# Patient Record
Sex: Male | Born: 1955 | Race: White | Hispanic: No | Smoking: Current every day smoker
Health system: Southern US, Community
[De-identification: ages and names within clinical notes are randomized; demographics above are authoritative.]

## PROBLEM LIST (undated history)

## (undated) DIAGNOSIS — K589 Irritable bowel syndrome without diarrhea: Secondary | ICD-10-CM

## (undated) DIAGNOSIS — F32A Depression, unspecified: Secondary | ICD-10-CM

## (undated) DIAGNOSIS — Z8719 Personal history of other diseases of the digestive system: Secondary | ICD-10-CM

## (undated) DIAGNOSIS — G4733 Obstructive sleep apnea (adult) (pediatric): Secondary | ICD-10-CM

## (undated) DIAGNOSIS — J45909 Unspecified asthma, uncomplicated: Secondary | ICD-10-CM

## (undated) DIAGNOSIS — M1990 Unspecified osteoarthritis, unspecified site: Secondary | ICD-10-CM

## (undated) DIAGNOSIS — F419 Anxiety disorder, unspecified: Secondary | ICD-10-CM

## (undated) DIAGNOSIS — E7800 Pure hypercholesterolemia, unspecified: Secondary | ICD-10-CM

## (undated) DIAGNOSIS — Z87442 Personal history of urinary calculi: Secondary | ICD-10-CM

## (undated) DIAGNOSIS — K219 Gastro-esophageal reflux disease without esophagitis: Secondary | ICD-10-CM

## (undated) HISTORY — DX: Anxiety disorder, unspecified: F41.9

## (undated) HISTORY — DX: Pure hypercholesterolemia, unspecified: E78.00

## (undated) HISTORY — DX: Gastro-esophageal reflux disease without esophagitis: K21.9

## (undated) HISTORY — PX: FEMUR FRACTURE SURGERY: SHX633

## (undated) HISTORY — DX: Obstructive sleep apnea (adult) (pediatric): G47.33

---

## 1994-05-19 HISTORY — PX: KIDNEY STONE SURGERY: SHX686

## 1996-05-19 HISTORY — PX: ANKLE SURGERY: SHX546

## 2012-05-21 HISTORY — PX: OTHER SURGICAL HISTORY: SHX169

## 2012-09-27 ENCOUNTER — Encounter: Payer: Self-pay | Admitting: Neurology

## 2012-09-27 ENCOUNTER — Ambulatory Visit: Payer: Self-pay | Admitting: Neurology

## 2018-03-09 ENCOUNTER — Other Ambulatory Visit: Payer: Self-pay

## 2018-03-09 DIAGNOSIS — R9431 Abnormal electrocardiogram [ECG] [EKG]: Secondary | ICD-10-CM

## 2018-06-23 DIAGNOSIS — J309 Allergic rhinitis, unspecified: Secondary | ICD-10-CM | POA: Diagnosis not present

## 2018-06-23 DIAGNOSIS — J0100 Acute maxillary sinusitis, unspecified: Secondary | ICD-10-CM | POA: Diagnosis not present

## 2018-06-26 DIAGNOSIS — R0789 Other chest pain: Secondary | ICD-10-CM | POA: Diagnosis not present

## 2018-06-26 DIAGNOSIS — I252 Old myocardial infarction: Secondary | ICD-10-CM | POA: Diagnosis not present

## 2018-06-26 DIAGNOSIS — E7800 Pure hypercholesterolemia, unspecified: Secondary | ICD-10-CM | POA: Diagnosis not present

## 2018-06-26 DIAGNOSIS — R079 Chest pain, unspecified: Secondary | ICD-10-CM | POA: Diagnosis not present

## 2018-06-26 DIAGNOSIS — K7689 Other specified diseases of liver: Secondary | ICD-10-CM | POA: Diagnosis not present

## 2018-06-26 DIAGNOSIS — F17210 Nicotine dependence, cigarettes, uncomplicated: Secondary | ICD-10-CM | POA: Diagnosis not present

## 2018-07-19 DIAGNOSIS — K140 Glossitis: Secondary | ICD-10-CM | POA: Diagnosis not present

## 2018-07-19 DIAGNOSIS — F17200 Nicotine dependence, unspecified, uncomplicated: Secondary | ICD-10-CM | POA: Diagnosis not present

## 2018-07-19 DIAGNOSIS — J342 Deviated nasal septum: Secondary | ICD-10-CM | POA: Diagnosis not present

## 2018-07-19 DIAGNOSIS — H6123 Impacted cerumen, bilateral: Secondary | ICD-10-CM | POA: Diagnosis not present

## 2018-12-02 DIAGNOSIS — Z125 Encounter for screening for malignant neoplasm of prostate: Secondary | ICD-10-CM | POA: Diagnosis not present

## 2018-12-02 DIAGNOSIS — Z9119 Patient's noncompliance with other medical treatment and regimen: Secondary | ICD-10-CM | POA: Diagnosis not present

## 2018-12-02 DIAGNOSIS — Z6841 Body Mass Index (BMI) 40.0 and over, adult: Secondary | ICD-10-CM | POA: Diagnosis not present

## 2018-12-02 DIAGNOSIS — I10 Essential (primary) hypertension: Secondary | ICD-10-CM | POA: Diagnosis not present

## 2018-12-02 DIAGNOSIS — Z79899 Other long term (current) drug therapy: Secondary | ICD-10-CM | POA: Diagnosis not present

## 2018-12-02 DIAGNOSIS — E785 Hyperlipidemia, unspecified: Secondary | ICD-10-CM | POA: Diagnosis not present

## 2018-12-02 DIAGNOSIS — R5383 Other fatigue: Secondary | ICD-10-CM | POA: Diagnosis not present

## 2019-02-22 DIAGNOSIS — L089 Local infection of the skin and subcutaneous tissue, unspecified: Secondary | ICD-10-CM | POA: Diagnosis not present

## 2019-02-22 DIAGNOSIS — L723 Sebaceous cyst: Secondary | ICD-10-CM | POA: Diagnosis not present

## 2019-02-22 DIAGNOSIS — K589 Irritable bowel syndrome without diarrhea: Secondary | ICD-10-CM | POA: Diagnosis not present

## 2019-02-22 DIAGNOSIS — Z6841 Body Mass Index (BMI) 40.0 and over, adult: Secondary | ICD-10-CM | POA: Diagnosis not present

## 2019-07-06 DIAGNOSIS — N3001 Acute cystitis with hematuria: Secondary | ICD-10-CM | POA: Diagnosis not present

## 2019-07-06 DIAGNOSIS — N3090 Cystitis, unspecified without hematuria: Secondary | ICD-10-CM | POA: Diagnosis not present

## 2019-08-22 DIAGNOSIS — Z6841 Body Mass Index (BMI) 40.0 and over, adult: Secondary | ICD-10-CM | POA: Diagnosis not present

## 2019-08-22 DIAGNOSIS — Z0000 Encounter for general adult medical examination without abnormal findings: Secondary | ICD-10-CM | POA: Diagnosis not present

## 2019-08-22 DIAGNOSIS — Z125 Encounter for screening for malignant neoplasm of prostate: Secondary | ICD-10-CM | POA: Diagnosis not present

## 2019-08-22 DIAGNOSIS — Z79899 Other long term (current) drug therapy: Secondary | ICD-10-CM | POA: Diagnosis not present

## 2019-08-25 DIAGNOSIS — E049 Nontoxic goiter, unspecified: Secondary | ICD-10-CM | POA: Diagnosis not present

## 2019-08-25 DIAGNOSIS — E041 Nontoxic single thyroid nodule: Secondary | ICD-10-CM | POA: Diagnosis not present

## 2019-08-25 DIAGNOSIS — Z1329 Encounter for screening for other suspected endocrine disorder: Secondary | ICD-10-CM | POA: Diagnosis not present

## 2019-09-20 DIAGNOSIS — E041 Nontoxic single thyroid nodule: Secondary | ICD-10-CM | POA: Diagnosis not present

## 2019-09-20 DIAGNOSIS — R739 Hyperglycemia, unspecified: Secondary | ICD-10-CM | POA: Diagnosis not present

## 2019-09-20 DIAGNOSIS — F419 Anxiety disorder, unspecified: Secondary | ICD-10-CM | POA: Diagnosis not present

## 2019-09-20 DIAGNOSIS — Z6841 Body Mass Index (BMI) 40.0 and over, adult: Secondary | ICD-10-CM | POA: Diagnosis not present

## 2019-10-25 DIAGNOSIS — F17210 Nicotine dependence, cigarettes, uncomplicated: Secondary | ICD-10-CM | POA: Diagnosis not present

## 2019-10-25 DIAGNOSIS — G4733 Obstructive sleep apnea (adult) (pediatric): Secondary | ICD-10-CM | POA: Diagnosis not present

## 2019-10-25 DIAGNOSIS — E662 Morbid (severe) obesity with alveolar hypoventilation: Secondary | ICD-10-CM | POA: Diagnosis not present

## 2019-10-25 DIAGNOSIS — R5383 Other fatigue: Secondary | ICD-10-CM | POA: Diagnosis not present

## 2019-11-06 DIAGNOSIS — G4733 Obstructive sleep apnea (adult) (pediatric): Secondary | ICD-10-CM | POA: Diagnosis not present

## 2019-11-09 DIAGNOSIS — F17210 Nicotine dependence, cigarettes, uncomplicated: Secondary | ICD-10-CM | POA: Diagnosis not present

## 2019-11-09 DIAGNOSIS — R5383 Other fatigue: Secondary | ICD-10-CM | POA: Diagnosis not present

## 2019-11-09 DIAGNOSIS — E662 Morbid (severe) obesity with alveolar hypoventilation: Secondary | ICD-10-CM | POA: Diagnosis not present

## 2019-11-09 DIAGNOSIS — G4733 Obstructive sleep apnea (adult) (pediatric): Secondary | ICD-10-CM | POA: Diagnosis not present

## 2019-12-05 DIAGNOSIS — G4733 Obstructive sleep apnea (adult) (pediatric): Secondary | ICD-10-CM | POA: Diagnosis not present

## 2019-12-22 DIAGNOSIS — R233 Spontaneous ecchymoses: Secondary | ICD-10-CM | POA: Diagnosis not present

## 2019-12-22 DIAGNOSIS — L821 Other seborrheic keratosis: Secondary | ICD-10-CM | POA: Diagnosis not present

## 2019-12-26 DIAGNOSIS — Z6841 Body Mass Index (BMI) 40.0 and over, adult: Secondary | ICD-10-CM | POA: Diagnosis not present

## 2019-12-26 DIAGNOSIS — K580 Irritable bowel syndrome with diarrhea: Secondary | ICD-10-CM | POA: Diagnosis not present

## 2019-12-26 DIAGNOSIS — R739 Hyperglycemia, unspecified: Secondary | ICD-10-CM | POA: Diagnosis not present

## 2019-12-30 DIAGNOSIS — G4733 Obstructive sleep apnea (adult) (pediatric): Secondary | ICD-10-CM | POA: Diagnosis not present

## 2020-01-05 DIAGNOSIS — G4733 Obstructive sleep apnea (adult) (pediatric): Secondary | ICD-10-CM | POA: Diagnosis not present

## 2020-01-16 DIAGNOSIS — R5383 Other fatigue: Secondary | ICD-10-CM | POA: Diagnosis not present

## 2020-01-16 DIAGNOSIS — E662 Morbid (severe) obesity with alveolar hypoventilation: Secondary | ICD-10-CM | POA: Diagnosis not present

## 2020-01-16 DIAGNOSIS — G4733 Obstructive sleep apnea (adult) (pediatric): Secondary | ICD-10-CM | POA: Diagnosis not present

## 2020-01-16 DIAGNOSIS — F17210 Nicotine dependence, cigarettes, uncomplicated: Secondary | ICD-10-CM | POA: Diagnosis not present

## 2020-01-16 DIAGNOSIS — J4520 Mild intermittent asthma, uncomplicated: Secondary | ICD-10-CM | POA: Diagnosis not present

## 2020-02-02 DIAGNOSIS — K5730 Diverticulosis of large intestine without perforation or abscess without bleeding: Secondary | ICD-10-CM | POA: Diagnosis not present

## 2020-02-05 DIAGNOSIS — G4733 Obstructive sleep apnea (adult) (pediatric): Secondary | ICD-10-CM | POA: Diagnosis not present

## 2020-02-24 DIAGNOSIS — R5383 Other fatigue: Secondary | ICD-10-CM | POA: Diagnosis not present

## 2020-02-24 DIAGNOSIS — E662 Morbid (severe) obesity with alveolar hypoventilation: Secondary | ICD-10-CM | POA: Diagnosis not present

## 2020-02-24 DIAGNOSIS — F17210 Nicotine dependence, cigarettes, uncomplicated: Secondary | ICD-10-CM | POA: Diagnosis not present

## 2020-02-24 DIAGNOSIS — R059 Cough, unspecified: Secondary | ICD-10-CM | POA: Diagnosis not present

## 2020-02-24 DIAGNOSIS — G4733 Obstructive sleep apnea (adult) (pediatric): Secondary | ICD-10-CM | POA: Diagnosis not present

## 2020-02-24 DIAGNOSIS — J4520 Mild intermittent asthma, uncomplicated: Secondary | ICD-10-CM | POA: Diagnosis not present

## 2020-03-06 DIAGNOSIS — G4733 Obstructive sleep apnea (adult) (pediatric): Secondary | ICD-10-CM | POA: Diagnosis not present

## 2020-03-15 DIAGNOSIS — K5730 Diverticulosis of large intestine without perforation or abscess without bleeding: Secondary | ICD-10-CM | POA: Diagnosis not present

## 2020-03-28 DIAGNOSIS — R059 Cough, unspecified: Secondary | ICD-10-CM | POA: Diagnosis not present

## 2020-04-06 DIAGNOSIS — G4733 Obstructive sleep apnea (adult) (pediatric): Secondary | ICD-10-CM | POA: Diagnosis not present

## 2020-04-09 DIAGNOSIS — Z7189 Other specified counseling: Secondary | ICD-10-CM | POA: Diagnosis not present

## 2020-04-09 DIAGNOSIS — F17210 Nicotine dependence, cigarettes, uncomplicated: Secondary | ICD-10-CM | POA: Diagnosis not present

## 2020-04-09 DIAGNOSIS — E662 Morbid (severe) obesity with alveolar hypoventilation: Secondary | ICD-10-CM | POA: Diagnosis not present

## 2020-04-09 DIAGNOSIS — G4733 Obstructive sleep apnea (adult) (pediatric): Secondary | ICD-10-CM | POA: Diagnosis not present

## 2020-04-09 DIAGNOSIS — J4520 Mild intermittent asthma, uncomplicated: Secondary | ICD-10-CM | POA: Diagnosis not present

## 2020-04-09 DIAGNOSIS — R5383 Other fatigue: Secondary | ICD-10-CM | POA: Diagnosis not present

## 2020-05-06 DIAGNOSIS — G4733 Obstructive sleep apnea (adult) (pediatric): Secondary | ICD-10-CM | POA: Diagnosis not present

## 2020-05-22 DIAGNOSIS — G4733 Obstructive sleep apnea (adult) (pediatric): Secondary | ICD-10-CM | POA: Diagnosis not present

## 2020-05-22 DIAGNOSIS — F17210 Nicotine dependence, cigarettes, uncomplicated: Secondary | ICD-10-CM | POA: Diagnosis not present

## 2020-05-22 DIAGNOSIS — J4540 Moderate persistent asthma, uncomplicated: Secondary | ICD-10-CM | POA: Diagnosis not present

## 2020-05-22 DIAGNOSIS — E662 Morbid (severe) obesity with alveolar hypoventilation: Secondary | ICD-10-CM | POA: Diagnosis not present

## 2020-05-22 DIAGNOSIS — R5383 Other fatigue: Secondary | ICD-10-CM | POA: Diagnosis not present

## 2020-05-22 DIAGNOSIS — Z7189 Other specified counseling: Secondary | ICD-10-CM | POA: Diagnosis not present

## 2020-06-06 DIAGNOSIS — G4733 Obstructive sleep apnea (adult) (pediatric): Secondary | ICD-10-CM | POA: Diagnosis not present

## 2020-07-07 DIAGNOSIS — G4733 Obstructive sleep apnea (adult) (pediatric): Secondary | ICD-10-CM | POA: Diagnosis not present

## 2020-07-17 DIAGNOSIS — C9030 Solitary plasmacytoma not having achieved remission: Secondary | ICD-10-CM

## 2020-07-17 HISTORY — DX: Solitary plasmacytoma not having achieved remission: C90.30

## 2020-08-04 DIAGNOSIS — G4733 Obstructive sleep apnea (adult) (pediatric): Secondary | ICD-10-CM | POA: Diagnosis not present

## 2020-08-17 DIAGNOSIS — S39012A Strain of muscle, fascia and tendon of lower back, initial encounter: Secondary | ICD-10-CM | POA: Diagnosis not present

## 2020-08-17 DIAGNOSIS — Z6841 Body Mass Index (BMI) 40.0 and over, adult: Secondary | ICD-10-CM | POA: Diagnosis not present

## 2020-08-21 DIAGNOSIS — F17210 Nicotine dependence, cigarettes, uncomplicated: Secondary | ICD-10-CM | POA: Diagnosis not present

## 2020-08-21 DIAGNOSIS — M545 Low back pain, unspecified: Secondary | ICD-10-CM | POA: Diagnosis not present

## 2020-08-21 DIAGNOSIS — Z7952 Long term (current) use of systemic steroids: Secondary | ICD-10-CM | POA: Diagnosis not present

## 2020-08-21 DIAGNOSIS — E876 Hypokalemia: Secondary | ICD-10-CM | POA: Diagnosis not present

## 2020-08-21 DIAGNOSIS — Z79891 Long term (current) use of opiate analgesic: Secondary | ICD-10-CM | POA: Diagnosis not present

## 2020-08-21 DIAGNOSIS — N390 Urinary tract infection, site not specified: Secondary | ICD-10-CM | POA: Diagnosis not present

## 2020-08-21 DIAGNOSIS — X58XXXA Exposure to other specified factors, initial encounter: Secondary | ICD-10-CM | POA: Diagnosis not present

## 2020-08-21 DIAGNOSIS — E785 Hyperlipidemia, unspecified: Secondary | ICD-10-CM | POA: Diagnosis not present

## 2020-08-21 DIAGNOSIS — S3992XA Unspecified injury of lower back, initial encounter: Secondary | ICD-10-CM | POA: Diagnosis not present

## 2020-08-21 DIAGNOSIS — Z79899 Other long term (current) drug therapy: Secondary | ICD-10-CM | POA: Diagnosis not present

## 2020-08-21 DIAGNOSIS — S39012A Strain of muscle, fascia and tendon of lower back, initial encounter: Secondary | ICD-10-CM | POA: Diagnosis not present

## 2020-08-21 DIAGNOSIS — J45909 Unspecified asthma, uncomplicated: Secondary | ICD-10-CM | POA: Diagnosis not present

## 2020-08-23 DIAGNOSIS — M47896 Other spondylosis, lumbar region: Secondary | ICD-10-CM | POA: Diagnosis not present

## 2020-08-23 DIAGNOSIS — M545 Low back pain, unspecified: Secondary | ICD-10-CM | POA: Diagnosis not present

## 2020-08-23 DIAGNOSIS — M7918 Myalgia, other site: Secondary | ICD-10-CM | POA: Diagnosis not present

## 2020-08-29 DIAGNOSIS — R351 Nocturia: Secondary | ICD-10-CM | POA: Diagnosis not present

## 2020-08-29 DIAGNOSIS — R3121 Asymptomatic microscopic hematuria: Secondary | ICD-10-CM | POA: Diagnosis not present

## 2020-08-29 DIAGNOSIS — R350 Frequency of micturition: Secondary | ICD-10-CM | POA: Diagnosis not present

## 2020-09-04 DIAGNOSIS — G4733 Obstructive sleep apnea (adult) (pediatric): Secondary | ICD-10-CM | POA: Diagnosis not present

## 2020-09-07 DIAGNOSIS — N281 Cyst of kidney, acquired: Secondary | ICD-10-CM | POA: Diagnosis not present

## 2020-09-07 DIAGNOSIS — R3121 Asymptomatic microscopic hematuria: Secondary | ICD-10-CM | POA: Diagnosis not present

## 2020-09-07 DIAGNOSIS — N200 Calculus of kidney: Secondary | ICD-10-CM | POA: Diagnosis not present

## 2020-09-07 DIAGNOSIS — K5730 Diverticulosis of large intestine without perforation or abscess without bleeding: Secondary | ICD-10-CM | POA: Diagnosis not present

## 2020-09-07 DIAGNOSIS — K760 Fatty (change of) liver, not elsewhere classified: Secondary | ICD-10-CM | POA: Diagnosis not present

## 2020-09-10 ENCOUNTER — Other Ambulatory Visit: Payer: Self-pay | Admitting: Urology

## 2020-09-10 ENCOUNTER — Other Ambulatory Visit (HOSPITAL_COMMUNITY): Payer: Self-pay | Admitting: Urology

## 2020-09-10 ENCOUNTER — Ambulatory Visit (HOSPITAL_COMMUNITY)
Admission: RE | Admit: 2020-09-10 | Discharge: 2020-09-10 | Disposition: A | Discharge status: Home / Self Care | Payer: BC Managed Care – PPO | Source: Ambulatory Visit | Attending: Urology | Admitting: Urology

## 2020-09-10 ENCOUNTER — Other Ambulatory Visit: Payer: Self-pay

## 2020-09-10 DIAGNOSIS — M4804 Spinal stenosis, thoracic region: Secondary | ICD-10-CM | POA: Diagnosis not present

## 2020-09-10 DIAGNOSIS — G9520 Unspecified cord compression: Secondary | ICD-10-CM | POA: Diagnosis not present

## 2020-09-10 DIAGNOSIS — R9389 Abnormal findings on diagnostic imaging of other specified body structures: Secondary | ICD-10-CM

## 2020-09-10 DIAGNOSIS — M8448XA Pathological fracture, other site, initial encounter for fracture: Secondary | ICD-10-CM | POA: Diagnosis not present

## 2020-09-10 DIAGNOSIS — M5124 Other intervertebral disc displacement, thoracic region: Secondary | ICD-10-CM | POA: Diagnosis not present

## 2020-09-10 IMAGING — MR MR THORACIC SPINE WO/W CM
8 of 9 series · 31 of 48 positions shown · IV contrast (gadavist)
Comparison: CT abdomen and pelvis [DATE]

CLINICAL DATA: Abnormal CT demonstrating a destructive T11 lesion.

EXAM:
MRI THORACIC WITHOUT AND WITH CONTRAST
TECHNIQUE: Multiplanar and multiecho pulse sequences of the thoracic spine were
obtained without and with intravenous contrast.
CONTRAST:  10mL GADAVIST GADOBUTROL 1 MMOL/ML IV SOLN

[Series 16: T1 · sagittal · 4.0mm · 1.72mm/px · 1 of 5 slices shown (1 of 3)]
[im 1/5]
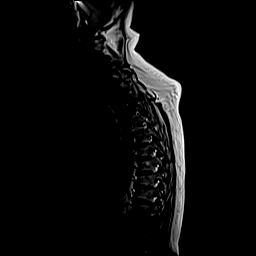

[Series 17: STIR · sagittal · 3.0mm · 1.00mm/px · 2 of 15 slices shown]
[im 1/15]
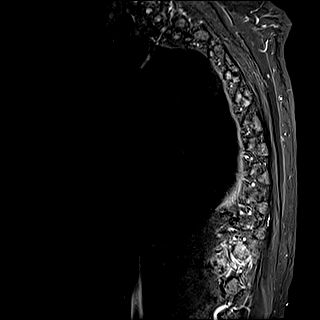
[im 15/15]
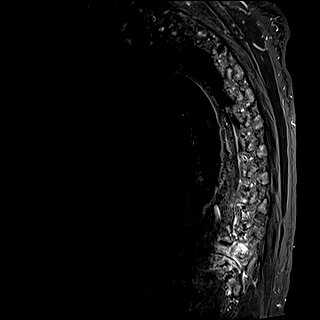

[Series 18: T1 · sagittal · 3.0mm · 1.00mm/px · 3 of 15 slices shown (2 of 3)]
[im 1/15]
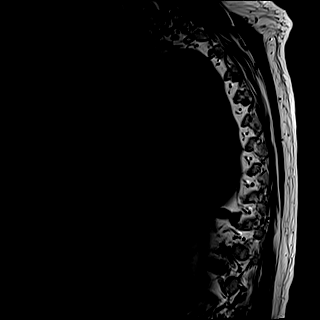
[im 8/15]
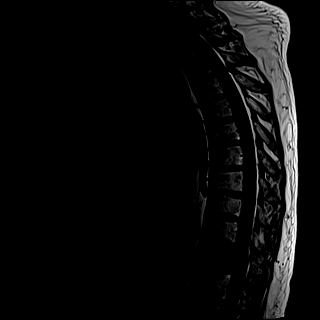
[im 15/15]
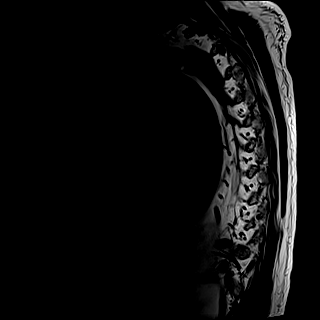

[Series 19: T2 · axial · 4.0mm · 0.78mm/px · z∈[-271,-89]mm · 9 of 40 slices shown]
[im 1/40]
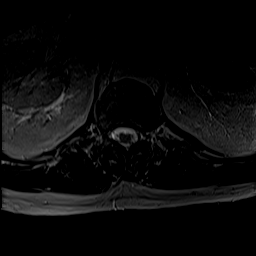
[im 5/40]
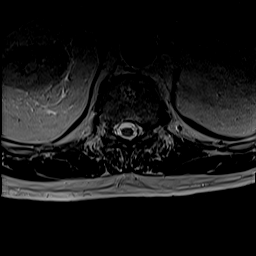
[im 10/40]
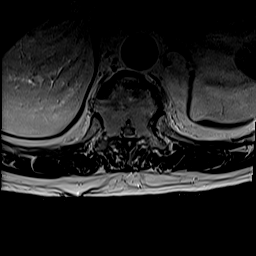
[im 15/40]
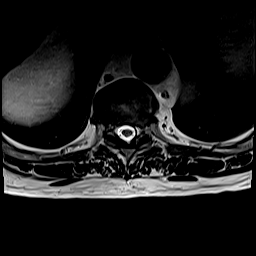
[im 20/40]
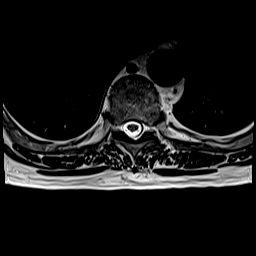
[im 25/40]
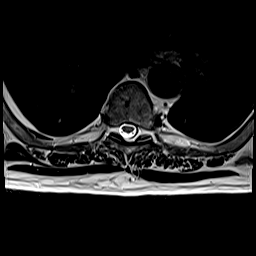
[im 30/40]
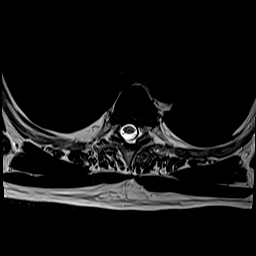
[im 35/40]
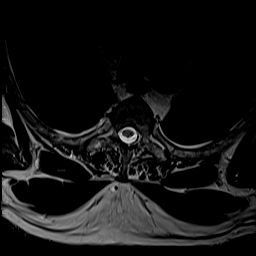
[im 40/40]
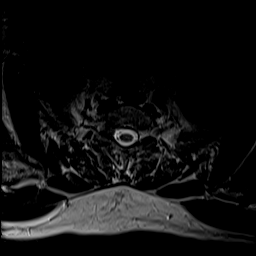

[Series 21: T1 · axial · 4.0mm · 0.39mm/px · z∈[-271,-89]mm · 8 of 40 slices shown (3 of 3)]
[im 1/40]
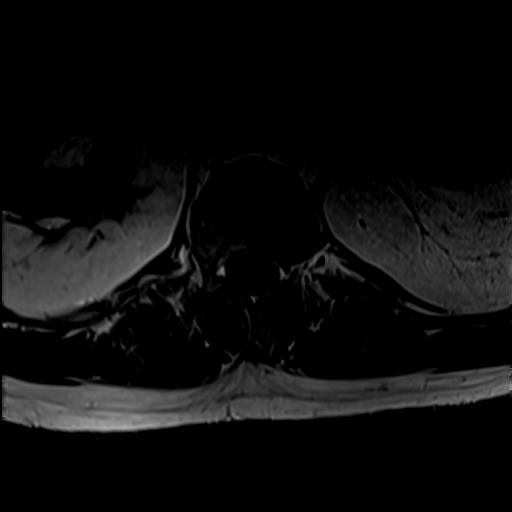
[im 5/40]
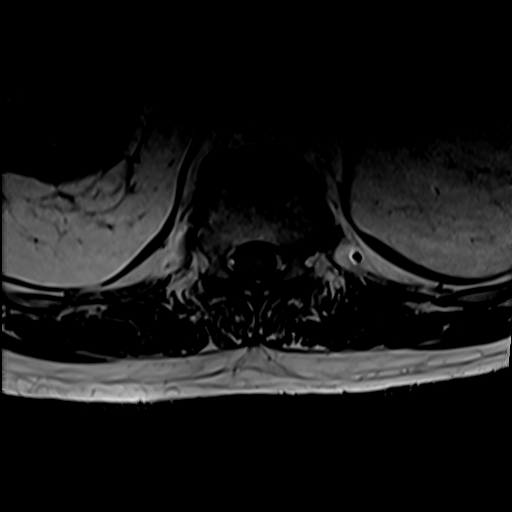
[im 10/40]
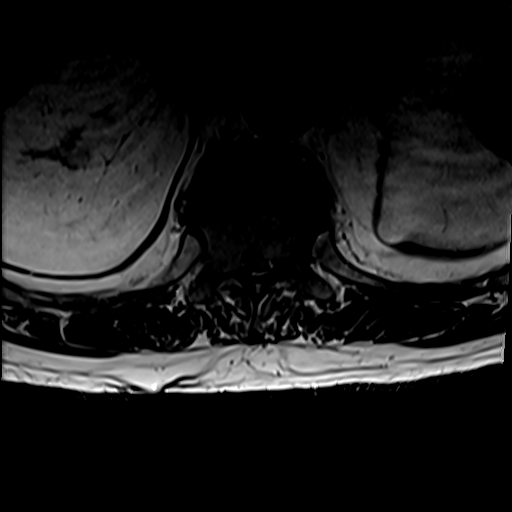
[im 15/40]
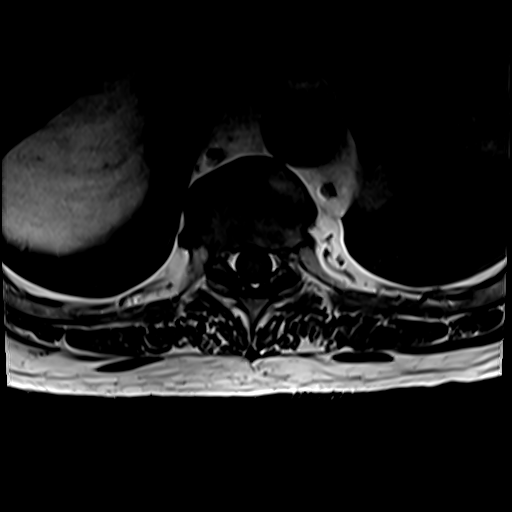
[im 25/40]
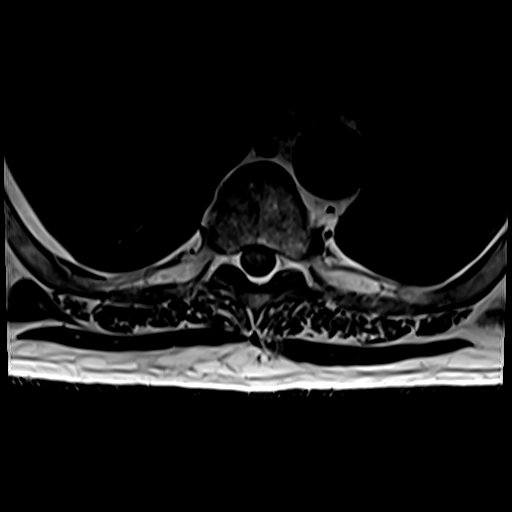
[im 30/40]
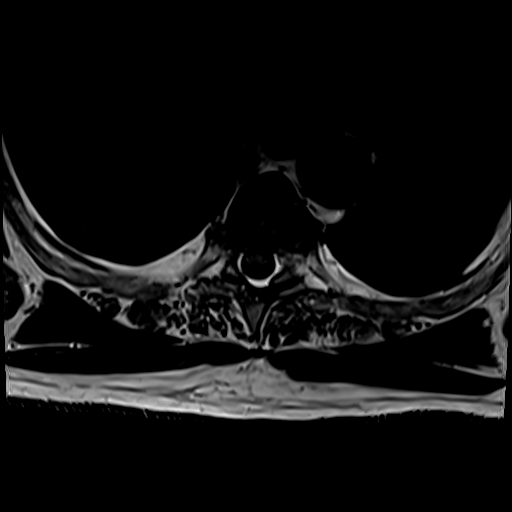
[im 35/40]
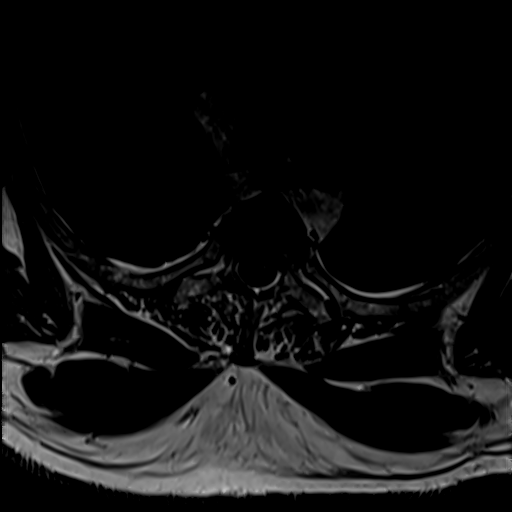
[im 40/40]
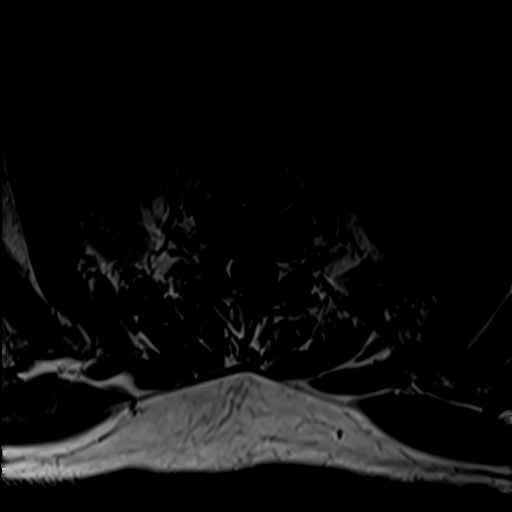

[Series 22: T2 post-contrast · sagittal · 3.0mm · 0.83mm/px · 3 of 15 slices shown]
[im 1/15]
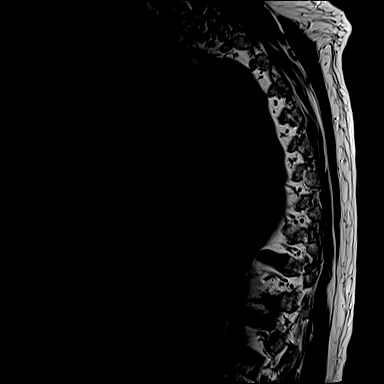
[im 8/15]
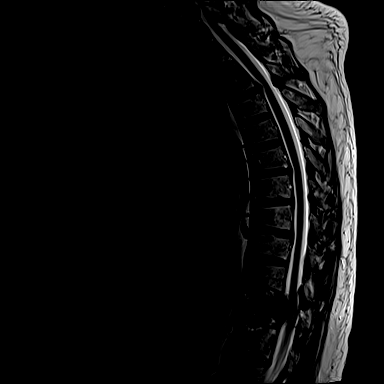
[im 15/15]
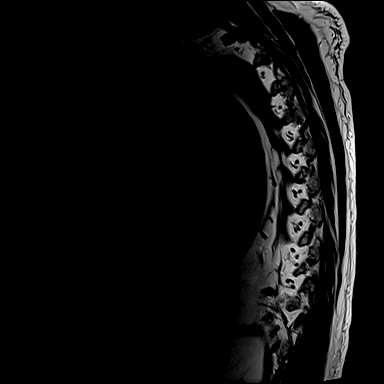

[Series 23: T1 fat-sat post-contrast · sagittal · 3.0mm · 1.00mm/px · 3 of 15 slices shown]
[im 1/15]
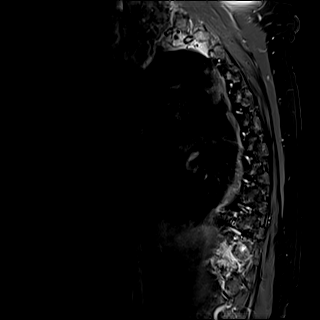
[im 8/15]
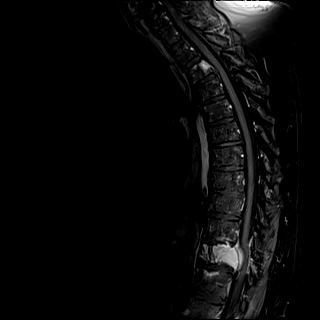
[im 15/15]
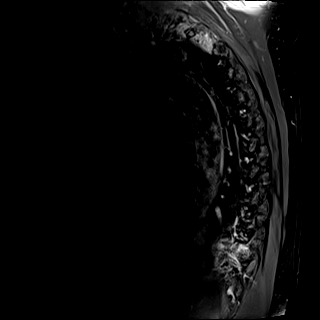

[Series 24: T1 post-contrast · axial · 4.0mm · 0.39mm/px · z∈[-271,-250]mm · 2 of 40 slices shown]
[im 1/40]
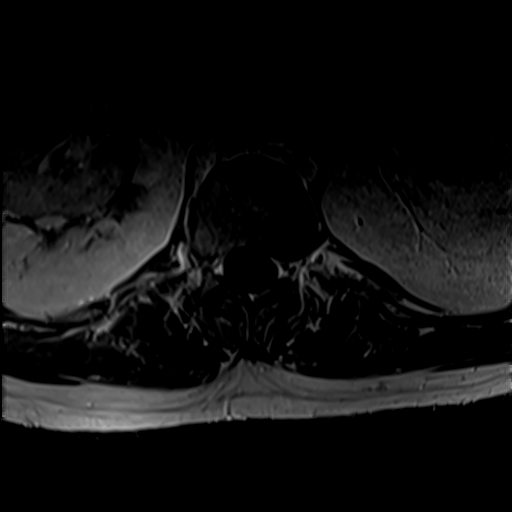
[im 5/40]
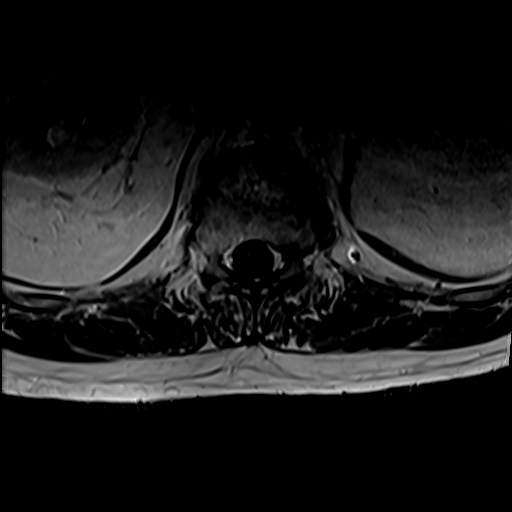

[31 of 48 positions shown; findings below may reference images not displayed]

FINDINGS: Alignment:  Normal.

Vertebrae: As seen on the recent abdominal CT, there is a large
destructive lesion involving the T11 vertebral body and posterior
elements bilaterally with pathologic vertebral body fracture
demonstrating mild height loss. The lesion demonstrates largely
uniform T2 hyperintensity and avid enhancement although there are
some vertical low signal intensity striations anteriorly. There is
prominent epidural tumor ventrally and laterally in the spinal canal
resulting in severe spinal stenosis and mild spinal cord
compression. Smaller enhancing lesions are present in the T3, T10,
T12, and L1 vertebral bodies and in the right-sided T8 posterior
elements.

Cord: Questionable faint T2 hyperintensity in the spinal cord at
T11. Normal cord signal elsewhere.

Paraspinal and other soft tissues: Unremarkable.

Disc levels:

Limited visualization of the cervical spine on sagittal imaging
demonstrates disc bulging at C6-7 resulting in mild spinal stenosis
and suspected moderate bilateral neural foraminal stenosis. A small
central disc protrusion at T3-4 indents the ventral spinal cord
without significant spinal stenosis. Shallow central disc
protrusions at T6-7, T7-8, and T8-9 do not result in significant
spinal cord mass effect or stenosis. T11 tumor encroaches upon the
T11-12 greater than T10-11 neural foramina bilaterally.
IMPRESSION: 1. Large destructive lesion involving the T11 vertebral body and
posterior elements with epidural tumor resulting in severe spinal
stenosis and mild cord compression with possible faint cord edema.
2. Additional smaller enhancing lesions in the thoracic and upper
lumbar spine.
3. Given the appearance of the dominant T11 mass, multiple myeloma
is favored over metastatic disease.

## 2020-09-10 MED ORDER — GADOBUTROL 1 MMOL/ML IV SOLN
10.0000 mL | Freq: Once | INTRAVENOUS | Status: AC | PRN
  Administered 2020-09-10: 10 mL via INTRAVENOUS

## 2020-09-11 DIAGNOSIS — I10 Essential (primary) hypertension: Secondary | ICD-10-CM | POA: Insufficient documentation

## 2020-09-11 DIAGNOSIS — C9030 Solitary plasmacytoma not having achieved remission: Secondary | ICD-10-CM | POA: Diagnosis not present

## 2020-09-13 ENCOUNTER — Other Ambulatory Visit (HOSPITAL_COMMUNITY): Payer: Self-pay | Admitting: Neurosurgery

## 2020-09-13 DIAGNOSIS — C9030 Solitary plasmacytoma not having achieved remission: Secondary | ICD-10-CM

## 2020-09-19 ENCOUNTER — Ambulatory Visit (HOSPITAL_COMMUNITY)
Admission: RE | Admit: 2020-09-19 | Discharge: 2020-09-19 | Disposition: A | Discharge status: Home / Self Care | Payer: BC Managed Care – PPO | Source: Ambulatory Visit | Attending: Neurosurgery | Admitting: Neurosurgery

## 2020-09-19 ENCOUNTER — Other Ambulatory Visit: Payer: Self-pay

## 2020-09-19 DIAGNOSIS — M4802 Spinal stenosis, cervical region: Secondary | ICD-10-CM | POA: Diagnosis not present

## 2020-09-19 DIAGNOSIS — M47812 Spondylosis without myelopathy or radiculopathy, cervical region: Secondary | ICD-10-CM | POA: Diagnosis not present

## 2020-09-19 DIAGNOSIS — C9030 Solitary plasmacytoma not having achieved remission: Secondary | ICD-10-CM

## 2020-09-19 DIAGNOSIS — M5021 Other cervical disc displacement,  high cervical region: Secondary | ICD-10-CM | POA: Diagnosis not present

## 2020-09-19 DIAGNOSIS — M50223 Other cervical disc displacement at C6-C7 level: Secondary | ICD-10-CM | POA: Diagnosis not present

## 2020-09-19 IMAGING — MR MR CERVICAL SPINE WO/W CM
6 of 8 series · 32 of 48 positions shown · IV contrast (gadavist)
Comparison: MRI of the thoracic spine [DATE]

CLINICAL DATA: Solitary plasmacytoma not having achieved remission.

EXAM:
MRI CERVICAL AND LUMBAR SPINE WITHOUT AND WITH CONTRAST
TECHNIQUE: Multiplanar and multiecho pulse sequences of the cervical spine, to
include the craniocervical junction and cervicothoracic junction,
and lumbar spine, were obtained without and with intravenous
contrast.
CONTRAST:  10mL GADAVIST GADOBUTROL 1 MMOL/ML IV SOLN

[Series 5: T1 · sagittal · 3.0mm · 0.69mm/px · 4 of 15 slices shown (1 of 2)]
[im 1/15]
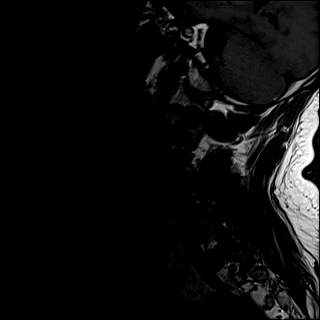
[im 5/15]
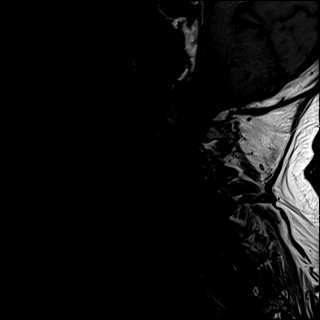
[im 10/15]
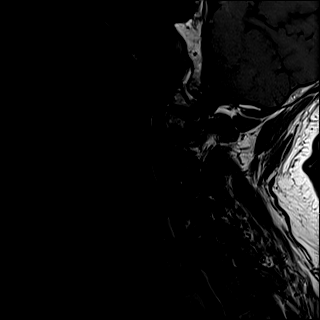
[im 15/15]
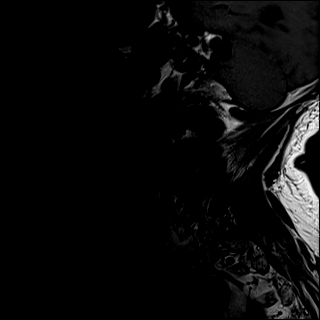

[Series 6: STIR · sagittal · 3.0mm · 0.86mm/px · 4 of 15 slices shown]
[im 1/15]
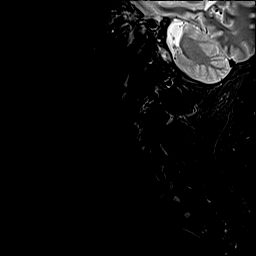
[im 5/15]
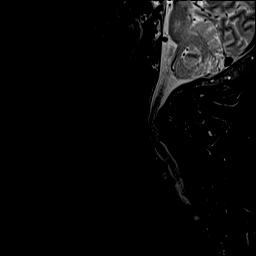
[im 10/15]
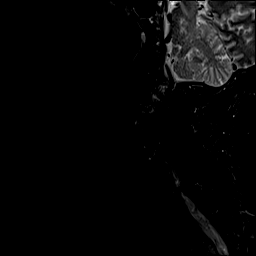
[im 15/15]
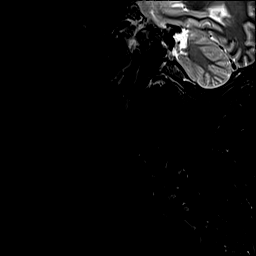

[Series 7: T2 · sagittal · 3.0mm · 0.69mm/px · 4 of 15 slices shown (1 of 2)]
[im 1/15]
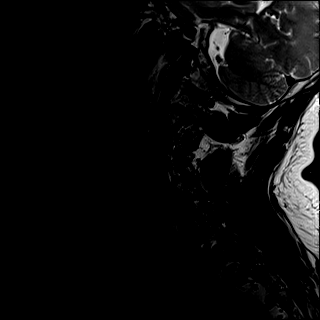
[im 5/15]
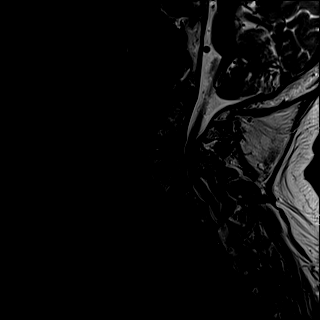
[im 10/15]
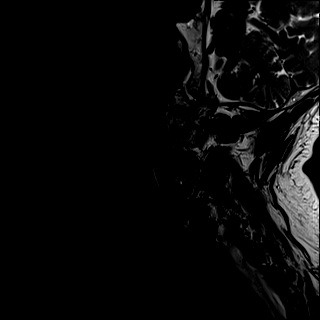
[im 15/15]
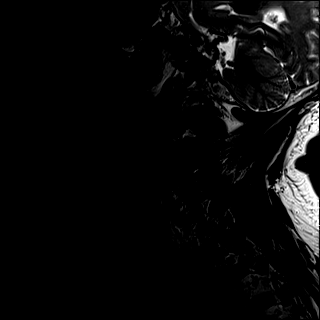

[Series 8: T2 · axial · 3.0mm · 0.70mm/px · z∈[-61,+35]mm · 8 of 33 slices shown (2 of 2)]
[im 1/33]
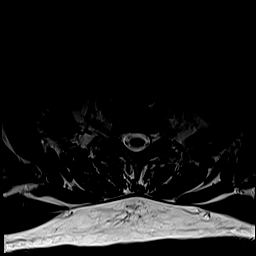
[im 5/33]
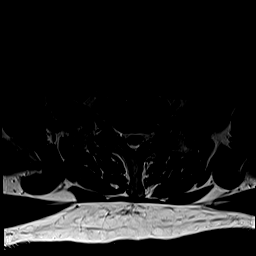
[im 10/33]
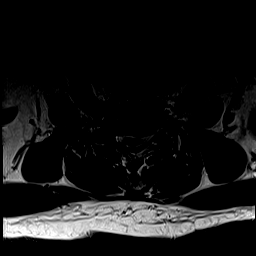
[im 14/33]
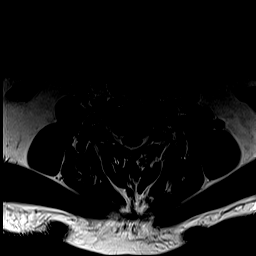
[im 19/33]
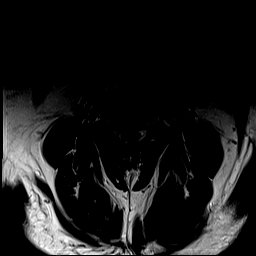
[im 23/33]
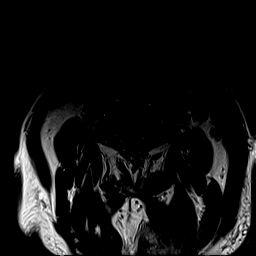
[im 28/33]
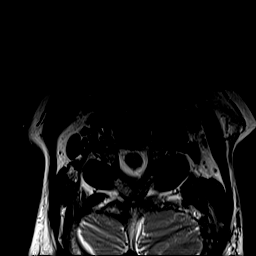
[im 33/33]
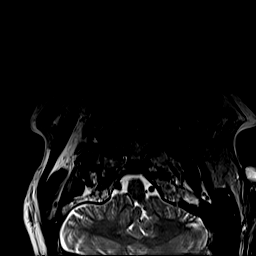

[Series 10: T1 · axial · 3.0mm · 0.35mm/px · z∈[-61,+35]mm · 8 of 33 slices shown (2 of 2)]
[im 1/33]
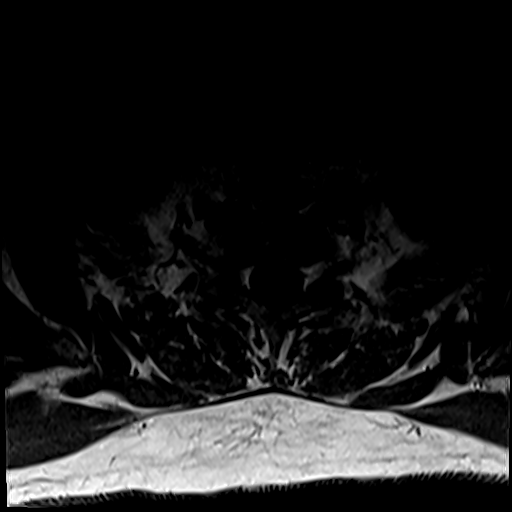
[im 5/33]
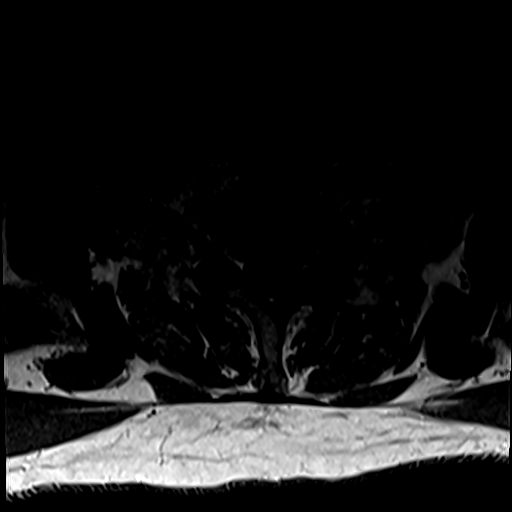
[im 10/33]
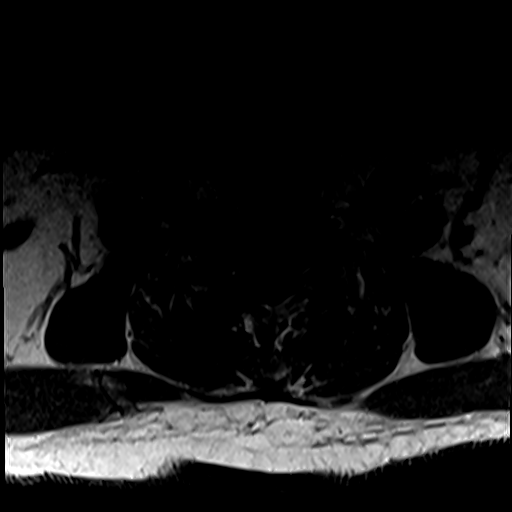
[im 14/33]
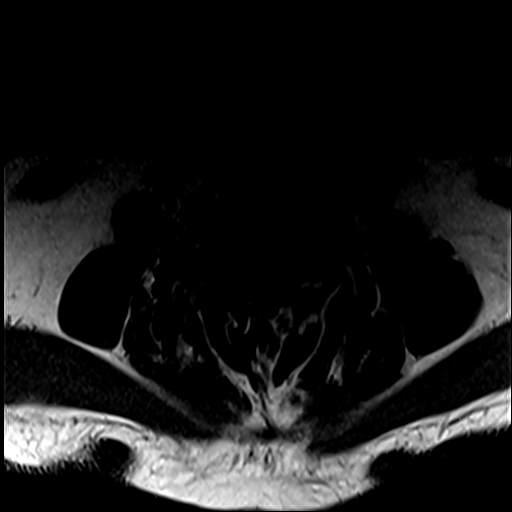
[im 19/33]
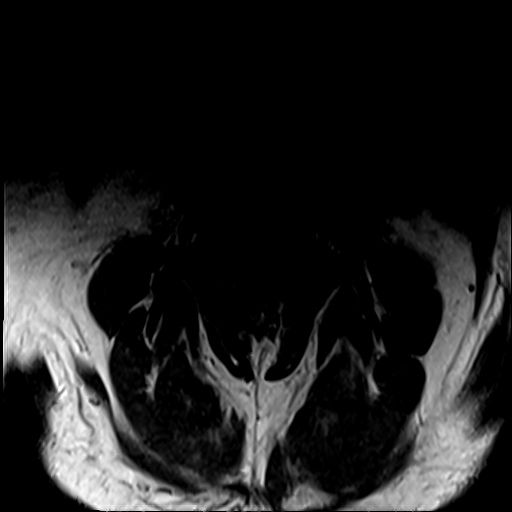
[im 23/33]
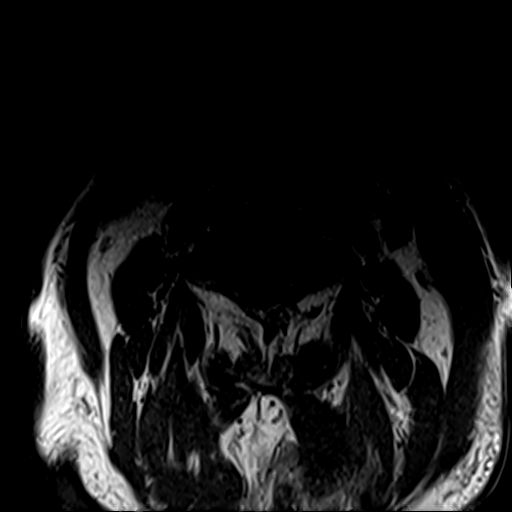
[im 28/33]
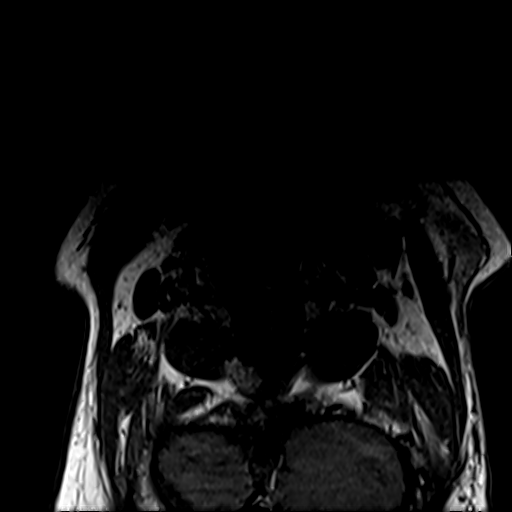
[im 33/33]
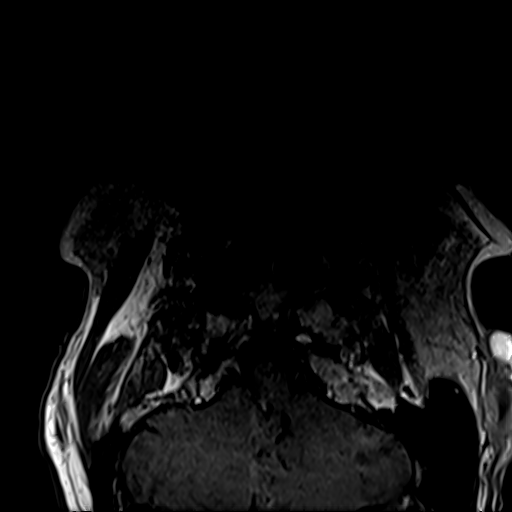

[Series 11: T1 fat-sat post-contrast · sagittal · 3.0mm · 0.69mm/px · 4 of 15 slices shown]
[im 1/15]
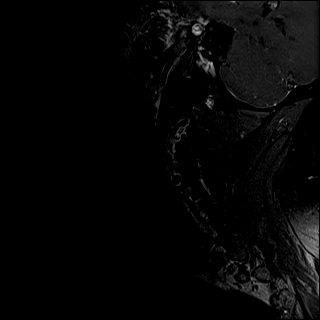
[im 5/15]
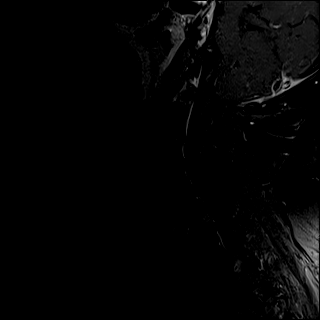
[im 10/15]
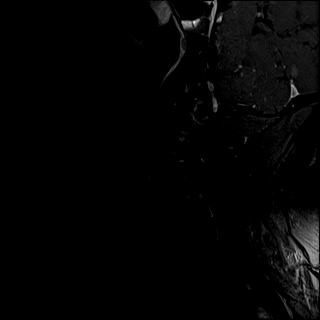
[im 15/15]
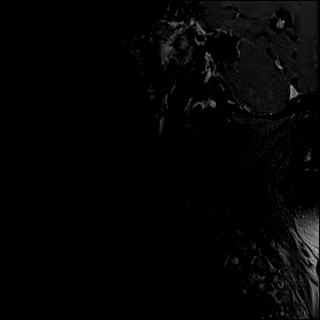

[32 of 48 positions shown; findings below may reference images not displayed]

FINDINGS: MRI CERVICAL SPINE FINDINGS

Alignment: Straightening of the cervical curvature with mild
reversal at the C5-6 level. Small anterolisthesis of C7 over T1.

Vertebrae: No fracture, evidence of discitis, or bone lesion. No
focus of abnormal contrast enhancement.

Cord: Flattening of the anterior cord contour from C3-4 through
C6-7.

Posterior Fossa, vertebral arteries, paraspinal tissues: T2
hyperintensity within the pons, most likely related to chronic
microangiopathy.

Disc levels:

C2-3: Posterior disc protrusion resulting in mild to moderate spinal
canal stenosis. No significant neural foraminal stenosis.

C3-4: Posterior disc protrusion resulting in moderate spinal canal
stenosis. Uncovertebral and facet degenerative changes resulting
severe bilateral neural foraminal narrowing.

C4-5: Posterior disc protrusion resulting in mild to moderate spinal
canal stenosis. Uncovertebral and facet degenerative changes
resulting in mild-to-moderate bilateral neural foraminal narrowing.

C5-6: Posterior disc protrusion resulting in mild spinal canal
stenosis. Uncovertebral and facet degenerative changes resulting in
moderate right and severe left neural foraminal narrowing.

C6-7: Posterior disc protrusion resulting in mild spinal canal
stenosis. Uncovertebral and facet degenerative changes resulting in
moderate bilateral neural foraminal.

C7-T1: Small posterior disc protrusion without significant spinal
canal stenosis. Facet degenerative changes resulting in mild
bilateral neural foraminal narrowing.

MRI LUMBAR SPINE FINDINGS

Segmentation:  Standard.

Alignment:  Physiologic.

Vertebrae: Again seen is large destructive lesion of the T11
vertebral body and extending to the posterior elements with
retropulsion resulting in spinal canal stenosis and compression of
the cord at this level. This is only evaluated on sagittal images
and appear stable when compared to recent dedicated MRI of the
thoracic spine. Small enhancing lesions are seen at T12 and L1, also
stable compared to prior MRI. No other suspicious enhancing lesion
identified.

Conus medullaris and cauda equina: Conus extends to the L2 level.
Conus and cauda equina appear normal. Lower thoracic cord
compression, as described above.

Paraspinal and other soft tissues: Left renal cyst.

Disc levels:

T12-L1: No spinal canal or neural foraminal stenosis.

L1-2: Mild facet degenerative changes. No spinal canal or neural
foraminal stenosis.

L2-3: Mild facet degenerative changes. No spinal canal or neural
foraminal stenosis.

L3-4: Shallow disc bulge and moderate facet degenerative changes. No
spinal canal or neural foraminal stenosis.

L4-5: Shallow disc bulge and advanced hypertrophic facet
degenerative changes, right greater than left without significant
spinal canal or neural foraminal stenosis.

L5-S1: Shallow disc bulge and moderate facet degenerative changes
without significant spinal canal or neural foraminal stenosis.
IMPRESSION: 1. No significant interval change of the destructive lesion
involving the T11 vertebra which is only partially assessed in the
current study. Small enhancing lesions at T12 and L1 also appear
unchanged. No additional enhancing lesion identified in the cervical
or lumbar spine.
2. Degenerative changes of the cervical spine with moderate spinal
canal stenosis at C3-4 with mild mass effect on the cord from C2-3
through C5-6 without cord signal abnormality.
3. Multilevel high-grade neural foraminal narrowing the cervical
spine, severe bilaterally at C3-4 and on the left at C5-6.
4. Degenerative changes of the lumbar spine, mostly at the facet
joints, more pronounced at L4-5. No significant spinal canal or
neural foraminal stenosis of the lumbar spine.

## 2020-09-19 IMAGING — MR MR LUMBAR SPINE WO/W CM
5 of 7 series · 29 of 48 positions shown · IV contrast (10 GADAVIST)
Comparison: MRI of the thoracic spine [DATE]

CLINICAL DATA: Solitary plasmacytoma not having achieved remission.

EXAM:
MRI CERVICAL AND LUMBAR SPINE WITHOUT AND WITH CONTRAST
TECHNIQUE: Multiplanar and multiecho pulse sequences of the cervical spine, to
include the craniocervical junction and cervicothoracic junction,
and lumbar spine, were obtained without and with intravenous
contrast.
CONTRAST:  10mL GADAVIST GADOBUTROL 1 MMOL/ML IV SOLN

[Series 5: T1 · sagittal · 4.0mm · 0.84mm/px · 5 of 17 slices shown (1 of 2)]
[im 1/17]
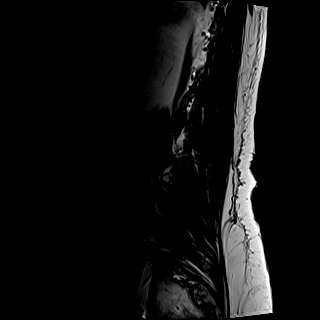
[im 5/17]
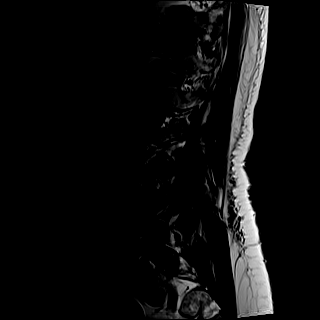
[im 9/17]
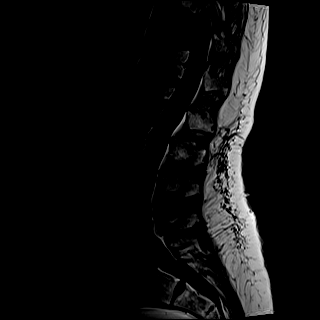
[im 13/17]
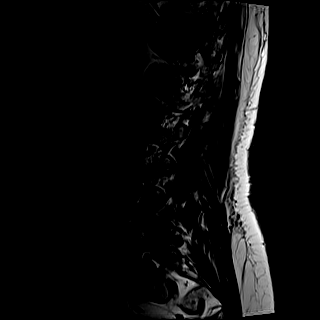
[im 17/17]
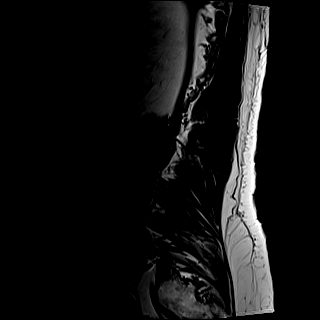

[Series 7: T2 · axial · 4.0mm · 0.62mm/px · z∈[-484,-253]mm · 8 of 42 slices shown]
[im 1/42]
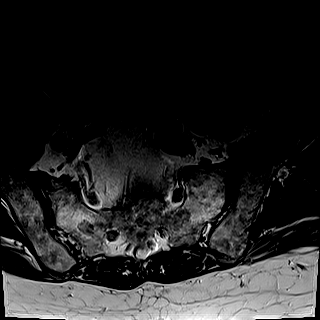
[im 5/42]
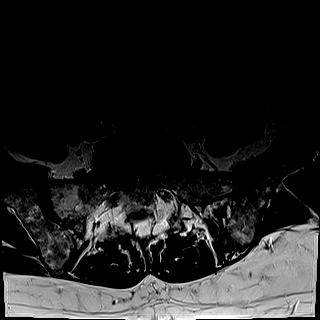
[im 14/42]
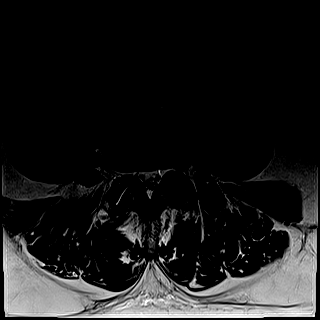
[im 19/42]
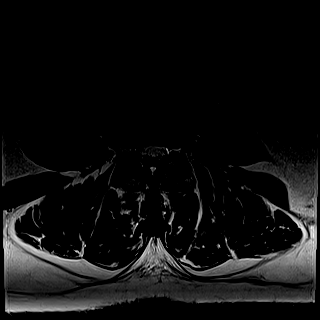
[im 23/42]
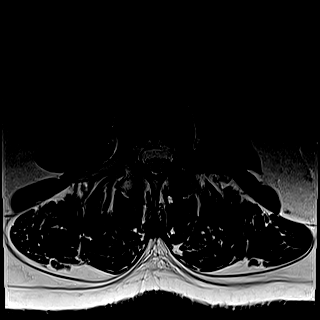
[im 28/42]
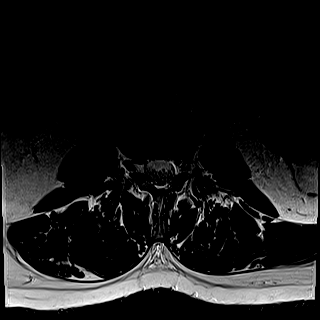
[im 37/42]
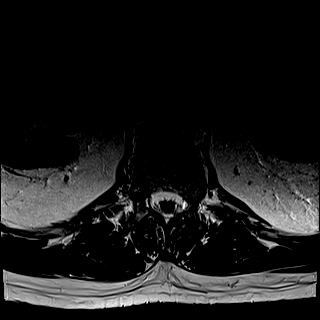
[im 42/42]
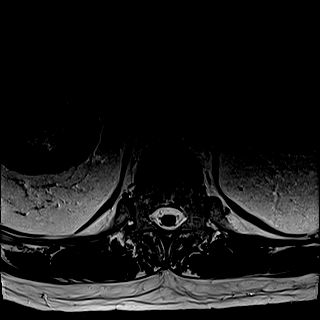

[Series 8: T1 · axial · 4.0mm · 0.39mm/px · z∈[-484,-253]mm · 8 of 42 slices shown (2 of 2)]
[im 1/42]
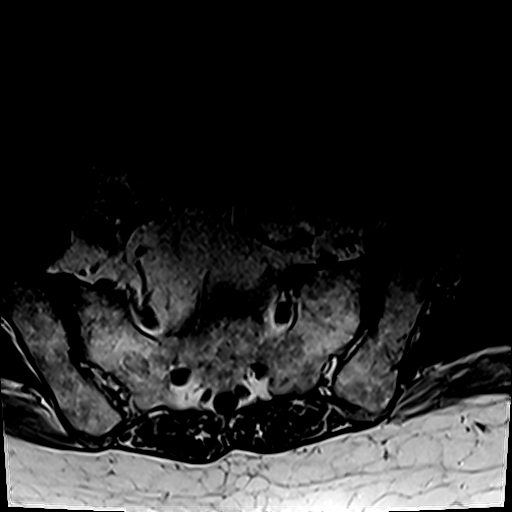
[im 5/42]
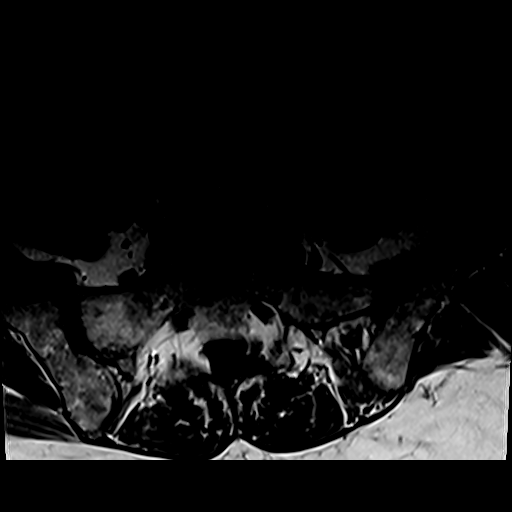
[im 14/42]
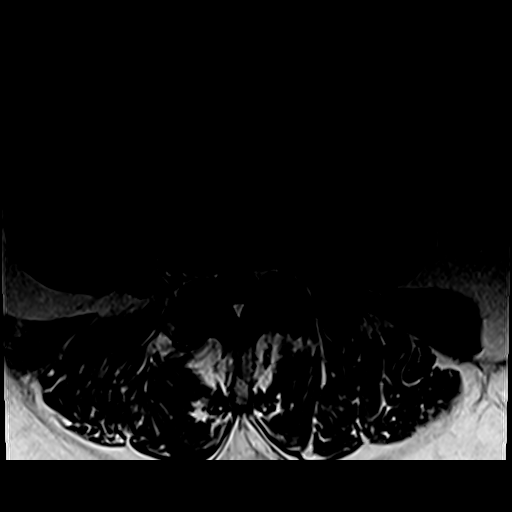
[im 19/42]
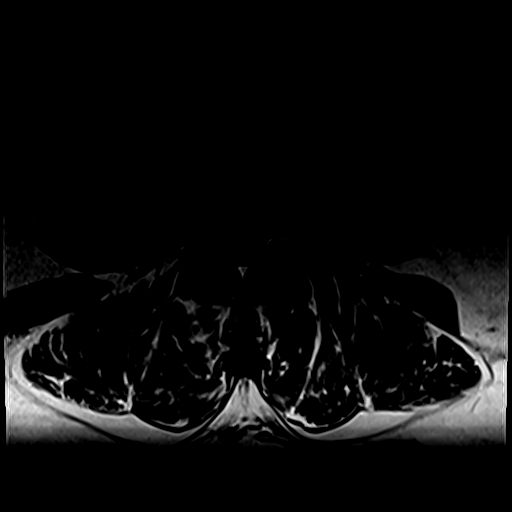
[im 23/42]
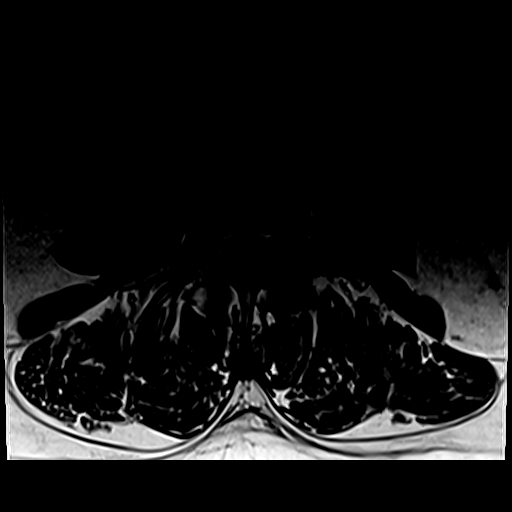
[im 28/42]
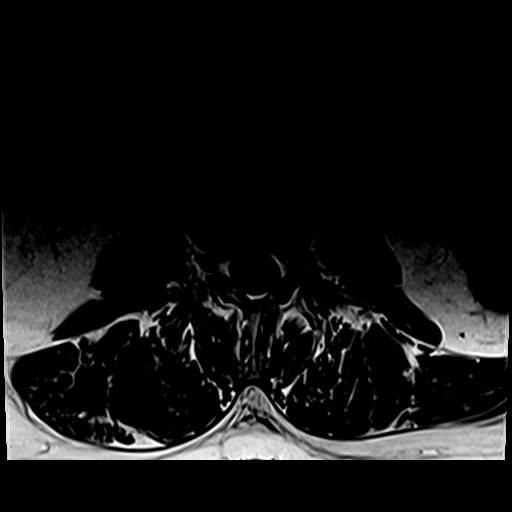
[im 37/42]
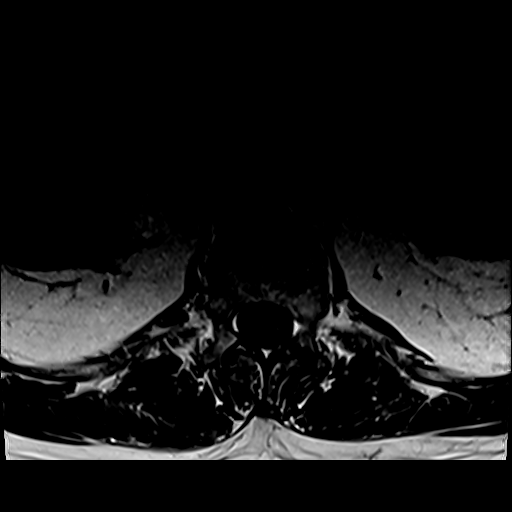
[im 42/42]
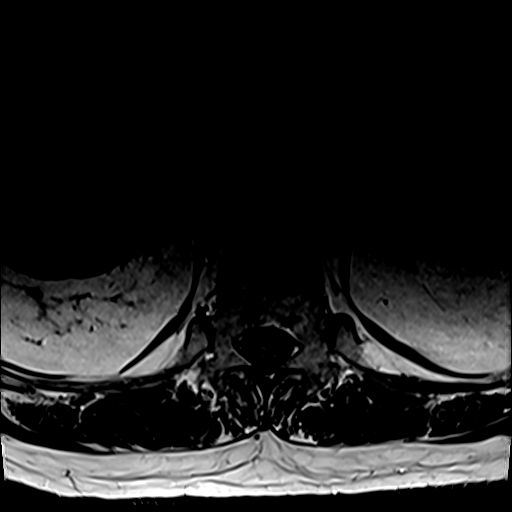

[Series 9: T2 post-contrast · sagittal · 4.0mm · 0.84mm/px · 4 of 17 slices shown]
[im 1/17]
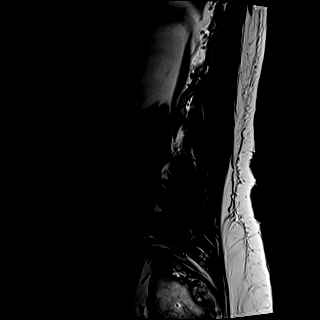
[im 6/17]
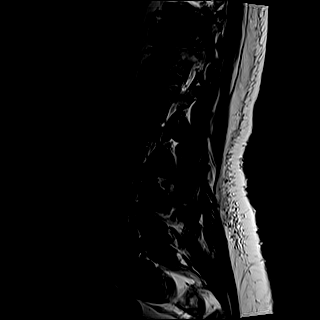
[im 11/17]
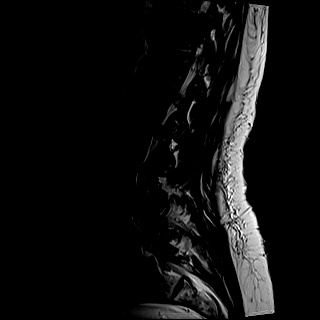
[im 17/17]
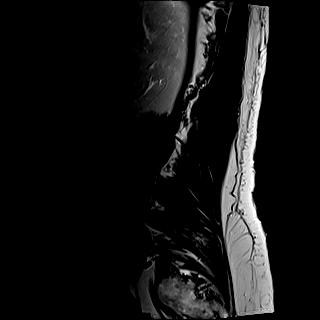

[Series 10: T1 fat-sat post-contrast · sagittal · 4.0mm · 0.84mm/px · 4 of 17 slices shown]
[im 1/17]
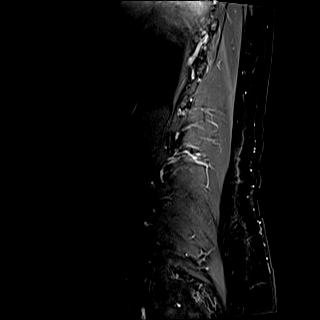
[im 6/17]
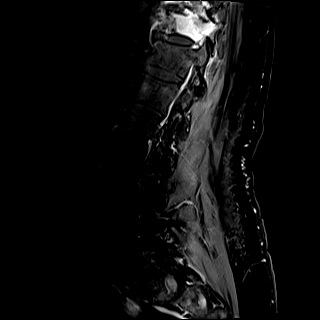
[im 11/17]
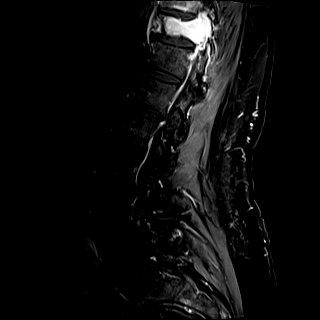
[im 17/17]
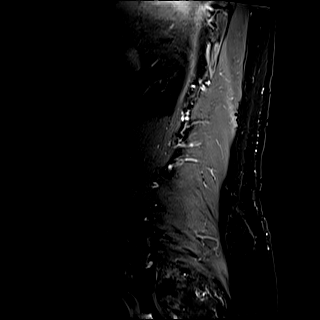

[29 of 48 positions shown; findings below may reference images not displayed]

FINDINGS: MRI CERVICAL SPINE FINDINGS

Alignment: Straightening of the cervical curvature with mild
reversal at the C5-6 level. Small anterolisthesis of C7 over T1.

Vertebrae: No fracture, evidence of discitis, or bone lesion. No
focus of abnormal contrast enhancement.

Cord: Flattening of the anterior cord contour from C3-4 through
C6-7.

Posterior Fossa, vertebral arteries, paraspinal tissues: T2
hyperintensity within the pons, most likely related to chronic
microangiopathy.

Disc levels:

C2-3: Posterior disc protrusion resulting in mild to moderate spinal
canal stenosis. No significant neural foraminal stenosis.

C3-4: Posterior disc protrusion resulting in moderate spinal canal
stenosis. Uncovertebral and facet degenerative changes resulting
severe bilateral neural foraminal narrowing.

C4-5: Posterior disc protrusion resulting in mild to moderate spinal
canal stenosis. Uncovertebral and facet degenerative changes
resulting in mild-to-moderate bilateral neural foraminal narrowing.

C5-6: Posterior disc protrusion resulting in mild spinal canal
stenosis. Uncovertebral and facet degenerative changes resulting in
moderate right and severe left neural foraminal narrowing.

C6-7: Posterior disc protrusion resulting in mild spinal canal
stenosis. Uncovertebral and facet degenerative changes resulting in
moderate bilateral neural foraminal.

C7-T1: Small posterior disc protrusion without significant spinal
canal stenosis. Facet degenerative changes resulting in mild
bilateral neural foraminal narrowing.

MRI LUMBAR SPINE FINDINGS

Segmentation:  Standard.

Alignment:  Physiologic.

Vertebrae: Again seen is large destructive lesion of the T11
vertebral body and extending to the posterior elements with
retropulsion resulting in spinal canal stenosis and compression of
the cord at this level. This is only evaluated on sagittal images
and appear stable when compared to recent dedicated MRI of the
thoracic spine. Small enhancing lesions are seen at T12 and L1, also
stable compared to prior MRI. No other suspicious enhancing lesion
identified.

Conus medullaris and cauda equina: Conus extends to the L2 level.
Conus and cauda equina appear normal. Lower thoracic cord
compression, as described above.

Paraspinal and other soft tissues: Left renal cyst.

Disc levels:

T12-L1: No spinal canal or neural foraminal stenosis.

L1-2: Mild facet degenerative changes. No spinal canal or neural
foraminal stenosis.

L2-3: Mild facet degenerative changes. No spinal canal or neural
foraminal stenosis.

L3-4: Shallow disc bulge and moderate facet degenerative changes. No
spinal canal or neural foraminal stenosis.

L4-5: Shallow disc bulge and advanced hypertrophic facet
degenerative changes, right greater than left without significant
spinal canal or neural foraminal stenosis.

L5-S1: Shallow disc bulge and moderate facet degenerative changes
without significant spinal canal or neural foraminal stenosis.
IMPRESSION: 1. No significant interval change of the destructive lesion
involving the T11 vertebra which is only partially assessed in the
current study. Small enhancing lesions at T12 and L1 also appear
unchanged. No additional enhancing lesion identified in the cervical
or lumbar spine.
2. Degenerative changes of the cervical spine with moderate spinal
canal stenosis at C3-4 with mild mass effect on the cord from C2-3
through C5-6 without cord signal abnormality.
3. Multilevel high-grade neural foraminal narrowing the cervical
spine, severe bilaterally at C3-4 and on the left at C5-6.
4. Degenerative changes of the lumbar spine, mostly at the facet
joints, more pronounced at L4-5. No significant spinal canal or
neural foraminal stenosis of the lumbar spine.

## 2020-09-19 MED ORDER — GADOBUTROL 1 MMOL/ML IV SOLN
10.0000 mL | Freq: Once | INTRAVENOUS | Status: AC | PRN
  Administered 2020-09-19: 10 mL via INTRAVENOUS

## 2020-09-23 NOTE — Pre-Procedure Instructions (Signed)
Andrew Espinoza  09/23/2020      [facility] [address], [place] [address] Phone: [phone] Fax: [place], [place] [address] [place] [address] [place] [address] Phone: [phone] Fax: [phone]    Your procedure is scheduled on May 11  Report to [facility] Entrance A at 5:30A.M.  Call this number if you have problems the morning of surgery:  [phone]   Remember:  Do not eat or drink after midnight.      Take these medicines the morning of surgery with A SIP OF WATER :              Albuterol if needed             breo - bring to hospital             Bupropion (wellbutrin xl)             Dicyclomine (bentyl) if needed             Hydrocodone if needed             Omeprazole (prilosec)              Pravastatin (pravachol)              tamsulosin (flomax)              Venlafaxine (effexor)              7 days prior to surgery STOP taking any Aspirin (unless otherwise instructed by your surgeon), Aleve, Naproxen, Ibuprofen, Motrin, Advil, Goody's, BC's, all herbal medications, fish oil, and all vitamins.                   Do not wear jewelry.  Do not wear lotions, powders, or perfumes, or deodorant.  Do not shave 48 hours prior to surgery.  Men may shave face and neck.  Do not bring valuables to the hospital.  [facility] is not responsible for any belongings or valuables.  Contacts, dentures or bridgework may not be worn into surgery.  Leave your suitcase in the car.  After surgery it may be brought to your room.  For patients admitted to the hospital, discharge time will be determined by your treatment team.  Patients discharged the day of surgery will not be allowed to drive home.    Special instructions:   [facility]- Preparing For Surgery  Before surgery, you can play an important role. Because skin is not sterile, your skin needs to  be as free of germs as possible. You can reduce the number of germs on your skin by washing with CHG (chlorahexidine gluconate) Soap before surgery.  CHG is an antiseptic cleaner which kills germs and bonds with the skin to continue killing germs even after washing.    Oral Hygiene is also important to reduce your risk of infection.  Remember - BRUSH YOUR TEETH THE MORNING OF SURGERY WITH YOUR REGULAR TOOTHPASTE  Please do not use if you have an allergy to CHG or antibacterial soaps. If your skin becomes reddened/irritated stop using the CHG.  Do not shave (including legs and underarms) for at least 48 hours prior to first CHG shower. It is OK to shave your face.  Please follow these instructions carefully.   1. Shower the NIGHT BEFORE SURGERY and the MORNING OF SURGERY  with CHG.   2. If you chose to wash your hair, wash your hair first as usual with your normal shampoo.  3. After you shampoo, rinse your hair and body thoroughly to remove the shampoo.  4. Use CHG as you would any other liquid soap. You can apply CHG directly to the skin and wash gently with a scrungie or a clean washcloth.   5. Apply the CHG Soap to your body ONLY FROM THE NECK DOWN.  Do not use on open wounds or open sores. Avoid contact with your eyes, ears, mouth and genitals (private parts). Wash Face and genitals (private parts)  with your normal soap.  6. Wash thoroughly, paying special attention to the area where your surgery will be performed.  7. Thoroughly rinse your body with warm water from the neck down.  8. DO NOT shower/wash with your normal soap after using and rinsing off the CHG Soap.  9. Pat yourself dry with a CLEAN TOWEL.  10. Wear CLEAN PAJAMAS to bed the night before surgery, wear comfortable clothes the morning of surgery  11. Place CLEAN SHEETS on your bed the night of your first shower and DO NOT SLEEP WITH PETS.    Day of Surgery:  Do not apply any deodorants/lotions.  Please wear clean  clothes to the hospital/surgery center.   Remember to brush your teeth WITH YOUR REGULAR TOOTHPASTE.    Please read over the following fact sheets that you were given.

## 2020-09-24 ENCOUNTER — Encounter (HOSPITAL_COMMUNITY): Payer: Self-pay

## 2020-09-24 ENCOUNTER — Other Ambulatory Visit: Payer: Self-pay | Admitting: Neurosurgery

## 2020-09-24 ENCOUNTER — Other Ambulatory Visit: Payer: Self-pay

## 2020-09-24 ENCOUNTER — Encounter (HOSPITAL_COMMUNITY)
Admission: RE | Admit: 2020-09-24 | Discharge: 2020-09-24 | Disposition: A | Discharge status: Home / Self Care | Payer: BC Managed Care – PPO | Source: Ambulatory Visit | Attending: Neurosurgery | Admitting: Neurosurgery

## 2020-09-24 DIAGNOSIS — Z20822 Contact with and (suspected) exposure to covid-19: Secondary | ICD-10-CM | POA: Diagnosis not present

## 2020-09-24 DIAGNOSIS — Z79899 Other long term (current) drug therapy: Secondary | ICD-10-CM | POA: Diagnosis not present

## 2020-09-24 DIAGNOSIS — Z01812 Encounter for preprocedural laboratory examination: Secondary | ICD-10-CM | POA: Insufficient documentation

## 2020-09-24 DIAGNOSIS — Z882 Allergy status to sulfonamides status: Secondary | ICD-10-CM | POA: Diagnosis not present

## 2020-09-24 DIAGNOSIS — F419 Anxiety disorder, unspecified: Secondary | ICD-10-CM | POA: Diagnosis not present

## 2020-09-24 DIAGNOSIS — J45909 Unspecified asthma, uncomplicated: Secondary | ICD-10-CM | POA: Diagnosis not present

## 2020-09-24 DIAGNOSIS — F32A Depression, unspecified: Secondary | ICD-10-CM | POA: Diagnosis not present

## 2020-09-24 DIAGNOSIS — Z981 Arthrodesis status: Secondary | ICD-10-CM | POA: Diagnosis not present

## 2020-09-24 DIAGNOSIS — E7800 Pure hypercholesterolemia, unspecified: Secondary | ICD-10-CM | POA: Diagnosis not present

## 2020-09-24 DIAGNOSIS — K219 Gastro-esophageal reflux disease without esophagitis: Secondary | ICD-10-CM | POA: Diagnosis not present

## 2020-09-24 DIAGNOSIS — F17210 Nicotine dependence, cigarettes, uncomplicated: Secondary | ICD-10-CM | POA: Diagnosis not present

## 2020-09-24 DIAGNOSIS — G4730 Sleep apnea, unspecified: Secondary | ICD-10-CM | POA: Diagnosis not present

## 2020-09-24 DIAGNOSIS — C9030 Solitary plasmacytoma not having achieved remission: Secondary | ICD-10-CM | POA: Diagnosis not present

## 2020-09-24 DIAGNOSIS — F418 Other specified anxiety disorders: Secondary | ICD-10-CM | POA: Diagnosis not present

## 2020-09-24 DIAGNOSIS — Z7982 Long term (current) use of aspirin: Secondary | ICD-10-CM | POA: Diagnosis not present

## 2020-09-24 DIAGNOSIS — D492 Neoplasm of unspecified behavior of bone, soft tissue, and skin: Secondary | ICD-10-CM | POA: Diagnosis not present

## 2020-09-24 DIAGNOSIS — M4854XA Collapsed vertebra, not elsewhere classified, thoracic region, initial encounter for fracture: Secondary | ICD-10-CM | POA: Diagnosis not present

## 2020-09-24 DIAGNOSIS — Z880 Allergy status to penicillin: Secondary | ICD-10-CM | POA: Diagnosis not present

## 2020-09-24 DIAGNOSIS — Z87442 Personal history of urinary calculi: Secondary | ICD-10-CM | POA: Diagnosis not present

## 2020-09-24 HISTORY — DX: Personal history of other diseases of the digestive system: Z87.19

## 2020-09-24 HISTORY — DX: Unspecified asthma, uncomplicated: J45.909

## 2020-09-24 HISTORY — DX: Personal history of urinary calculi: Z87.442

## 2020-09-24 HISTORY — DX: Unspecified osteoarthritis, unspecified site: M19.90

## 2020-09-24 HISTORY — DX: Depression, unspecified: F32.A

## 2020-09-24 LAB — BASIC METABOLIC PANEL
Anion gap: 10 (ref 5–15)
BUN: 7 mg/dL — ABNORMAL LOW (ref 8–23)
CO2: 22 mmol/L (ref 22–32)
Calcium: 9.5 mg/dL (ref 8.9–10.3)
Chloride: 104 mmol/L (ref 98–111)
Creatinine, Ser: 1.17 mg/dL (ref 0.61–1.24)
GFR, Estimated: >60 mL/min (ref >60)
Glucose, Bld: 139 mg/dL — ABNORMAL HIGH (ref 70–99)
Potassium: 3.1 mmol/L — ABNORMAL LOW (ref 3.5–5.1)
Sodium: 136 mmol/L (ref 135–145)

## 2020-09-24 LAB — SARS CORONAVIRUS 2 (TAT 6-24 HRS): SARS Coronavirus 2: NEGATIVE

## 2020-09-24 LAB — CBC
HCT: 49.6 % (ref 39.0–52.0)
Hemoglobin: 16.9 g/dL (ref 13.0–17.0)
MCH: 30.2 pg (ref 26.0–34.0)
MCHC: 34.1 g/dL (ref 30.0–36.0)
MCV: 88.6 fL (ref 80.0–100.0)
Platelets: 223 10*3/uL (ref 150–400)
RBC: 5.6 MIL/uL (ref 4.22–5.81)
RDW: 14.6 % (ref 11.5–15.5)
WBC: 12.1 10*3/uL — ABNORMAL HIGH (ref 4.0–10.5)
nRBC: 0 % (ref 0.0–0.2)

## 2020-09-24 LAB — SURGICAL PCR SCREEN
MRSA, PCR: NEGATIVE
Staphylococcus aureus: NEGATIVE

## 2020-09-24 NOTE — Progress Notes (Signed)
Pt arrived to his PAT today, no surgical orders or consent in chart.  Dr Christella Noa inboxed and left a message with Bonnita Nasuti at Dr Lacy Duverney office.

## 2020-09-24 NOTE — Progress Notes (Signed)
PCP - Lovette Cliche II Me Cardiologist - Denies Pulmonologist: Unknown.  Pt stated sees pulmonologist for asthma and OSA.  Chest x-ray - N/A EKG - Denies Stress Test - Denies ECHO - Denies Cardiac Cath - Denies  Sleep Study - Yes CPAP - Yes, pt does not know setting.  No hx of Diabetes  Blood Thinner Instructions:N/A Aspirin Instructions:N/A ERAS: Yes, No drink ordered/given   COVID TEST- 09/24/2020   Anesthesia review: No  Patient denies shortness of breath, fever, cough and chest pain at PAT appointment   All instructions explained to the patient, with a verbal understanding of the material. Patient agrees to go over the instructions while at home for a better understanding. Patient also instructed to self quarantine after being tested for COVID-19. The opportunity to ask questions was provided.

## 2020-09-26 ENCOUNTER — Other Ambulatory Visit: Payer: Self-pay

## 2020-09-26 ENCOUNTER — Encounter (HOSPITAL_COMMUNITY)
Admission: RE | Disposition: A | Discharge status: Home / Self Care | Payer: Self-pay | Source: Home / Self Care | Attending: Neurosurgery

## 2020-09-26 ENCOUNTER — Inpatient Hospital Stay (HOSPITAL_COMMUNITY): Payer: BC Managed Care – PPO | Admitting: Certified Registered Nurse Anesthetist

## 2020-09-26 ENCOUNTER — Encounter (HOSPITAL_COMMUNITY): Payer: Self-pay | Admitting: Neurosurgery

## 2020-09-26 ENCOUNTER — Inpatient Hospital Stay (HOSPITAL_COMMUNITY)
Admission: RE | Admit: 2020-09-26 | Discharge: 2020-09-28 | DRG: 824 | Disposition: A | Discharge status: Home Health | Payer: BC Managed Care – PPO | Attending: Neurosurgery | Admitting: Neurosurgery

## 2020-09-26 ENCOUNTER — Inpatient Hospital Stay (HOSPITAL_COMMUNITY): Payer: BC Managed Care – PPO

## 2020-09-26 DIAGNOSIS — C9030 Solitary plasmacytoma not having achieved remission: Secondary | ICD-10-CM | POA: Diagnosis present

## 2020-09-26 DIAGNOSIS — F419 Anxiety disorder, unspecified: Secondary | ICD-10-CM | POA: Diagnosis present

## 2020-09-26 DIAGNOSIS — F17210 Nicotine dependence, cigarettes, uncomplicated: Secondary | ICD-10-CM | POA: Diagnosis present

## 2020-09-26 DIAGNOSIS — J45909 Unspecified asthma, uncomplicated: Secondary | ICD-10-CM | POA: Diagnosis present

## 2020-09-26 DIAGNOSIS — F32A Depression, unspecified: Secondary | ICD-10-CM | POA: Diagnosis present

## 2020-09-26 DIAGNOSIS — K219 Gastro-esophageal reflux disease without esophagitis: Secondary | ICD-10-CM | POA: Diagnosis present

## 2020-09-26 DIAGNOSIS — Z882 Allergy status to sulfonamides status: Secondary | ICD-10-CM

## 2020-09-26 DIAGNOSIS — Z880 Allergy status to penicillin: Secondary | ICD-10-CM

## 2020-09-26 DIAGNOSIS — Z7982 Long term (current) use of aspirin: Secondary | ICD-10-CM | POA: Diagnosis not present

## 2020-09-26 DIAGNOSIS — Z79899 Other long term (current) drug therapy: Secondary | ICD-10-CM

## 2020-09-26 DIAGNOSIS — G4730 Sleep apnea, unspecified: Secondary | ICD-10-CM | POA: Diagnosis present

## 2020-09-26 DIAGNOSIS — Z20822 Contact with and (suspected) exposure to covid-19: Secondary | ICD-10-CM | POA: Diagnosis present

## 2020-09-26 DIAGNOSIS — Z981 Arthrodesis status: Secondary | ICD-10-CM | POA: Diagnosis not present

## 2020-09-26 DIAGNOSIS — Z87442 Personal history of urinary calculi: Secondary | ICD-10-CM

## 2020-09-26 DIAGNOSIS — M4854XA Collapsed vertebra, not elsewhere classified, thoracic region, initial encounter for fracture: Secondary | ICD-10-CM | POA: Diagnosis present

## 2020-09-26 DIAGNOSIS — Z419 Encounter for procedure for purposes other than remedying health state, unspecified: Secondary | ICD-10-CM

## 2020-09-26 HISTORY — PX: POSTERIOR LUMBAR FUSION 4 LEVEL: SHX6037

## 2020-09-26 LAB — CBC
HCT: 33.6 % — ABNORMAL LOW (ref 39.0–52.0)
Hemoglobin: 10.8 g/dL — ABNORMAL LOW (ref 13.0–17.0)
MCH: 29.7 pg (ref 26.0–34.0)
MCHC: 32.1 g/dL (ref 30.0–36.0)
MCV: 92.3 fL (ref 80.0–100.0)
Platelets: 186 10*3/uL (ref 150–400)
RBC: 3.64 MIL/uL — ABNORMAL LOW (ref 4.22–5.81)
RDW: 14.8 % (ref 11.5–15.5)
WBC: 15.9 10*3/uL — ABNORMAL HIGH (ref 4.0–10.5)
nRBC: 0 % (ref 0.0–0.2)

## 2020-09-26 LAB — POCT I-STAT 7, (LYTES, BLD GAS, ICA,H+H)
Acid-Base Excess: 3 mmol/L — ABNORMAL HIGH (ref 0.0–2.0)
Acid-Base Excess: 3 mmol/L — ABNORMAL HIGH (ref 0.0–2.0)
Acid-Base Excess: 1 mmol/L (ref 0.0–2.0)
Acid-Base Excess: 2 mmol/L (ref 0.0–2.0)
Bicarbonate: 28.5 mmol/L — ABNORMAL HIGH (ref 20.0–28.0)
Bicarbonate: 28.7 mmol/L — ABNORMAL HIGH (ref 20.0–28.0)
Bicarbonate: 26.7 mmol/L (ref 20.0–28.0)
Bicarbonate: 27.1 mmol/L (ref 20.0–28.0)
Calcium, Ion: 1.15 mmol/L (ref 1.15–1.40)
Calcium, Ion: 1.15 mmol/L (ref 1.15–1.40)
Calcium, Ion: 1.16 mmol/L (ref 1.15–1.40)
Calcium, Ion: 1.24 mmol/L (ref 1.15–1.40)
HCT: 37 % — ABNORMAL LOW (ref 39.0–52.0)
HCT: 38 % — ABNORMAL LOW (ref 39.0–52.0)
HCT: 36 % — ABNORMAL LOW (ref 39.0–52.0)
HCT: 31 % — ABNORMAL LOW (ref 39.0–52.0)
Hemoglobin: 12.6 g/dL — ABNORMAL LOW (ref 13.0–17.0)
Hemoglobin: 12.9 g/dL — ABNORMAL LOW (ref 13.0–17.0)
Hemoglobin: 12.2 g/dL — ABNORMAL LOW (ref 13.0–17.0)
Hemoglobin: 10.5 g/dL — ABNORMAL LOW (ref 13.0–17.0)
O2 Saturation: 100 %
O2 Saturation: 100 %
O2 Saturation: 100 %
O2 Saturation: 100 %
Patient temperature: 35.9
Patient temperature: 36.3
Patient temperature: 36.7
Potassium: 3.1 mmol/L — ABNORMAL LOW (ref 3.5–5.1)
Potassium: 3.3 mmol/L — ABNORMAL LOW (ref 3.5–5.1)
Potassium: 3.4 mmol/L — ABNORMAL LOW (ref 3.5–5.1)
Potassium: 3.5 mmol/L (ref 3.5–5.1)
Sodium: 138 mmol/L (ref 135–145)
Sodium: 138 mmol/L (ref 135–145)
Sodium: 138 mmol/L (ref 135–145)
Sodium: 137 mmol/L (ref 135–145)
TCO2: 30 mmol/L (ref 22–32)
TCO2: 30 mmol/L (ref 22–32)
TCO2: 28 mmol/L (ref 22–32)
TCO2: 28 mmol/L (ref 22–32)
pCO2 arterial: 45 mmHg (ref 32.0–48.0)
pCO2 arterial: 45.3 mmHg (ref 32.0–48.0)
pCO2 arterial: 43.6 mmHg (ref 32.0–48.0)
pCO2 arterial: 45.1 mmHg (ref 32.0–48.0)
pH, Arterial: 7.406 (ref 7.350–7.450)
pH, Arterial: 7.409 (ref 7.350–7.450)
pH, Arterial: 7.393 (ref 7.350–7.450)
pH, Arterial: 7.385 (ref 7.350–7.450)
pO2, Arterial: 218 mmHg — ABNORMAL HIGH (ref 83.0–108.0)
pO2, Arterial: 228 mmHg — ABNORMAL HIGH (ref 83.0–108.0)
pO2, Arterial: 221 mmHg — ABNORMAL HIGH (ref 83.0–108.0)
pO2, Arterial: 252 mmHg — ABNORMAL HIGH (ref 83.0–108.0)

## 2020-09-26 LAB — ABO/RH: ABO/RH(D): O POS

## 2020-09-26 LAB — PREPARE RBC (CROSSMATCH)

## 2020-09-26 LAB — CREATININE, SERUM
Creatinine, Ser: 1.38 mg/dL — ABNORMAL HIGH (ref 0.61–1.24)
GFR, Estimated: 57 mL/min — ABNORMAL LOW (ref >60)

## 2020-09-26 IMAGING — RF Imaging study
1 series · 8 of 8 positions shown · IV contrast (omnipaque)
Comparison: PET-CT, [DATE]

CLINICAL DATA: Fall, blood thinners, history of follicular lymphoma

EXAM:
CT CHEST, ABDOMEN, AND PELVIS WITH CONTRAST
TECHNIQUE: Multidetector CT imaging of the chest, abdomen and pelvis was
performed following the standard protocol during bolus
administration of intravenous contrast.
CONTRAST:  50mL OMNIPAQUE IOHEXOL 300 MG/ML  SOLN

[Series 1: run · 8 of 8 slices shown]
[im 1/8]
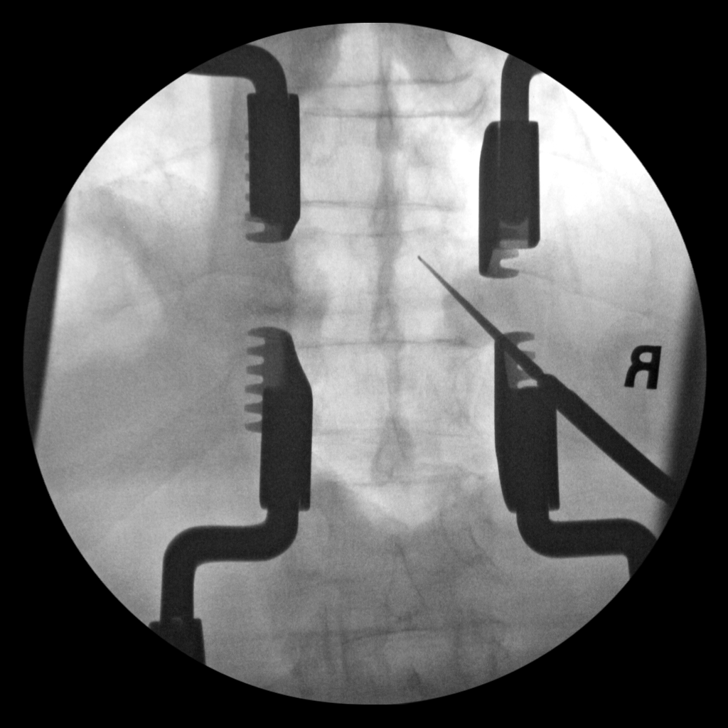
[im 2/8]
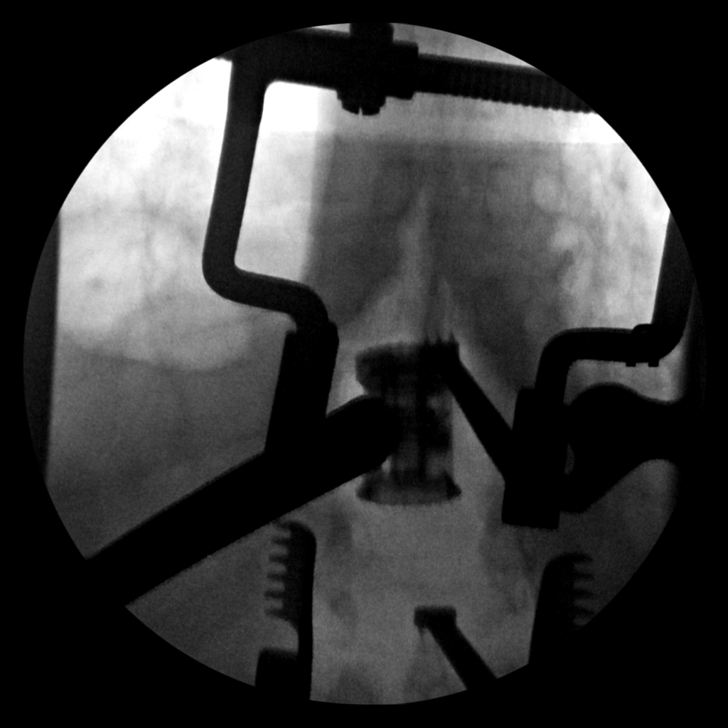
[im 3/8]
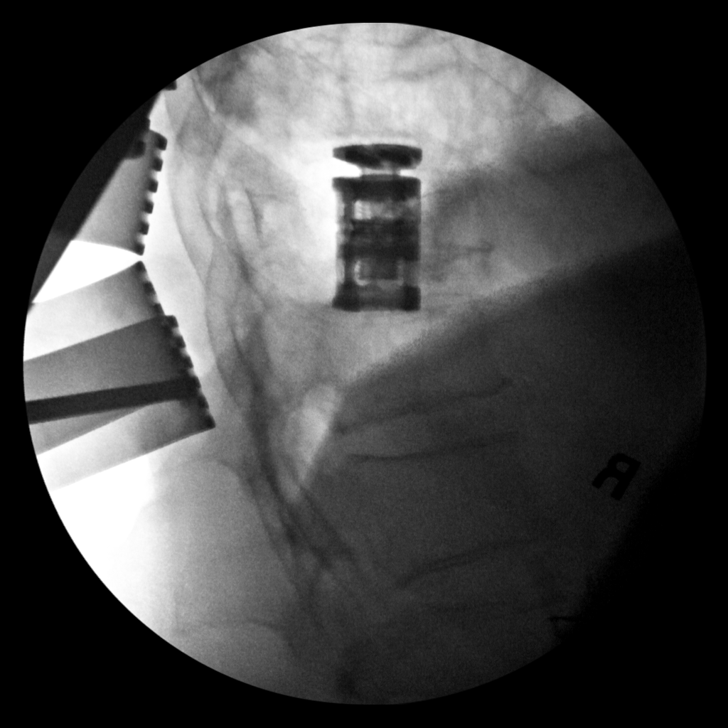
[im 4/8]
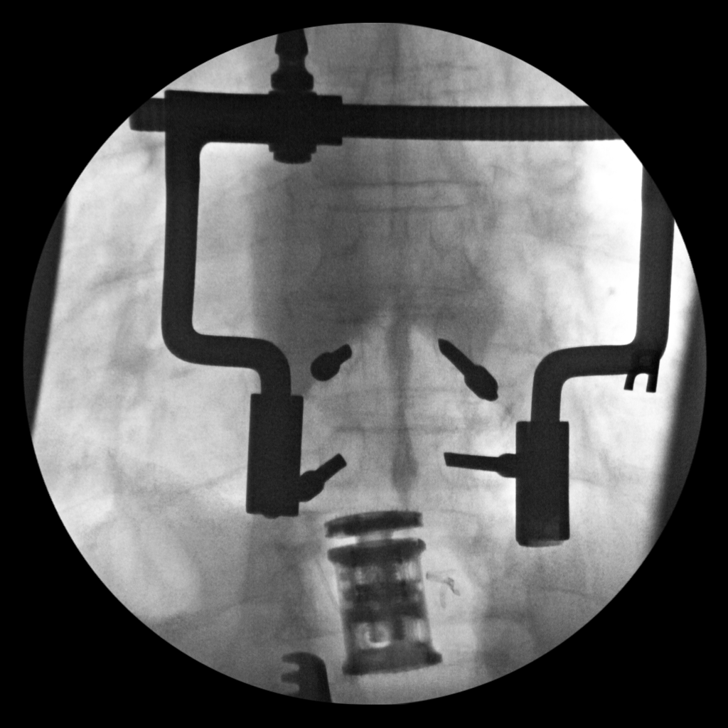
[im 5/8]
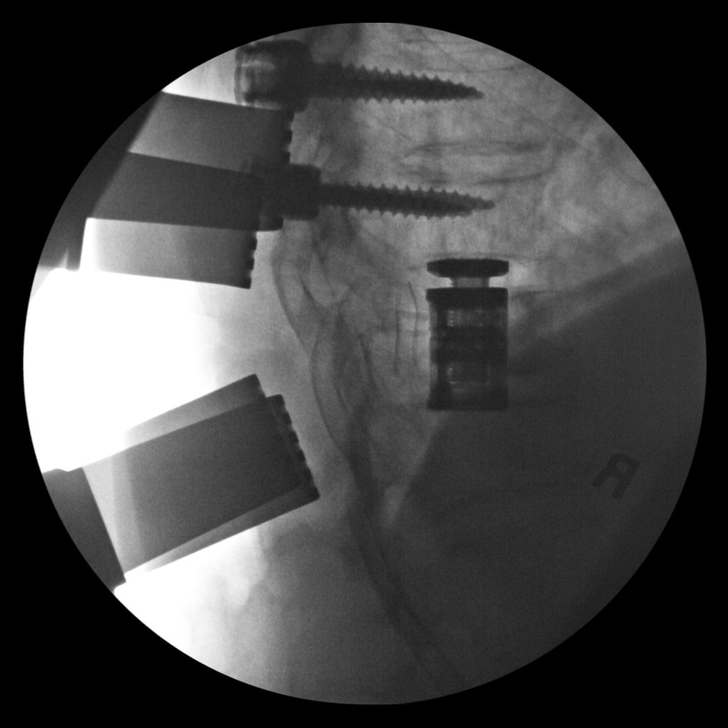
[im 6/8]
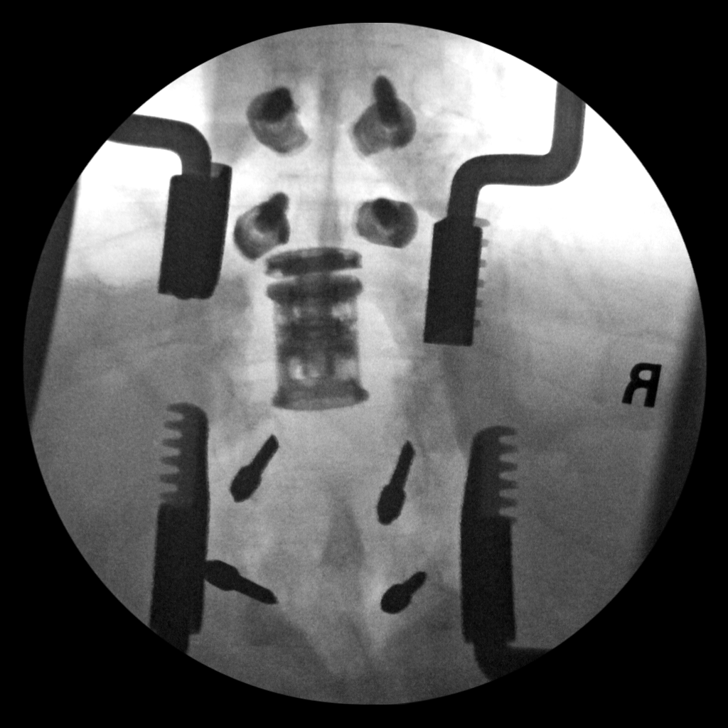
[im 7/8]
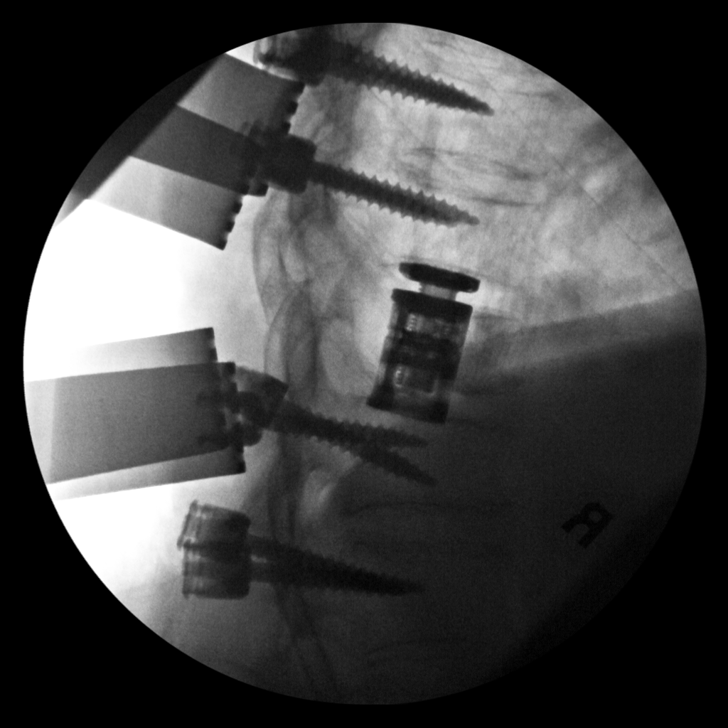
[im 8/8]
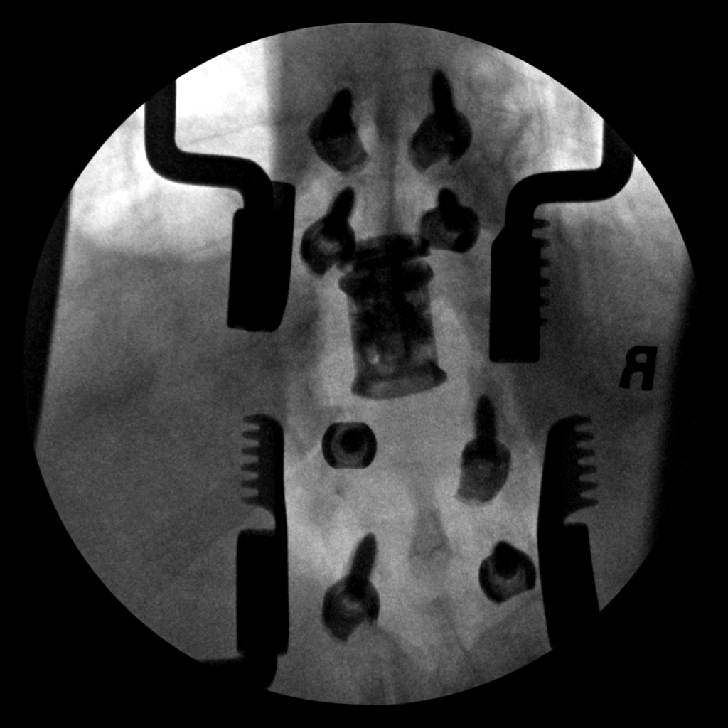

[8 of 8 positions shown; findings below may reference images not displayed]

FINDINGS: CT CHEST FINDINGS

Cardiovascular: Aortic atherosclerosis. Cardiomegaly. No pericardial
effusion.

Mediastinum/Nodes: Enlarged mediastinal lymph nodes, largest
pretracheal nodes measuring up to 2.5 x 2.3 cm, significantly
increased in size compared to most recent imaging of the chest dated
[DATE] (series 3, image 24). Thyroid gland, trachea, and
esophagus demonstrate no significant findings.

Lungs/Pleura: Small bilateral pleural effusions. Heterogeneous and
ground-glass airspace opacity of the right lower lobe (series 5,
image 78). Bandlike scarring and or atelectasis of the lung bases.

Musculoskeletal: No chest wall mass or suspicious bone lesions
identified. High-grade wedge deformities of the T8 and T9 vertebral
bodies, new compared to remote prior imaging dated [DATE] and
chronic appearing, although due age indeterminate.

CT ABDOMEN PELVIS FINDINGS

Hepatobiliary: No solid liver abnormality is seen. No gallstones,
gallbladder wall thickening, or biliary dilatation.

Pancreas: Unremarkable. No pancreatic ductal dilatation or
surrounding inflammatory changes.

Spleen: Normal in size without significant abnormality.

Adrenals/Urinary Tract: Adrenal glands are unremarkable. Kidneys are
normal, without renal calculi, solid lesion, or hydronephrosis.
Bladder is unremarkable.

Stomach/Bowel: Stomach is within normal limits. Appendix is not
clearly visualized. No evidence of bowel wall thickening,
distention, or inflammatory changes.

Vascular/Lymphatic: Aortic atherosclerosis. No enlarged abdominal or
pelvic lymph nodes.

Reproductive: No mass or other abnormality.

Other: No abdominal wall hernia or abnormality. No abdominopelvic
ascites.

Musculoskeletal: No acute or significant osseous findings.
IMPRESSION: 1. No CT evidence of acute traumatic injury to the organs of the
chest, abdomen, or pelvis.
2. High-grade wedge deformities of the T8 and T9 vertebral bodies,
new compared to remote prior imaging dated [DATE] and chronic
appearing, although strictly age indeterminate. Correlate for acute
point tenderness.
3. Small bilateral pleural effusions.
4. Heterogeneous and ground-glass airspace opacity of the right
lower lobe , nonspecific and infectious or inflammatory, favor
aspiration given distribution.
5. Enlarged mediastinal lymph nodes, largest pretracheal nodes
measuring up to 2.5 x 2.3 cm, significantly increased in size
compared to most recent prior imaging of the chest dated [DATE].
These are concerning for recurrence of lymphoma given patient
history and could be characterized metabolic activity on a
nonemergent basis by PET-CT clinically appropriate.
6. Cardiomegaly.

Aortic Atherosclerosis ([Z5]-[Z5]).

## 2020-09-26 IMAGING — RF DG THORACOLUMBAR SPINE 2V
1 series · 8 of 8 positions shown · non-contrast
Comparison: Prior MRIs.

CLINICAL DATA: Intraop. T11 corpectomy with T9-L1 arthrodesis and
pedicle screws.

EXAM:
THORACOLUMBAR SPINE 1V

[Series 1: run · 8 of 8 slices shown]
[im 1/8]
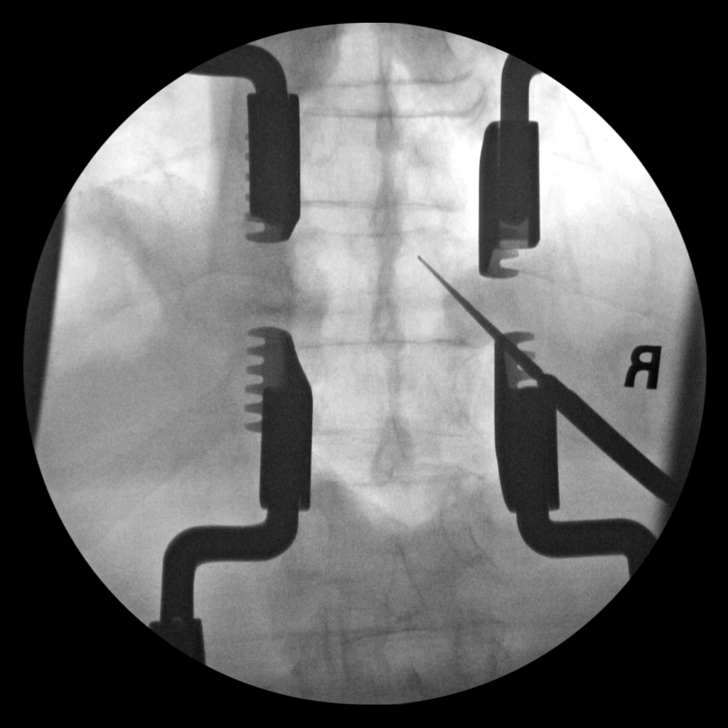
[im 2/8]
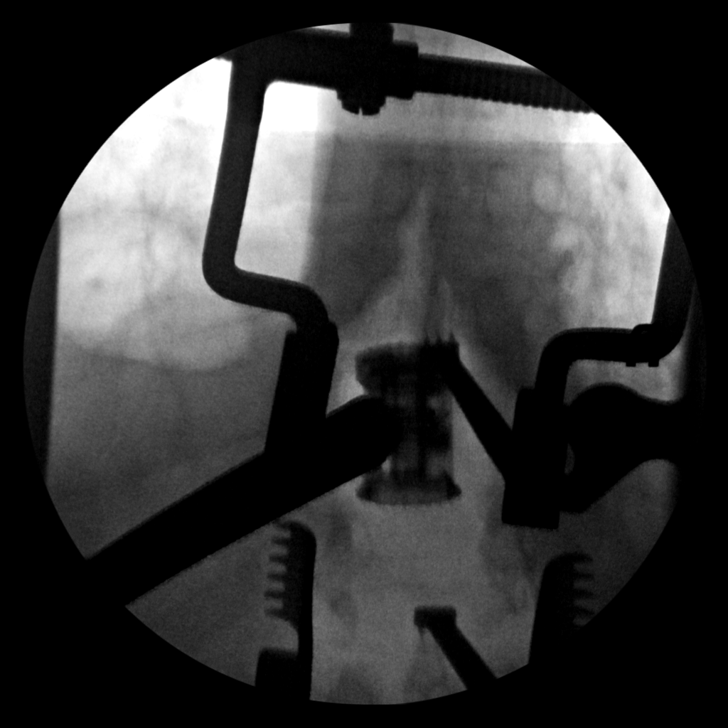
[im 3/8]
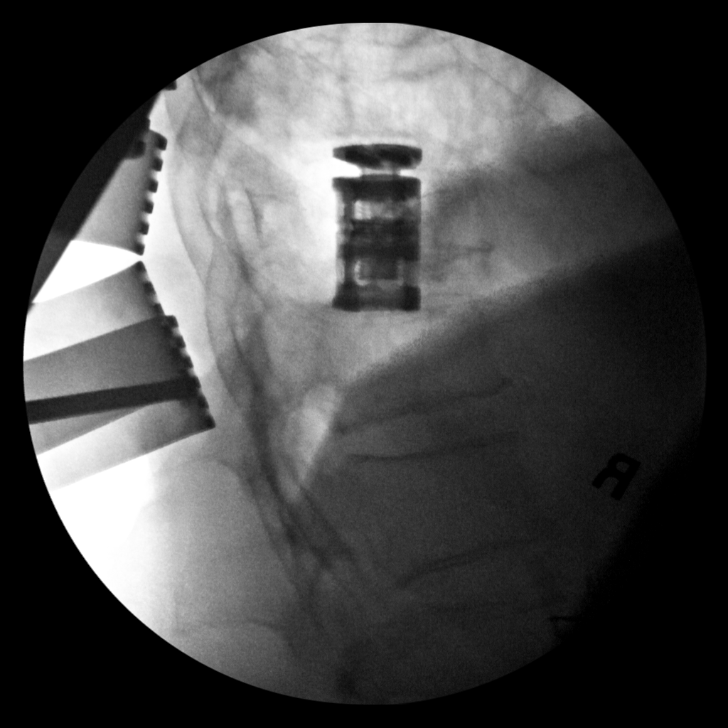
[im 4/8]
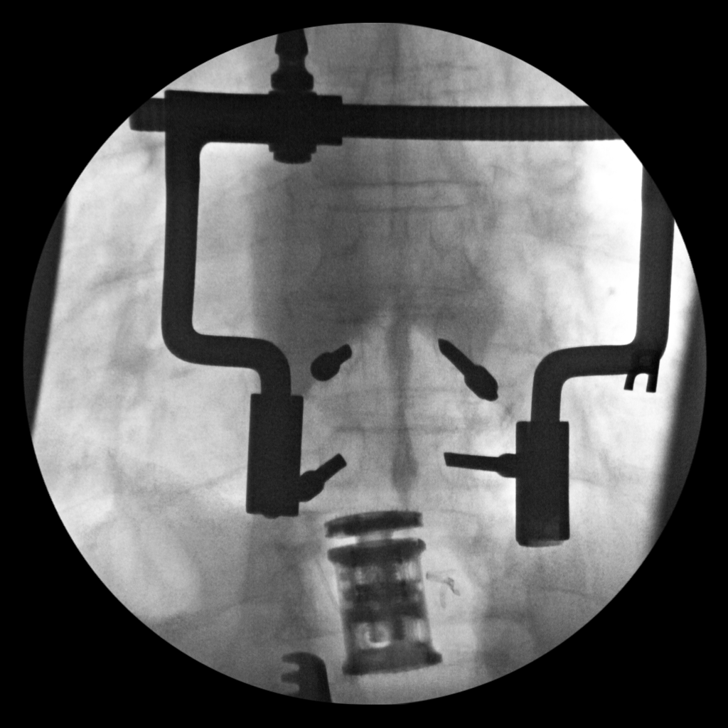
[im 5/8]
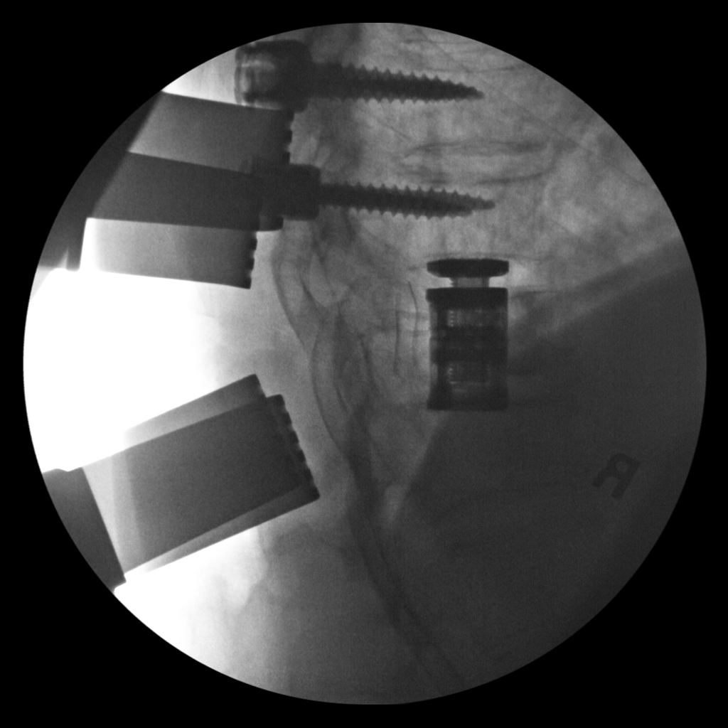
[im 6/8]
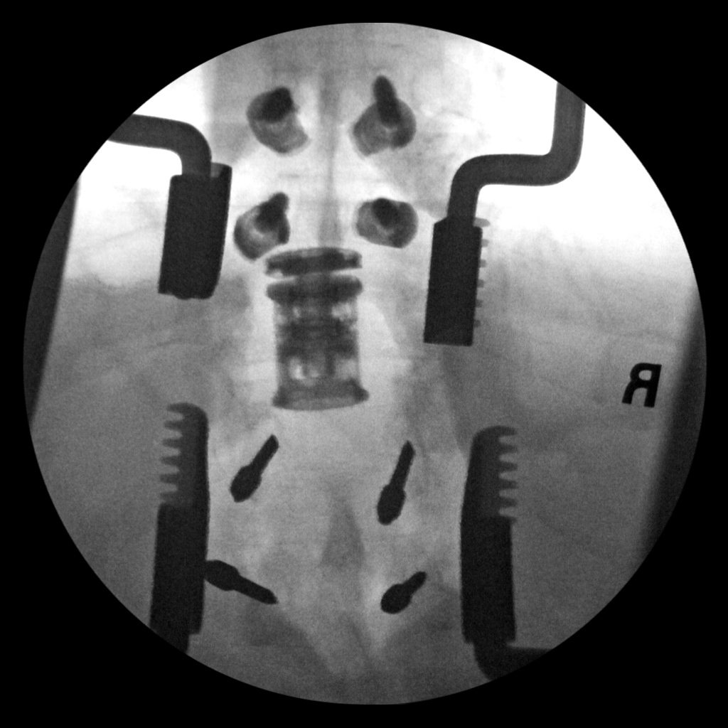
[im 7/8]
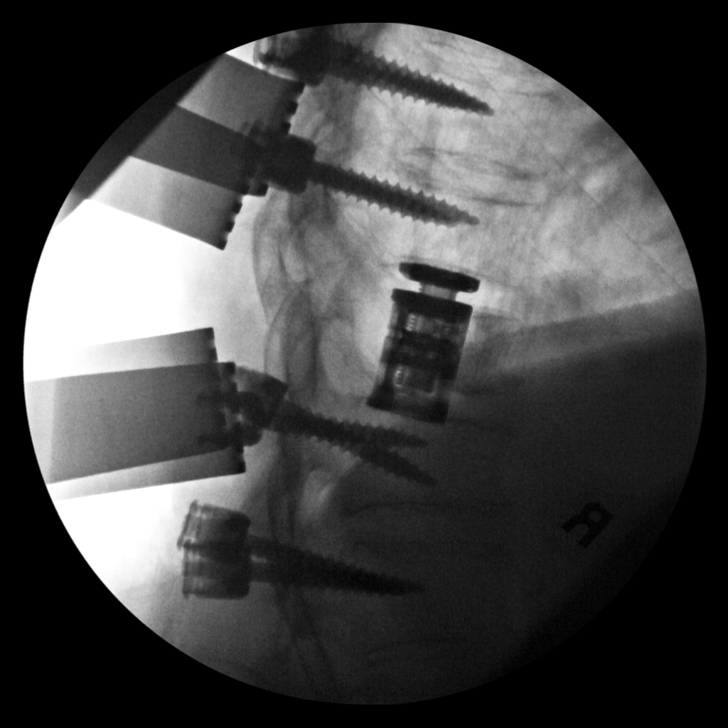
[im 8/8]
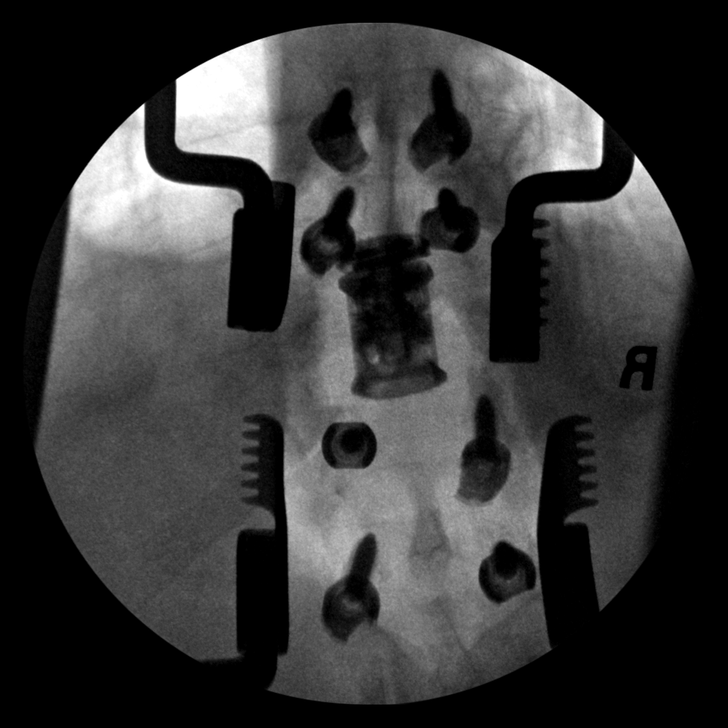

[8 of 8 positions shown; findings below may reference images not displayed]

FINDINGS: Eight fluoroscopic spot views of the thoracolumbar spine obtained in
the operating room. Corpectomy at T11 with pedicle screws T9 through
L1. Total fluoroscopy time 3 minutes. Total dose 111.99 mGy.
IMPRESSION: Intraoperative fluoroscopy during T11 corpectomy and T9 through L1
fusion.

## 2020-09-26 IMAGING — RF DG C-ARM 1-60 MIN
1 series · 8 of 8 positions shown · non-contrast
Comparison: Prior MRIs.

CLINICAL DATA: Intraop. T11 corpectomy with T9-L1 arthrodesis and
pedicle screws.

EXAM:
THORACOLUMBAR SPINE 1V

[Series 1: run · 8 of 8 slices shown]
[im 1/8]
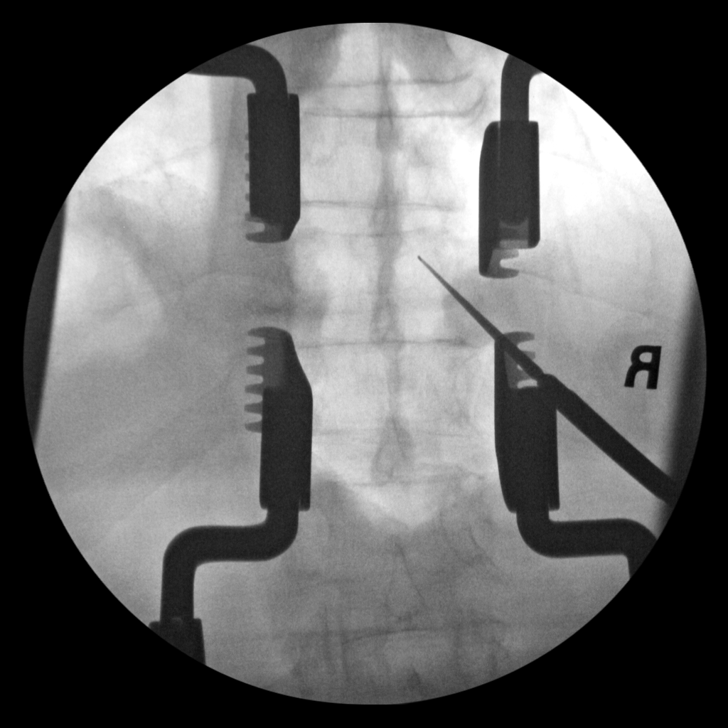
[im 2/8]
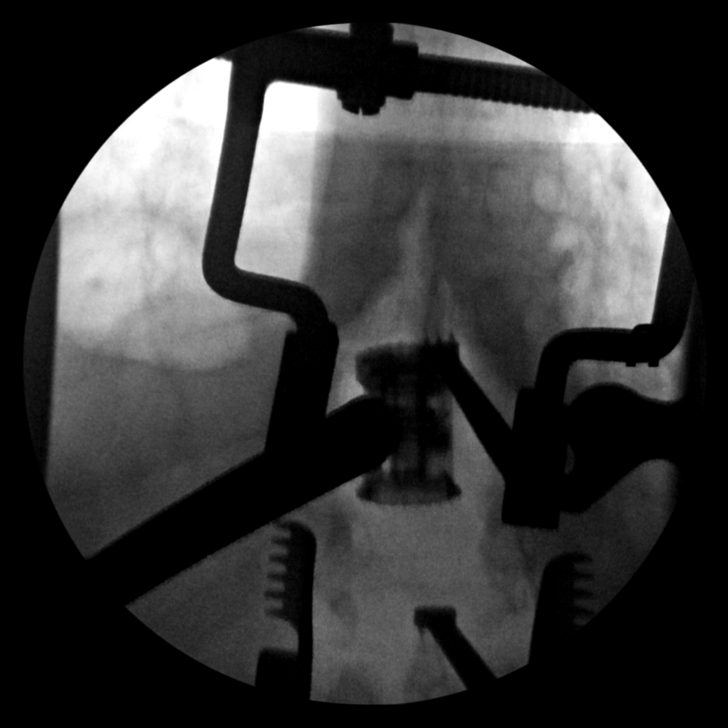
[im 3/8]
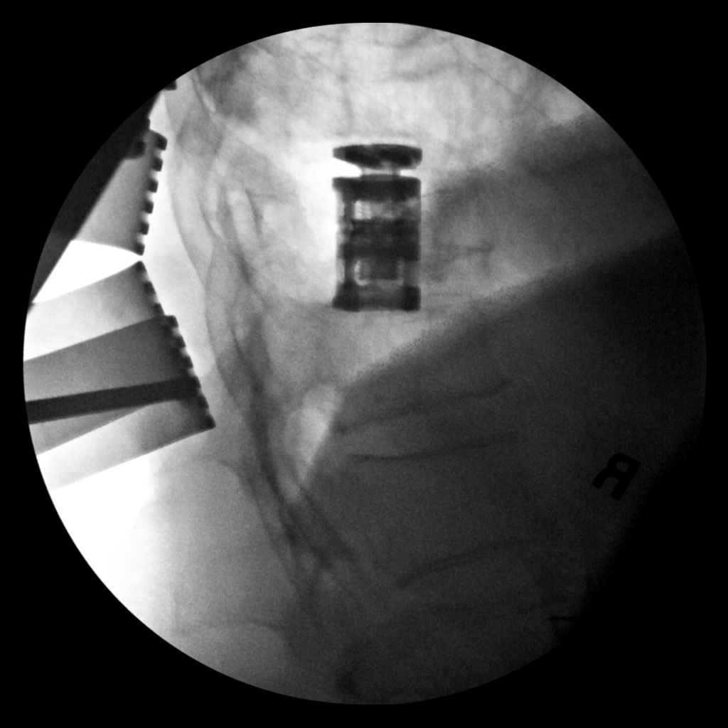
[im 4/8]
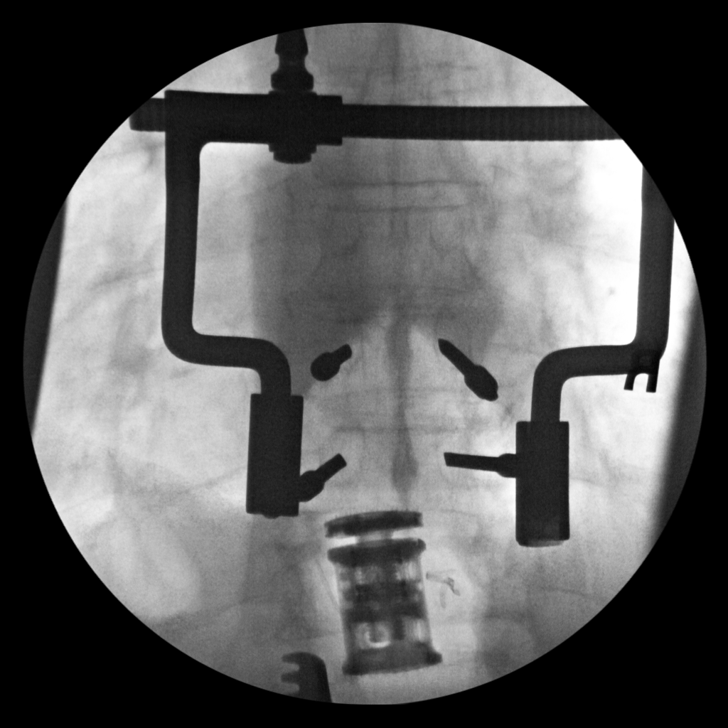
[im 5/8]
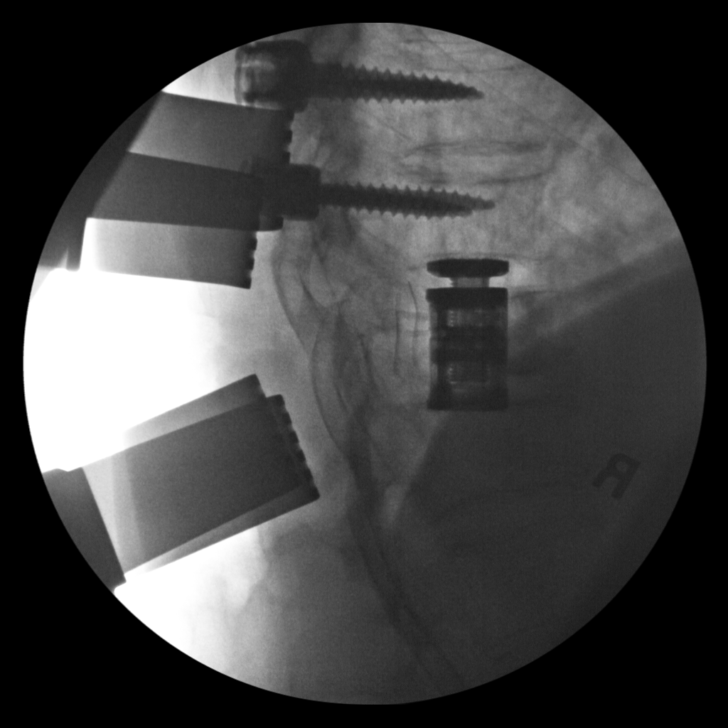
[im 6/8]
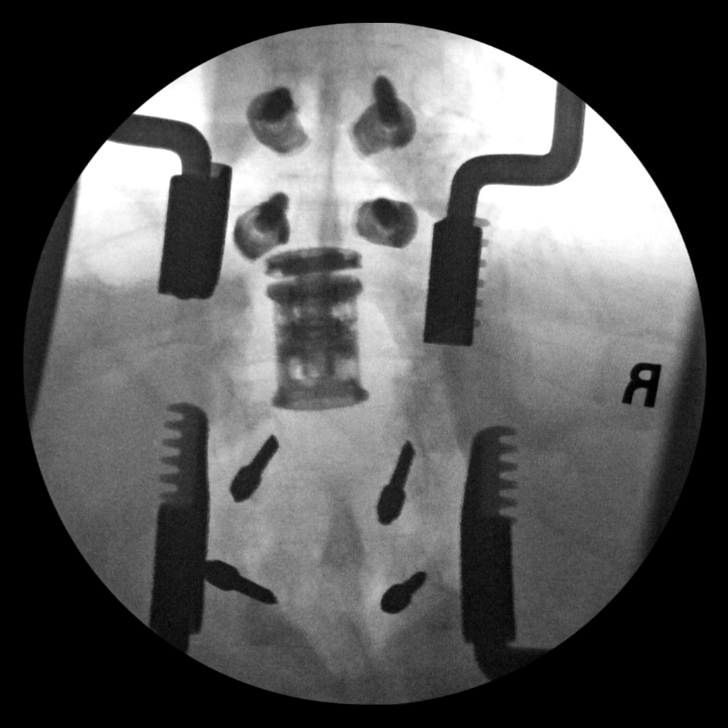
[im 7/8]
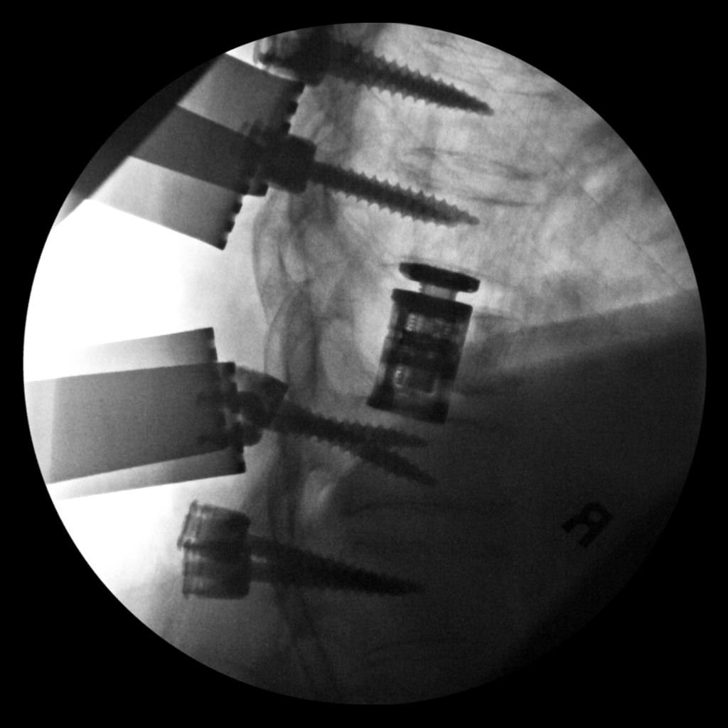
[im 8/8]
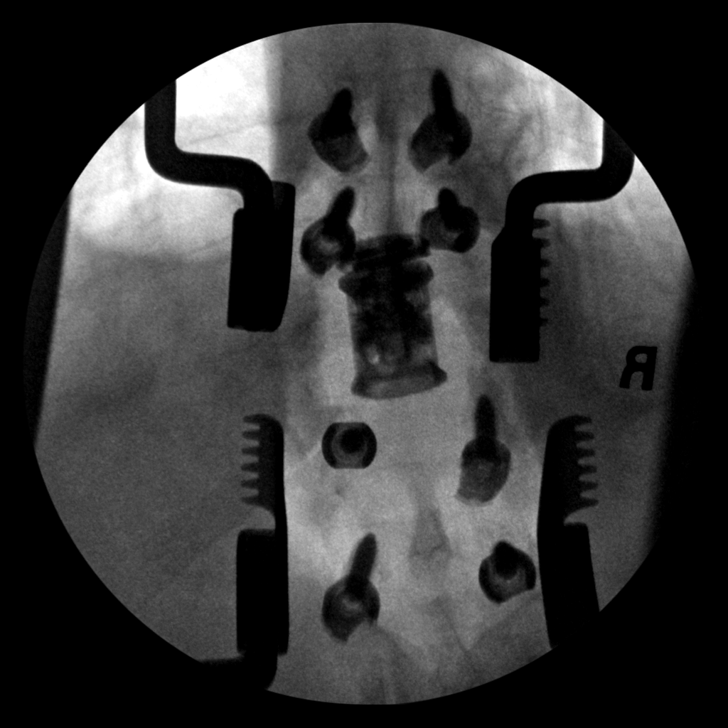

[8 of 8 positions shown; findings below may reference images not displayed]

FINDINGS: Eight fluoroscopic spot views of the thoracolumbar spine obtained in
the operating room. Corpectomy at T11 with pedicle screws T9 through
L1. Total fluoroscopy time 3 minutes. Total dose 111.99 mGy.
IMPRESSION: Intraoperative fluoroscopy during T11 corpectomy and T9 through L1
fusion.

## 2020-09-26 SURGERY — POSTERIOR LUMBAR FUSION 4 LEVEL
Anesthesia: General | Site: Spine Thoracic

## 2020-09-26 MED ORDER — OXYCODONE HCL ER 10 MG PO T12A
10.0000 mg | EXTENDED_RELEASE_TABLET | Freq: Two times a day (BID) | ORAL | Status: DC
  Administered 2020-09-26 – 2020-09-27 (×2): 10 mg via ORAL
  Filled 2020-09-26 (×2): qty 1

## 2020-09-26 MED ORDER — ROCURONIUM BROMIDE 10 MG/ML (PF) SYRINGE
PREFILLED_SYRINGE | INTRAVENOUS | Status: AC
  Filled 2020-09-26: qty 10

## 2020-09-26 MED ORDER — PHENYLEPHRINE 40 MCG/ML (10ML) SYRINGE FOR IV PUSH (FOR BLOOD PRESSURE SUPPORT)
PREFILLED_SYRINGE | INTRAVENOUS | Status: DC | PRN
  Administered 2020-09-26 (×4): 40 ug via INTRAVENOUS
  Administered 2020-09-26: 80 ug via INTRAVENOUS
  Administered 2020-09-26 (×4): 40 ug via INTRAVENOUS
  Administered 2020-09-26: 80 ug via INTRAVENOUS

## 2020-09-26 MED ORDER — OXYCODONE HCL 5 MG PO TABS: 5.0000 mg | ORAL_TABLET | ORAL | Status: DC | PRN

## 2020-09-26 MED ORDER — THROMBIN 5000 UNITS EX SOLR
CUTANEOUS | Status: AC
  Filled 2020-09-26: qty 10000

## 2020-09-26 MED ORDER — OXYCODONE HCL 5 MG/5ML PO SOLN
5.0000 mg | Freq: Once | ORAL | Status: DC | PRN
Start: 2020-09-26 — End: 2020-09-26

## 2020-09-26 MED ORDER — ONDANSETRON HCL 4 MG/2ML IJ SOLN
INTRAMUSCULAR | Status: DC | PRN
  Administered 2020-09-26: 4 mg via INTRAVENOUS

## 2020-09-26 MED ORDER — VANCOMYCIN HCL IN DEXTROSE 1-5 GM/200ML-% IV SOLN
1000.0000 mg | INTRAVENOUS | Status: AC
  Administered 2020-09-26: 1000 mg via INTRAVENOUS
  Filled 2020-09-26: qty 200

## 2020-09-26 MED ORDER — PANTOPRAZOLE SODIUM 40 MG PO TBEC
40.0000 mg | DELAYED_RELEASE_TABLET | Freq: Every day | ORAL | Status: DC
  Administered 2020-09-27 – 2020-09-28 (×2): 40 mg via ORAL
  Filled 2020-09-26 (×2): qty 1

## 2020-09-26 MED ORDER — SUGAMMADEX SODIUM 200 MG/2ML IV SOLN
INTRAVENOUS | Status: DC | PRN
  Administered 2020-09-26: 100 mg via INTRAVENOUS
  Administered 2020-09-26: 200 mg via INTRAVENOUS

## 2020-09-26 MED ORDER — ALBUTEROL SULFATE HFA 108 (90 BASE) MCG/ACT IN AERS
1.0000 | INHALATION_SPRAY | Freq: Four times a day (QID) | RESPIRATORY_TRACT | Status: DC | PRN
Start: 2020-09-26 — End: 2020-09-26

## 2020-09-26 MED ORDER — DIAZEPAM 5 MG PO TABS
5.0000 mg | ORAL_TABLET | Freq: Four times a day (QID) | ORAL | Status: DC | PRN
  Administered 2020-09-26 – 2020-09-28 (×3): 5 mg via ORAL
  Filled 2020-09-26 (×3): qty 1

## 2020-09-26 MED ORDER — MAGNESIUM CITRATE PO SOLN: 1.0000 | Freq: Once | ORAL | Status: DC | PRN

## 2020-09-26 MED ORDER — LIDOCAINE 2% (20 MG/ML) 5 ML SYRINGE
INTRAMUSCULAR | Status: AC
  Filled 2020-09-26: qty 5

## 2020-09-26 MED ORDER — BUPIVACAINE HCL (PF) 0.5 % IJ SOLN
INTRAMUSCULAR | Status: DC | PRN
  Administered 2020-09-26: 30 mL

## 2020-09-26 MED ORDER — BUPROPION HCL ER (XL) 150 MG PO TB24
150.0000 mg | ORAL_TABLET | Freq: Every day | ORAL | Status: DC
  Administered 2020-09-27 – 2020-09-28 (×2): 150 mg via ORAL
  Filled 2020-09-26 (×2): qty 1

## 2020-09-26 MED ORDER — TAMSULOSIN HCL 0.4 MG PO CAPS
0.8000 mg | ORAL_CAPSULE | Freq: Every day | ORAL | Status: DC
  Administered 2020-09-26 – 2020-09-27 (×2): 0.8 mg via ORAL
  Filled 2020-09-26 (×2): qty 2

## 2020-09-26 MED ORDER — ACETAMINOPHEN 10 MG/ML IV SOLN
INTRAVENOUS | Status: DC | PRN
  Administered 2020-09-26: 1000 mg via INTRAVENOUS

## 2020-09-26 MED ORDER — PHENYLEPHRINE 40 MCG/ML (10ML) SYRINGE FOR IV PUSH (FOR BLOOD PRESSURE SUPPORT)
PREFILLED_SYRINGE | INTRAVENOUS | Status: AC
  Filled 2020-09-26: qty 10

## 2020-09-26 MED ORDER — FLUTICASONE FUROATE-VILANTEROL 100-25 MCG/INH IN AEPB
1.0000 | INHALATION_SPRAY | Freq: Every day | RESPIRATORY_TRACT | Status: DC
  Filled 2020-09-26: qty 28

## 2020-09-26 MED ORDER — LIDOCAINE-EPINEPHRINE 0.5 %-1:200000 IJ SOLN
INTRAMUSCULAR | Status: AC
  Filled 2020-09-26: qty 1

## 2020-09-26 MED ORDER — MIDAZOLAM HCL 2 MG/2ML IJ SOLN
INTRAMUSCULAR | Status: AC
  Filled 2020-09-26: qty 2

## 2020-09-26 MED ORDER — MORPHINE SULFATE (PF) 2 MG/ML IV SOLN
2.0000 mg | INTRAVENOUS | Status: DC | PRN
  Administered 2020-09-26: 2 mg via INTRAVENOUS
  Filled 2020-09-26: qty 1

## 2020-09-26 MED ORDER — LACTATED RINGERS IV SOLN: INTRAVENOUS | Status: DC | PRN

## 2020-09-26 MED ORDER — ESMOLOL HCL 100 MG/10ML IV SOLN
INTRAVENOUS | Status: DC | PRN
  Administered 2020-09-26: 20 mg via INTRAVENOUS
  Administered 2020-09-26: 30 mg via INTRAVENOUS

## 2020-09-26 MED ORDER — MIDAZOLAM HCL 5 MG/5ML IJ SOLN
INTRAMUSCULAR | Status: DC | PRN
  Administered 2020-09-26 (×2): 1 mg via INTRAVENOUS

## 2020-09-26 MED ORDER — ONDANSETRON HCL 4 MG/2ML IJ SOLN: 4.0000 mg | Freq: Four times a day (QID) | INTRAMUSCULAR | Status: DC | PRN

## 2020-09-26 MED ORDER — SODIUM CHLORIDE 0.9% FLUSH: 3.0000 mL | INTRAVENOUS | Status: DC | PRN

## 2020-09-26 MED ORDER — MAGNESIUM 100 MG PO TABS: 100.0000 mg | ORAL_TABLET | Freq: Two times a day (BID) | ORAL | Status: DC

## 2020-09-26 MED ORDER — SODIUM CHLORIDE 0.9% IV SOLUTION: Freq: Once | INTRAVENOUS | Status: DC

## 2020-09-26 MED ORDER — LACTATED RINGERS IV SOLN: INTRAVENOUS | Status: DC

## 2020-09-26 MED ORDER — SODIUM CHLORIDE 0.9 % IV SOLN
250.0000 mL | INTRAVENOUS | Status: DC
  Administered 2020-09-26: 250 mL via INTRAVENOUS

## 2020-09-26 MED ORDER — ALBUTEROL SULFATE (2.5 MG/3ML) 0.083% IN NEBU: 2.5000 mg | INHALATION_SOLUTION | Freq: Four times a day (QID) | RESPIRATORY_TRACT | Status: DC | PRN

## 2020-09-26 MED ORDER — CALCIUM CHLORIDE 10 % IV SOLN
INTRAVENOUS | Status: DC | PRN
  Administered 2020-09-26 (×5): 100 mg via INTRAVENOUS

## 2020-09-26 MED ORDER — DICYCLOMINE HCL 10 MG PO CAPS: 10.0000 mg | ORAL_CAPSULE | Freq: Three times a day (TID) | ORAL | Status: DC | PRN

## 2020-09-26 MED ORDER — ALBUMIN HUMAN 5 % IV SOLN: INTRAVENOUS | Status: DC | PRN

## 2020-09-26 MED ORDER — LIDOCAINE 2% (20 MG/ML) 5 ML SYRINGE
INTRAMUSCULAR | Status: DC | PRN
  Administered 2020-09-26: 60 mg via INTRAVENOUS

## 2020-09-26 MED ORDER — ACETAMINOPHEN 650 MG RE SUPP: 650.0000 mg | RECTAL | Status: DC | PRN

## 2020-09-26 MED ORDER — SENNOSIDES-DOCUSATE SODIUM 8.6-50 MG PO TABS: 1.0000 | ORAL_TABLET | Freq: Every evening | ORAL | Status: DC | PRN

## 2020-09-26 MED ORDER — OMEPRAZOLE MAGNESIUM 20 MG PO TBEC: 20.0000 mg | DELAYED_RELEASE_TABLET | Freq: Every day | ORAL | Status: DC

## 2020-09-26 MED ORDER — CALCIUM CHLORIDE 10 % IV SOLN
INTRAVENOUS | Status: AC
  Filled 2020-09-26: qty 10

## 2020-09-26 MED ORDER — OXYCODONE HCL 5 MG PO TABS: 5.0000 mg | ORAL_TABLET | Freq: Once | ORAL | Status: DC | PRN

## 2020-09-26 MED ORDER — POTASSIUM CHLORIDE IN NACL 20-0.9 MEQ/L-% IV SOLN: INTRAVENOUS | Status: DC

## 2020-09-26 MED ORDER — THROMBIN 5000 UNITS EX SOLR: OROMUCOSAL | Status: DC | PRN

## 2020-09-26 MED ORDER — 0.9 % SODIUM CHLORIDE (POUR BTL) OPTIME
TOPICAL | Status: DC | PRN
  Administered 2020-09-26: 1000 mL

## 2020-09-26 MED ORDER — DEXAMETHASONE SODIUM PHOSPHATE 10 MG/ML IJ SOLN
INTRAMUSCULAR | Status: DC | PRN
  Administered 2020-09-26: 10 mg via INTRAVENOUS

## 2020-09-26 MED ORDER — BISACODYL 5 MG PO TBEC: 5.0000 mg | DELAYED_RELEASE_TABLET | Freq: Every day | ORAL | Status: DC | PRN

## 2020-09-26 MED ORDER — ASPIRIN EC 81 MG PO TBEC
81.0000 mg | DELAYED_RELEASE_TABLET | Freq: Every day | ORAL | Status: DC
  Administered 2020-09-26 – 2020-09-28 (×3): 81 mg via ORAL
  Filled 2020-09-26 (×3): qty 1

## 2020-09-26 MED ORDER — DEXAMETHASONE SODIUM PHOSPHATE 10 MG/ML IJ SOLN
INTRAMUSCULAR | Status: AC
  Filled 2020-09-26: qty 1

## 2020-09-26 MED ORDER — FENTANYL CITRATE (PF) 250 MCG/5ML IJ SOLN
INTRAMUSCULAR | Status: AC
  Filled 2020-09-26: qty 5

## 2020-09-26 MED ORDER — FENTANYL CITRATE (PF) 100 MCG/2ML IJ SOLN
25.0000 ug | INTRAMUSCULAR | Status: DC | PRN
  Administered 2020-09-26 (×2): 50 ug via INTRAVENOUS

## 2020-09-26 MED ORDER — ESMOLOL HCL 100 MG/10ML IV SOLN
INTRAVENOUS | Status: AC
  Filled 2020-09-26: qty 10

## 2020-09-26 MED ORDER — PHENYLEPHRINE HCL-NACL 10-0.9 MG/250ML-% IV SOLN
INTRAVENOUS | Status: DC | PRN
  Administered 2020-09-26: 20 ug/min via INTRAVENOUS

## 2020-09-26 MED ORDER — MAGNESIUM CHLORIDE 64 MG PO TBEC
1.0000 | DELAYED_RELEASE_TABLET | Freq: Two times a day (BID) | ORAL | Status: DC
  Administered 2020-09-27 – 2020-09-28 (×2): 64 mg via ORAL
  Filled 2020-09-26 (×5): qty 1

## 2020-09-26 MED ORDER — FENTANYL CITRATE (PF) 100 MCG/2ML IJ SOLN
INTRAMUSCULAR | Status: AC
  Filled 2020-09-26: qty 2

## 2020-09-26 MED ORDER — ACETAMINOPHEN 325 MG PO TABS: 650.0000 mg | ORAL_TABLET | ORAL | Status: DC | PRN

## 2020-09-26 MED ORDER — DOCUSATE SODIUM 100 MG PO CAPS
100.0000 mg | ORAL_CAPSULE | Freq: Two times a day (BID) | ORAL | Status: DC
  Administered 2020-09-26 – 2020-09-27 (×2): 100 mg via ORAL
  Filled 2020-09-26 (×2): qty 1

## 2020-09-26 MED ORDER — CHLORHEXIDINE GLUCONATE CLOTH 2 % EX PADS: 6.0000 | MEDICATED_PAD | Freq: Once | CUTANEOUS | Status: DC

## 2020-09-26 MED ORDER — ZOLPIDEM TARTRATE 5 MG PO TABS: 5.0000 mg | ORAL_TABLET | Freq: Every evening | ORAL | Status: DC | PRN

## 2020-09-26 MED ORDER — VENLAFAXINE HCL ER 75 MG PO CP24
150.0000 mg | ORAL_CAPSULE | Freq: Every day | ORAL | Status: DC
  Administered 2020-09-27 – 2020-09-28 (×2): 150 mg via ORAL
  Filled 2020-09-26 (×2): qty 2

## 2020-09-26 MED ORDER — ONDANSETRON HCL 4 MG/2ML IJ SOLN
INTRAMUSCULAR | Status: AC
  Filled 2020-09-26: qty 2

## 2020-09-26 MED ORDER — FENTANYL CITRATE (PF) 250 MCG/5ML IJ SOLN
INTRAMUSCULAR | Status: DC | PRN
  Administered 2020-09-26: 25 ug via INTRAVENOUS
  Administered 2020-09-26 (×3): 50 ug via INTRAVENOUS
  Administered 2020-09-26: 25 ug via INTRAVENOUS
  Administered 2020-09-26 (×6): 50 ug via INTRAVENOUS

## 2020-09-26 MED ORDER — THROMBIN 20000 UNITS EX SOLR
CUTANEOUS | Status: AC
  Filled 2020-09-26: qty 20000

## 2020-09-26 MED ORDER — ORAL CARE MOUTH RINSE: 15.0000 mL | Freq: Once | OROMUCOSAL | Status: AC

## 2020-09-26 MED ORDER — LOPERAMIDE HCL 2 MG/15ML PO SOLN
2.0000 mg | Freq: Every day | ORAL | Status: DC | PRN
  Filled 2020-09-26 (×2): qty 15

## 2020-09-26 MED ORDER — HYDROMORPHONE HCL 1 MG/ML IJ SOLN
INTRAMUSCULAR | Status: AC
  Filled 2020-09-26: qty 0.5

## 2020-09-26 MED ORDER — CHLORHEXIDINE GLUCONATE 0.12 % MT SOLN
15.0000 mL | Freq: Once | OROMUCOSAL | Status: AC
  Administered 2020-09-26: 15 mL via OROMUCOSAL
  Filled 2020-09-26: qty 15

## 2020-09-26 MED ORDER — THROMBIN 20000 UNITS EX SOLR: CUTANEOUS | Status: DC | PRN

## 2020-09-26 MED ORDER — BUPIVACAINE HCL (PF) 0.5 % IJ SOLN
INTRAMUSCULAR | Status: AC
  Filled 2020-09-26: qty 30

## 2020-09-26 MED ORDER — ACETAMINOPHEN 10 MG/ML IV SOLN
INTRAVENOUS | Status: AC
  Filled 2020-09-26: qty 100

## 2020-09-26 MED ORDER — PROPOFOL 10 MG/ML IV BOLUS
INTRAVENOUS | Status: DC | PRN
  Administered 2020-09-26: 150 mg via INTRAVENOUS

## 2020-09-26 MED ORDER — SODIUM CHLORIDE 0.9% FLUSH
3.0000 mL | Freq: Two times a day (BID) | INTRAVENOUS | Status: DC
  Administered 2020-09-26 – 2020-09-27 (×3): 3 mL via INTRAVENOUS

## 2020-09-26 MED ORDER — LIDOCAINE-EPINEPHRINE 0.5 %-1:200000 IJ SOLN
INTRAMUSCULAR | Status: DC | PRN
  Administered 2020-09-26: 10 mL

## 2020-09-26 MED ORDER — PROPOFOL 10 MG/ML IV BOLUS
INTRAVENOUS | Status: AC
  Filled 2020-09-26: qty 20

## 2020-09-26 MED ORDER — PRAVASTATIN SODIUM 10 MG PO TABS
20.0000 mg | ORAL_TABLET | Freq: Every day | ORAL | Status: DC
  Administered 2020-09-26 – 2020-09-27 (×2): 20 mg via ORAL
  Filled 2020-09-26 (×2): qty 2

## 2020-09-26 MED ORDER — OXYCODONE HCL 5 MG PO TABS
10.0000 mg | ORAL_TABLET | ORAL | Status: DC | PRN
  Administered 2020-09-26 – 2020-09-27 (×4): 10 mg via ORAL
  Filled 2020-09-26 (×4): qty 2

## 2020-09-26 MED ORDER — ROCURONIUM BROMIDE 10 MG/ML (PF) SYRINGE
PREFILLED_SYRINGE | INTRAVENOUS | Status: DC | PRN
  Administered 2020-09-26: 40 mg via INTRAVENOUS
  Administered 2020-09-26: 20 mg via INTRAVENOUS
  Administered 2020-09-26: 60 mg via INTRAVENOUS
  Administered 2020-09-26: 40 mg via INTRAVENOUS
  Administered 2020-09-26: 30 mg via INTRAVENOUS

## 2020-09-26 MED ORDER — PHENOL 1.4 % MT LIQD: 1.0000 | OROMUCOSAL | Status: DC | PRN

## 2020-09-26 MED ORDER — ACETAMINOPHEN 500 MG PO TABS
1000.0000 mg | ORAL_TABLET | Freq: Four times a day (QID) | ORAL | Status: AC
  Administered 2020-09-26 – 2020-09-27 (×3): 1000 mg via ORAL
  Filled 2020-09-26 (×3): qty 2

## 2020-09-26 MED ORDER — ONDANSETRON HCL 4 MG PO TABS: 4.0000 mg | ORAL_TABLET | Freq: Four times a day (QID) | ORAL | Status: DC | PRN

## 2020-09-26 MED ORDER — HEPARIN SODIUM (PORCINE) 5000 UNIT/ML IJ SOLN
5000.0000 [IU] | Freq: Three times a day (TID) | INTRAMUSCULAR | Status: DC
  Administered 2020-09-27 – 2020-09-28 (×5): 5000 [IU] via SUBCUTANEOUS
  Filled 2020-09-26 (×5): qty 1

## 2020-09-26 MED ORDER — MENTHOL 3 MG MT LOZG: 1.0000 | LOZENGE | OROMUCOSAL | Status: DC | PRN

## 2020-09-26 SURGICAL SUPPLY — 66 items
BENZOIN TINCTURE PRP APPL 2/3 (GAUZE/BANDAGES/DRESSINGS) IMPLANT
BLADE CLIPPER SURG (BLADE) IMPLANT
BUR MATCHSTICK NEURO 3.0 LAGG (BURR) ×2 IMPLANT
BUR PRECISION FLUTE 5.0 (BURR) ×2 IMPLANT
CAGE CORP CAPRI 17X22 34-42 0D (Cage) ×2 IMPLANT
CAGE CORP CAPRI TOWER 17X22X8 (Cage) ×2 IMPLANT
CANISTER SUCT 3000ML PPV (MISCELLANEOUS) ×2 IMPLANT
CARTRIDGE OIL MAESTRO DRILL (MISCELLANEOUS) ×1 IMPLANT
CNTNR URN SCR LID CUP LEK RST (MISCELLANEOUS) ×3 IMPLANT
CONT SPEC 4OZ STRL OR WHT (MISCELLANEOUS) ×3
COVER BACK TABLE 60X90IN (DRAPES) ×2 IMPLANT
COVER WAND RF STERILE (DRAPES) ×2 IMPLANT
DECANTER SPIKE VIAL GLASS SM (MISCELLANEOUS) ×2 IMPLANT
DERMABOND ADVANCED (GAUZE/BANDAGES/DRESSINGS) ×2
DERMABOND ADVANCED .7 DNX12 (GAUZE/BANDAGES/DRESSINGS) ×2 IMPLANT
DIFFUSER DRILL AIR PNEUMATIC (MISCELLANEOUS) ×2 IMPLANT
DRAPE C-ARM 42X72 X-RAY (DRAPES) ×4 IMPLANT
DRAPE C-ARMOR (DRAPES) ×2 IMPLANT
DRAPE LAPAROTOMY 100X72X124 (DRAPES) ×2 IMPLANT
DRAPE SURG 17X23 STRL (DRAPES) ×2 IMPLANT
DRSG OPSITE POSTOP 4X10 (GAUZE/BANDAGES/DRESSINGS) ×2 IMPLANT
DURAPREP 26ML APPLICATOR (WOUND CARE) ×2 IMPLANT
ELECT REM PT RETURN 9FT ADLT (ELECTROSURGICAL) ×2
ELECTRODE REM PT RTRN 9FT ADLT (ELECTROSURGICAL) ×1 IMPLANT
GAUZE 4X4 16PLY RFD (DISPOSABLE) ×2 IMPLANT
GAUZE SPONGE 4X4 12PLY STRL (GAUZE/BANDAGES/DRESSINGS) IMPLANT
GLOVE ECLIPSE 6.5 STRL STRAW (GLOVE) ×4 IMPLANT
GLOVE EXAM NITRILE XL STR (GLOVE) IMPLANT
GLOVE SURG LTX SZ7.5 (GLOVE) ×2 IMPLANT
GLOVE SURG POLYISO LF SZ7.5 (GLOVE) ×2 IMPLANT
GLOVE SURG UNDER POLY LF SZ7.5 (GLOVE) ×12 IMPLANT
GOWN STRL REUS W/ TWL LRG LVL3 (GOWN DISPOSABLE) ×3 IMPLANT
GOWN STRL REUS W/ TWL XL LVL3 (GOWN DISPOSABLE) IMPLANT
GOWN STRL REUS W/TWL 2XL LVL3 (GOWN DISPOSABLE) IMPLANT
GOWN STRL REUS W/TWL LRG LVL3 (GOWN DISPOSABLE) ×3
GOWN STRL REUS W/TWL XL LVL3 (GOWN DISPOSABLE)
KIT BASIN OR (CUSTOM PROCEDURE TRAY) ×2 IMPLANT
KIT POSITION SURG JACKSON T1 (MISCELLANEOUS) ×2 IMPLANT
KIT TURNOVER KIT B (KITS) ×2 IMPLANT
MILL MEDIUM DISP (BLADE) IMPLANT
NEEDLE HYPO 25X1 1.5 SAFETY (NEEDLE) ×2 IMPLANT
NEEDLE SPNL 18GX3.5 QUINCKE PK (NEEDLE) IMPLANT
NS IRRIG 1000ML POUR BTL (IV SOLUTION) ×2 IMPLANT
OIL CARTRIDGE MAESTRO DRILL (MISCELLANEOUS) ×2
PACK LAMINECTOMY NEURO (CUSTOM PROCEDURE TRAY) ×2 IMPLANT
PAD ARMBOARD 7.5X6 YLW CONV (MISCELLANEOUS) ×6 IMPLANT
PUTTY BONE 100 VESUVIUS 2.5CC (Putty) ×4 IMPLANT
ROD RELINE-O 5.5X300 STRT NS (Rod) ×1 IMPLANT
ROD RELINE-O 5.5X300MM STRT (Rod) ×1 IMPLANT
SCREW LOCK EXP FOR CAPRI CAGE (Screw) ×2 IMPLANT
SCREW LOCK RELINE 5.5 TULIP (Screw) ×16 IMPLANT
SCREW RELINE-O POLY 6.5X40 (Screw) ×16 IMPLANT
SPONGE LAP 4X18 RFD (DISPOSABLE) ×4 IMPLANT
SPONGE SURGIFOAM ABS GEL 100 (HEMOSTASIS) ×2 IMPLANT
STRIP CLOSURE SKIN 1/2X4 (GAUZE/BANDAGES/DRESSINGS) IMPLANT
SUT PROLENE 6 0 BV (SUTURE) IMPLANT
SUT SILK 0 TIES 10X30 (SUTURE) ×2 IMPLANT
SUT VIC AB 0 CT1 18XCR BRD8 (SUTURE) ×1 IMPLANT
SUT VIC AB 0 CT1 8-18 (SUTURE) ×1
SUT VIC AB 2-0 CT1 18 (SUTURE) ×4 IMPLANT
SUT VIC AB 3-0 SH 8-18 (SUTURE) ×8 IMPLANT
SYR CONTROL 10ML LL (SYRINGE) ×2 IMPLANT
TEMPLATE ROD SILICON 250 (ORTHOPEDIC DISPOSABLE SUPPLIES) ×2 IMPLANT
TOWEL GREEN STERILE (TOWEL DISPOSABLE) ×4 IMPLANT
TOWEL GREEN STERILE FF (TOWEL DISPOSABLE) ×2 IMPLANT
WATER STERILE IRR 1000ML POUR (IV SOLUTION) ×2 IMPLANT

## 2020-09-26 NOTE — H&P (Signed)
BP (!) 142/81   Pulse 99   Temp 98.1 F (36.7 C) (Oral)   Resp 18   Ht 5\' 10"  (1.778 m)   Wt 122.5 kg   SpO2 98%   BMI 38.74 kg/m  [name] comes in today for evaluation of severe pain that he has in his back and MRI evidence of an infiltrative mass in the T11 vertebral body.  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  He has been dealing with this pain since August 09, 2020.  He says lying on his left side will relieve the pain.  He says when he is sitting in a wheelchair his pain is 8/10.  He says he is unable to stand at this point, so he is unable to walk.  He reports that he is crawling to the bathroom.  The pain has gotten worse over the last 2 weeks.  He has no bowel or bladder incontinence.  He has had some small kidney stones.  He has lost weight, 15 to 20 pounds in the last month, which has not been intentional.     PAST MEDICAL HISTORY :  He had a urinary tract infection, sinus problems, shortness of breath, back pain, arthritis, kidney stones, anxiety, depression, and some inhalant allergies.     FAMILY HISTORY :  Mother and father are both deceased. Diabetes and heart disease present in the family history.     SOCIAL HISTORY :  He is 65 years of age, right-handed, and retired.  He does smoke and has smoked for 35 years.  He does not use alcohol.  He does have a history of substance abuse.  He did not make that substance known.     PAST SURGICAL HISTORY :  He has had a left knee surgery.  He has had a broken ankle.  He has had umbilical herniorrhaphy I believe at some point, he says mesh was placed.  He has had surgery for the kidney stones.     ALLERGIES :  He says penicillin and sulfur medications cause a rash.     MEDICATIONS :  Flomax, Bentyl, Prilosec, Probiotic, Baby Aspirin, Breo Ellipta, Pravastatin, Bupropion, Venlafaxine, Oxycodone, and Hydrocodone for pain.     WORKUP :  He had been worked up recently by Dr. Lin Espinoza of [facility]  secondary to the pain that he was having in his lower back and the idea that maybe this was due to his kidney stones.  So, it was Dr. Lin Espinoza that ordered the MRI of the thoracic spine which then identified the infiltrative lesion at T11.     PHYSICAL EXAMINATION :  He does have 5/5 strength in the lower extremities.  Normal dorsiflexion, plantar flexors, quadriceps, hamstrings, hip flexors, and hip extensors.  Proprioception is intact.  Reflexes are intact also at 2+.  He has normal reflexes and strength in the upper extremities.  Pupils equal, round, and reactive to light.  Full extraocular movements.  Full visual fields.  Hearing intact to voice.  Uvula elevates in midline.  Shoulder shrug is normal.  Tongue protrudes in the midline.     ASSESSMENT AND PLAN :  [name] on MRI has an infiltrative lesion that is extended into the epidural space around the spinal cord, causing some compression.  It does not appear to be in the adjacent segment or the like.  There is an abnormal kyphotic deformity at that level secondary to the mass and a collapse of the T11 vertebral body.  Cord signal is normal throughout.     I believe that [name] will need resection of this mass.  I believe this can be done via posterior approach.  There is already an infiltrative lesion where the pedicles are, so the mass can be followed down its own path.  I would plan on trying to do an interbody fixation and pedicle screw placement two above and two below.  I explained to him that this operation will hurt a lot, but that will improve with time.  This will also allow for a diagnosis to be made. As of now, no one knows what this lesion is.  I will also order MRIs of the cervical and lumbar regions, so that we can actually see that there is nothing except this isolated mass.  I explained the risks and benefits, possible paralysis, weakness, fusion failure, hardware failure, inability to resect the mass, bleeding, and  infection.  He understands those things, along with other risks that were mentioned.  He wishes to proceed.  We will get this scheduled for some time next week.

## 2020-09-26 NOTE — Anesthesia Procedure Notes (Signed)
Arterial Line Insertion Start/End5/03/2021 9:38 AM Performed by: Albertha Ghee, MD  Patient location: OR. Patient sedated Right, radial was placed Catheter size: 20 G Hand hygiene performed   Attempts: 1 Procedure performed using ultrasound guided technique. Following insertion, Biopatch and dressing applied.

## 2020-09-26 NOTE — Transfer of Care (Signed)
Immediate Anesthesia Transfer of Care Note  Patient: Viggo Perko  Procedure(s) Performed: Thoracic eleven Corpectomy with Thoracic nine to Lumbar one arthrodesis and pedicle screws (N/A Spine Thoracic)  Patient Location: PACU  Anesthesia Type:General  Level of Consciousness: drowsy and patient cooperative  Airway & Oxygen Therapy: Patient Spontanous Breathing and Patient connected to face mask oxygen  Post-op Assessment: Report given to RN and Post -op Vital signs reviewed and stable  Post vital signs: Reviewed  Last Vitals:  Vitals Value Taken Time  BP 135/98 09/26/20 1551  Temp 36.8 C 09/26/20 1551  Pulse 105 09/26/20 1558  Resp 18 09/26/20 1558  SpO2 100 % 09/26/20 1558  Vitals shown include unvalidated device data.  Last Pain:  Vitals:   09/26/20 0656  TempSrc:   PainSc: 2       Patients Stated Pain Goal: 2 (12/31/[birth year] 1856)  Complications: No complications documented.

## 2020-09-26 NOTE — Progress Notes (Signed)
Pt home cpap set up and ready for use. °

## 2020-09-26 NOTE — Anesthesia Preprocedure Evaluation (Signed)
Anesthesia Evaluation  Patient identified by MRN, date of birth, ID band Patient awake    Reviewed: Allergy & Precautions, H&P , NPO status , Patient's Chart, lab work & pertinent test results  Airway Mallampati: II   Neck ROM: full    Dental   Pulmonary asthma , sleep apnea , Current Smoker,    breath sounds clear to auscultation       Cardiovascular negative cardio ROS   Rhythm:regular Rate:Normal     Neuro/Psych PSYCHIATRIC DISORDERS Anxiety Depression    GI/Hepatic hiatal hernia, GERD  ,  Endo/Other    Renal/GU      Musculoskeletal  (+) Arthritis ,   Abdominal   Peds  Hematology   Anesthesia Other Findings   Reproductive/Obstetrics                             Anesthesia Physical Anesthesia Plan  ASA: III  Anesthesia Plan: General   Post-op Pain Management:    Induction: Intravenous  PONV Risk Score and Plan: 1 and Ondansetron, Dexamethasone, Midazolam and Treatment may vary due to age or medical condition  Airway Management Planned: Oral ETT  Additional Equipment:   Intra-op Plan:   Post-operative Plan: Extubation in OR  Informed Consent: I have reviewed the patients History and Physical, chart, labs and discussed the procedure including the risks, benefits and alternatives for the proposed anesthesia with the patient or authorized representative who has indicated his/her understanding and acceptance.     Dental advisory given  Plan Discussed with: CRNA, Anesthesiologist and Surgeon  Anesthesia Plan Comments:         Anesthesia Quick Evaluation

## 2020-09-26 NOTE — Anesthesia Procedure Notes (Signed)
Procedure Name: Intubation Date/Time: 09/26/2020 9:16 AM Performed by: Janene Harvey, CRNA Pre-anesthesia Checklist: Patient identified, Emergency Drugs available, Suction available and Patient being monitored Patient Re-evaluated:Patient Re-evaluated prior to induction Oxygen Delivery Method: Circle system utilized Preoxygenation: Pre-oxygenation with 100% oxygen Induction Type: IV induction Ventilation: Mask ventilation without difficulty and Oral airway inserted - appropriate to patient size Laryngoscope Size: Mac and 4 Grade View: Grade I Tube type: Oral Tube size: 7.5 mm Number of attempts: 1 Airway Equipment and Method: Stylet and Oral airway Placement Confirmation: ETT inserted through vocal cords under direct vision,  positive ETCO2 and breath sounds checked- equal and bilateral Secured at: 22 cm Tube secured with: Tape Dental Injury: Teeth and Oropharynx as per pre-operative assessment

## 2020-09-27 LAB — SURGICAL PATHOLOGY

## 2020-09-27 MED ORDER — HYDROCODONE-ACETAMINOPHEN 7.5-325 MG PO TABS
1.0000 | ORAL_TABLET | ORAL | Status: DC | PRN
  Administered 2020-09-27 – 2020-09-28 (×9): 1 via ORAL
  Filled 2020-09-27 (×9): qty 1

## 2020-09-27 MED ORDER — ALUM & MAG HYDROXIDE-SIMETH 200-200-20 MG/5ML PO SUSP
30.0000 mL | Freq: Four times a day (QID) | ORAL | Status: DC | PRN
  Administered 2020-09-27: 30 mL via ORAL
  Filled 2020-09-27: qty 30

## 2020-09-27 NOTE — Evaluation (Signed)
Physical Therapy Evaluation Patient Details Name: Andrew Espinoza MRN: [id number] DOB: Jul 08, 1955 Today's Date: 09/27/2020   History of Present Illness  Pt is a 65 year old man, MRI shows infiltrative lesion that is extended into the epidural space around the spinal cord, causing some compression at T11. Patient underwent 4 level surgical intervention 5/11, however, operative note is not in at the time of PT eval.    Clinical Impression  Pt admitted with above diagnosis. At the time of PT eval, pt was able to demonstrate transfers and ambulation with gross min guard assist and RW for support. Pt was educated on precautions, appropriate activity progression, and car transfer. Pt currently with functional limitations due to the deficits listed below (see PT Problem List). Pt will benefit from skilled PT to increase their independence and safety with mobility to allow discharge to the venue listed below.      Follow Up Recommendations Home health PT;Supervision for mobility/OOB    Equipment Recommendations  Rolling walker with 5" wheels    Recommendations for Other Services       Precautions / Restrictions Precautions Precautions: Back Precaution Booklet Issued: Yes (comment) Precaution Comments: Reviewed handout and pt was cued for precautions during functional mobility. Required Braces or Orthoses:  (No brace needed order) Restrictions Weight Bearing Restrictions: No Other Position/Activity Restrictions: no brace      Mobility  Bed Mobility Overal bed mobility: Needs Assistance Bed Mobility: Rolling;Sidelying to Sit;Sit to Sidelying Rolling: Supervision Sidelying to sit: Supervision     Sit to sidelying: Supervision General bed mobility comments: HOB elevated with rails lowered to simulate home environment.    Transfers Overall transfer level: Needs assistance Equipment used: Rolling walker (2 wheeled) Transfers: Sit to/from Stand Sit to Stand: Min guard         Lateral/Scoot Transfers: Min guard General transfer comment: Close guard for safety as pt powered up to full standing position. VC's for hand placement on seated surface.  Ambulation/Gait Ambulation/Gait assistance: Min guard Gait Distance (Feet): 175 Feet Assistive device: Rolling walker (2 wheeled) Gait Pattern/deviations: Step-through pattern;Decreased stride length;Trunk flexed Gait velocity: Decreased Gait velocity interpretation: <1.31 ft/sec, indicative of household ambulator General Gait Details: VC's for improved posutre, closer walker proximity, and forward gaze. Pt reports difficulty keeping head up due to fatigue and pain. No LOB noted, however hands-on guarding provided for safety.  Stairs            Wheelchair Mobility    Modified Rankin (Stroke Patients Only)       Balance Overall balance assessment: Needs assistance Sitting-balance support: Feet supported;Bilateral upper extremity supported Sitting balance-Leahy Scale: Fair     Standing balance support: Bilateral upper extremity supported Standing balance-Leahy Scale: Poor Standing balance comment: relies on external support currently                             Pertinent Vitals/Pain Pain Assessment: Faces Pain Score: 10-Worst pain ever Faces Pain Scale: Hurts whole lot Pain Descriptors / Indicators: Aching;Constant;Grimacing;Restless;Operative site guarding Pain Intervention(s): Limited activity within patient's tolerance;Monitored during session;Repositioned    Home Living Family/patient expects to be discharged to:: Private residence Living Arrangements: Other relatives Available Help at Discharge: Family;Available 24 hours/day Type of Home: House Home Access: Stairs to enter;Ramped entrance   Entrance Stairs-Number of Steps: 3 STE side door into kitchen - typically he crawls backwards to exit the home, and crawls forward to enter the home.  When he walked,  he admits to being severly  hunched. Home Layout: One level Home Equipment: Wheelchair - manual;Shower seat - built in;Hand held shower head Additional Comments: Neither w/c is at his home currently.    Prior Function Level of Independence: Needs assistance   Gait / Transfers Assistance Needed: generally has been crawling over the past two weeks.  walks severely hunched.  ADL's / Homemaking Assistance Needed: brother assisted him with showers and community mobility.  sister assisted with home management and meals.        Hand Dominance   Dominant Hand: Right    Extremity/Trunk Assessment   Upper Extremity Assessment Upper Extremity Assessment: Defer to OT evaluation    Lower Extremity Assessment Lower Extremity Assessment: Generalized weakness    Cervical / Trunk Assessment Cervical / Trunk Assessment: Kyphotic;Other exceptions Cervical / Trunk Exceptions: spinal surgery  Communication   Communication: No difficulties  Cognition Arousal/Alertness: Awake/alert Behavior During Therapy: WFL for tasks assessed/performed Overall Cognitive Status: Within Functional Limits for tasks assessed                                        General Comments      Exercises     Assessment/Plan    PT Assessment Patient needs continued PT services  PT Problem List Decreased strength;Decreased activity tolerance;Decreased balance;Decreased mobility;Decreased safety awareness;Decreased knowledge of use of DME;Decreased knowledge of precautions;Pain       PT Treatment Interventions DME instruction;Gait training;Functional mobility training;Therapeutic exercise;Therapeutic activities;Neuromuscular re-education;Patient/family education    PT Goals (Current goals can be found in the Care Plan section)  Acute Rehab PT Goals Patient Stated Goal: be comfortable to do more for myself PT Goal Formulation: With patient Time For Goal Achievement: 10/04/20 Potential to Achieve Goals: Good    Frequency  Min 5X/week   Barriers to discharge        Co-evaluation               AM-PAC PT "6 Clicks" Mobility  Outcome Measure Help needed turning from your back to your side while in a flat bed without using bedrails?: A Little Help needed moving from lying on your back to sitting on the side of a flat bed without using bedrails?: A Little Help needed moving to and from a bed to a chair (including a wheelchair)?: A Little Help needed standing up from a chair using your arms (e.g., wheelchair or bedside chair)?: A Little Help needed to walk in hospital room?: A Little Help needed climbing 3-5 steps with a railing? : A Little 6 Click Score: 18    End of Session Equipment Utilized During Treatment: Gait belt Activity Tolerance: Patient limited by pain;Patient limited by fatigue Patient left: in bed;with call bell/phone within reach Nurse Communication: Mobility status PT Visit Diagnosis: Unsteadiness on feet (R26.81);Pain Pain - part of body:  (back)    Time: 2992-4268 PT Time Calculation (min) (ACUTE ONLY): 23 min   Charges:   PT Evaluation $PT Eval Moderate Complexity: 1 Mod PT Treatments $Gait Training: 8-22 mins        Rolinda Roan, PT, DPT Acute Rehabilitation Services Pager: [phone] Office: [place] 09/27/2020, 1:18 PM

## 2020-09-27 NOTE — TOC Initial Note (Addendum)
Transition of Care [facility]) - Initial/Assessment Note    Patient Details  Name: Andrew Espinoza MRN: [id number] Date of Birth: 09/15/1955  Transition of [facility]) CM/SW Contact:    Andrew Chars, LCSW Phone Number: 09/27/2020, 12:33 PM  Clinical Narrative:    CSW spoke with pt by phone, pt agreeable to [facility] referral.  Pt lives alone but says he has several siblings who will be staying with him after DC.  Permission given to speak to siblings. CSW discussed choice regarding HH, may be complicated due to pt location in [place] and payer source. Pt does not have preference for agency and open to provider.  PO box listed as address--pt reports his physical address is  [address], [place], [place] [address].    CSW spoke to the following [facility] agencies:  [facility]: does not service [employer]: does not service [facility]: declined referral [facility]: declined referral     [facility]: declined referral  CSW spoke with pt who is agreeable to going to outpt PT at [facility] in [place].  Pt reports he has transportation and that the hospital is close to his home. CSW spoke with intake there [phone]), they need face sheet, dx, and orders faxed to [phone].             Expected Discharge Plan: [place] [facility] Barriers to Discharge: Other (comment) ([facility] services in [facility])   Patient Goals and CMS Choice Patient states their goals for this hospitalization and ongoing recovery are:: get back to walking without pain      Expected Discharge Plan and Services Expected Discharge Plan: [place] In-house Referral: Clinical Social Work   Post Acute Care Choice: [place] arrangements for the past 2 months: [place]                 DME Arranged: N/A (DME through [facility] staff)                    Prior Living Arrangements/Services Living arrangements for the past 2 months: Single Family  Home Lives with:: Self Patient language and need for interpreter reviewed:: Yes Do you feel safe going back to the place where you live?: Yes      Need for Family Participation in Patient Care: Yes (Comment) Care giver support system in place?: Yes (comment) Current home services: Other (comment) (none) Criminal Activity/Legal Involvement Pertinent to Current Situation/Hospitalization: No - Comment as needed  Activities of Daily Living      Permission Sought/Granted Permission sought to share information with : Family Supports,Other (comment) Permission granted to share information with : Yes, Verbal Permission Granted  Share Information with NAME: brother Andrew Espinoza, sister Andrew Espinoza  Permission granted to share info w AGENCY: [facility]        Emotional Assessment Appearance::  (phone assessment, unable to asses) Attitude/Demeanor/Rapport: Engaged Affect (typically observed): Appropriate,Pleasant Orientation: : Oriented to Self,Oriented to Place,Oriented to  Time,Oriented to Situation Alcohol / Substance Use: Not Applicable Psych Involvement: No (comment)  Admission diagnosis:  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

## 2020-09-27 NOTE — Op Note (Signed)
09/26/2020  8:44 PM  PATIENT:  Andrew Espinoza  65 y.o. male with a tumor at T11 and spinal instability. He is taken to the operating room for a corpectomy and spinal stabilization.   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  BLOOD ADMINISTERED:none  COUNT:per nursing  DRAINS: none   SPECIMEN:  Source of Specimen:  T11 corpus  DICTATION: [name] was taken to the operating room, intubated, and placed under a general anesthetic without difficulty. A foley catheter was placed under sterile conditions. He was positioned prone on a Jackson table with all pressure points padded. His back was prepped and draped in a sterile manner. Using fluoroscopy I planned my incision to expose the T9-L1 pedicles, and lamina.  I incised the skin with a bard parker 10 blade and dissected sharply to the thoracolumbar fascia. I used monopolar cautery to expose the lamina of T9 through L1. At T10 my suction fell through soft tissue into where the vertebral body should have been. The brownish grey tumor was identified laterally on the right initially.  I performed the corpectomy via a posterior approach. The pedicles were replaced by tumor as was most of the vertebral body. I and Dr. Marcello Moores were able to remove the tumor in gross total fashion with suction, and pituitary rongeurs, controlling bleeding with intermittent packing, and cautery.  We fully decompressed the spinal canal by also removing the spinous process and lamina of T11.  The 10/11 disk and 11/12 discs were  removed to expose the endplates of N05 and L97. When we had removed the tumor we started to plan the the corpectomy cage placement. The T11 nerve root was tied off, and divided sharply. This allowed for enough space to place an expandable titanium cage into the space between T10 and T12. I performed that with fluoroscopic guidance and achieved a satisfactory position of the cage. I then placed pedicle screws at T9,10,12, and L1. All screws were placed in a similar fashion. In the AP view with fluoroscopy I located each pedicle, drilled a starting position for the bone probes, and cannulated the pedicles in the AP view. I checked for length with a lateral view. All screws were placed with no cortical breaches. Rods were placed through the screwheads, then secured with locking caps. I infiltrated the paraspinous musculature with marcaine. Bone chips placed laterally to complete the arthrodesis.  I closed the incision by approximating the thoracolumbar, subcutaneous, and subcuticular planes. I used dermabond and an occlusive bandage  PLAN OF CARE: Admit to inpatient   PATIENT DISPOSITION:  PACU - hemodynamically stable.   Delay start of Pharmacological VTE agent (>24hrs) due to surgical blood loss or risk of bleeding:  no

## 2020-09-27 NOTE — Evaluation (Signed)
Occupational Therapy Evaluation Patient Details Name: Andrew Espinoza MRN: [id number] DOB: 07/21/1955 Today's Date: 09/27/2020    History of Present Illness 64 year old man, MRI shows infiltrative lesion that is extended into the epidural space around the spinal cord, causing some compression T11.  Patient underwent surgical intervention 5/11, however, operative note is not in.   Clinical Impression   Patient admitted for the above diagnosis.  PTA the patient states he needed assist with ADL, IADL, and community mobility.  His siblings will be providing assist as needed once he returns home.  Barriers are listed below.  Currently he is needing up to Mod A for lower body ADL, and up to Min A for basic mobility.  OT will continue to follow him in the acute setting to maximize his functional status and ensure compliance with back precautions.      Follow Up Recommendations  No OT follow up    Equipment Recommendations  Tub/shower seat;Other (comment) (hip kit)    Recommendations for Other Services       Precautions / Restrictions Precautions Precautions: Back Precaution Booklet Issued: Yes (comment) Restrictions Weight Bearing Restrictions: No Other Position/Activity Restrictions: no brace      Mobility Bed Mobility Overal bed mobility: Needs Assistance Bed Mobility: Rolling;Sidelying to Sit;Sit to Sidelying Rolling: Supervision Sidelying to sit: Min assist     Sit to sidelying: Min assist   Patient Response: Cooperative  Transfers Overall transfer level: Needs assistance   Transfers: Sit to/from Stand;Lateral/Scoot Transfers Sit to Stand: Min assist        Lateral/Scoot Transfers: Min guard      Balance Overall balance assessment: Needs assistance Sitting-balance support: Feet supported;Bilateral upper extremity supported Sitting balance-Leahy Scale: Fair     Standing balance support: Bilateral upper extremity supported Standing balance-Leahy Scale: Poor Standing  balance comment: relies on external support currently                           ADL either performed or assessed with clinical judgement   ADL Overall ADL's : Needs assistance/impaired Eating/Feeding: Independent;Bed level   Grooming: Wash/dry hands;Wash/dry face;Min guard;Standing           Upper Body Dressing : Minimal assistance;Sitting   Lower Body Dressing: Maximal assistance;Sit to/from stand                       Vision Baseline Vision/History: Wears glasses Wears Glasses: At all times Patient Visual Report: No change from baseline       Perception     Praxis      Pertinent Vitals/Pain Pain Assessment: 0-10 Pain Score: 10-Worst pain ever Pain Descriptors / Indicators: Aching;Constant;Grimacing;Guarding;Shooting;Sharp;Restless Pain Intervention(s): Monitored during session     Hand Dominance Right   Extremity/Trunk Assessment Upper Extremity Assessment Upper Extremity Assessment: Overall WFL for tasks assessed   Lower Extremity Assessment Lower Extremity Assessment: Defer to PT evaluation   Cervical / Trunk Assessment Cervical / Trunk Assessment: Kyphotic;Other exceptions Cervical / Trunk Exceptions: spinal surgery   Communication Communication Communication: No difficulties   Cognition Arousal/Alertness: Awake/alert Behavior During Therapy: WFL for tasks assessed/performed Overall Cognitive Status: Within Functional Limits for tasks assessed                                                        Home Living Family/patient expects to be discharged to:: Private residence Living Arrangements: Other relatives Available Help at Discharge: Family;Available 24 hours/day Type of Home: House Home Access: Stairs to enter;Ramped entrance Entrance Stairs-Number of Steps: 3 STE side door into kitchen - typically he crawls backwards to exit the home, and crawls forward to enter the home.  When he walked, he admits to  being severly hunched.   Home Layout: One level     Bathroom Shower/Tub: Tub/shower unit;Walk-in shower   Bathroom Toilet: Handicapped height Bathroom Accessibility: Yes How Accessible: Accessible via walker Home Equipment: Wheelchair - manual;Shower seat - built in;Hand held shower head   Additional Comments: Niether w/c is at his home currenlty.      Prior Functioning/Environment Level of Independence: Needs assistance  Gait / Transfers Assistance Needed: generally has been crawling over the past two weeks.  walks severly hunched. ADL's / Homemaking Assistance Needed: brother assisted him with showers and community mobility.  sister assisted with home management and meals.            OT Problem List: Impaired balance (sitting and/or standing);Decreased knowledge of use of DME or AE;Pain      OT Treatment/Interventions: Self-care/ADL training;DME and/or AE instruction;Balance training;Therapeutic activities    OT Goals(Current goals can be found in the care plan section) Acute Rehab OT Goals Patient Stated Goal: be comflrtable to do more for myself OT Goal Formulation: With patient Time For Goal Achievement: 10/11/20 Potential to Achieve Goals: Good ADL Goals Pt Will Perform Grooming: with supervision;standing;sitting Pt Will Perform Lower Body Bathing: with supervision;with adaptive equipment;sit to/from stand Pt Will Perform Lower Body Dressing: with supervision;with adaptive equipment;sit to/from stand Pt Will Transfer to Toilet: with supervision;ambulating;regular height toilet Pt Will Perform Toileting - Clothing Manipulation and hygiene: with supervision;sit to/from stand  OT Frequency: Min 2X/week   Barriers to D/C:    none noted       Co-evaluation              AM-PAC OT "6 Clicks" Daily Activity     Outcome Measure Help from another person eating meals?: None Help from another person taking care of personal grooming?: None Help from another person  toileting, which includes using toliet, bedpan, or urinal?: A Little Help from another person bathing (including washing, rinsing, drying)?: A Lot Help from another person to put on and taking off regular upper body clothing?: A Little Help from another person to put on and taking off regular lower body clothing?: A Lot 6 Click Score: 18   End of Session Nurse Communication: Mobility status;Patient requests pain meds  Activity Tolerance: Patient tolerated treatment well;Patient limited by pain Patient left: in bed;with call bell/phone within reach  OT Visit Diagnosis: Unsteadiness on feet (R26.81);Pain                Time: 0858-0920 OT Time Calculation (min): 22 min Charges:  OT General Charges $OT Visit: 1 Visit OT Evaluation $OT Eval Moderate Complexity: 1 Mod  09/27/2020  Rich, OTR/L  Acute Rehabilitation Services  Office:  [phone]   Richard D Popella 09/27/2020, 9:33 AM 

## 2020-09-27 NOTE — Progress Notes (Signed)
Patient ID: Andrew Espinoza, male   DOB: 10-Sep-1955, 65 y.o.   MRN: [id number] BP 129/78 (BP Location: Right Arm)   Pulse 100   Temp 98.6 F (37 C) (Oral)   Resp 20   Ht 5\' 10"  (1.778 m)   Wt 122.5 kg   SpO2 94%   BMI 38.74 kg/m  Alert and oriented x 4, speech is clear and fluent Moving all extremities well Wound is clean, no signs of infection  Possible discharge tomorrow Final dx plasma cell tumor Will arrange for oncology and rad onc to see him as outpatient

## 2020-09-28 ENCOUNTER — Other Ambulatory Visit: Payer: Self-pay | Admitting: Radiation Therapy

## 2020-09-28 MED ORDER — OXYCODONE HCL 5 MG PO TABS: 5.0000 mg | ORAL_TABLET | Freq: Four times a day (QID) | ORAL | 0 refills | Status: AC | PRN

## 2020-09-28 MED ORDER — HYDROCODONE-ACETAMINOPHEN 7.5-325 MG PO TABS: 1.0000 | ORAL_TABLET | ORAL | 0 refills | Status: AC | PRN

## 2020-09-28 MED ORDER — TIZANIDINE HCL 4 MG PO TABS: 4.0000 mg | ORAL_TABLET | Freq: Four times a day (QID) | ORAL | 0 refills | Status: DC | PRN

## 2020-09-28 NOTE — Progress Notes (Signed)
Patient alert and oriented, voiding adequately, skin clean, dry and intact without evidence of skin break down, or symptoms of complications - no redness or edema noted, only slight tenderness at site.  Patient states pain is manageable at time of discharge. Patient has an appointment with MD in 3 weeks 

## 2020-09-28 NOTE — Progress Notes (Signed)
Physical Therapy Treatment Patient Details Name: Andrew Espinoza MRN: [id number] DOB: 1956-01-06 Today's Date: 09/28/2020    History of Present Illness Pt is a 65 year old man, MRI shows infiltrative lesion that is extended into the epidural space around the spinal cord, causing some compression at T11. Patient underwent 4 level surgical intervention 5/11, however, operative note is not in at the time of PT eval.    PT Comments    Pt progressing well with post-op mobility. He was able to demonstrate transfers and ambulation with gross min guard assist and RW for support. Pt was educated on precautions, positioning recommendations, appropriate activity progression, and car transfer. Will continue to follow.      Follow Up Recommendations  Home health PT;Supervision for mobility/OOB     Equipment Recommendations  Rolling walker with 5" wheels    Recommendations for Other Services       Precautions / Restrictions Precautions Precautions: Back Precaution Booklet Issued: Yes (comment) Precaution Comments: Reviewed handout and pt was cued for precautions during functional mobility. Required Braces or Orthoses:  (No brace needed order) Restrictions Weight Bearing Restrictions: No    Mobility  Bed Mobility Overal bed mobility: Needs Assistance Bed Mobility: Rolling;Sidelying to Sit;Sit to Sidelying Rolling: Supervision Sidelying to sit: Supervision     Sit to sidelying: Supervision General bed mobility comments: HOB elevated with rails lowered to simulate home environment. Pt with increased pain rolling but transitioned to sitting well without assist.    Transfers Overall transfer level: Needs assistance Equipment used: Rolling walker (2 wheeled) Transfers: Sit to/from Stand Sit to Stand: Min guard         General transfer comment: Close guard for safety as pt powered up to full standing position. VC's for hand placement on seated surface.  Ambulation/Gait Ambulation/Gait  assistance: Min guard Gait Distance (Feet): 200 Feet Assistive device: Rolling walker (2 wheeled) Gait Pattern/deviations: Step-through pattern;Decreased stride length;Trunk flexed Gait velocity: Decreased Gait velocity interpretation: 1.31 - 2.62 ft/sec, indicative of limited community ambulator General Gait Details: VC's for improved posutre, closer walker proximity, and forward gaze. 1 standing rest break as pt attempted to laterally lean over the walker to the wall. VC's to return to upright posture to avoid breaking back precautions. No LOB noted, however hands-on guarding provided for safety.   Stairs  Pt was able to negotiate 3 stairs with min guard assist. Multimodal cues for sideways negotiation to allow for B UE support on the L railing.            Wheelchair Mobility    Modified Rankin (Stroke Patients Only)       Balance Overall balance assessment: Needs assistance Sitting-balance support: Feet supported;Bilateral upper extremity supported Sitting balance-Leahy Scale: Fair     Standing balance support: Bilateral upper extremity supported Standing balance-Leahy Scale: Poor Standing balance comment: relies on external support currently                            Cognition Arousal/Alertness: Awake/alert Behavior During Therapy: WFL for tasks assessed/performed Overall Cognitive Status: Within Functional Limits for tasks assessed                                        Exercises      General Comments        Pertinent Vitals/Pain Pain Assessment: 0-10 Pain Score: 5  Pain Descriptors / Indicators: Aching;Constant;Grimacing;Restless;Operative site guarding Pain Intervention(s): Limited activity within patient's tolerance;Monitored during session;Repositioned    Home Living                      Prior Function            PT Goals (current goals can now be found in the care plan section) Acute Rehab PT Goals Patient  Stated Goal: be comfortable to do more for myself PT Goal Formulation: With patient Time For Goal Achievement: 10/04/20 Potential to Achieve Goals: Good Progress towards PT goals: Progressing toward goals    Frequency    Min 5X/week      PT Plan Current plan remains appropriate    Co-evaluation              AM-PAC PT "6 Clicks" Mobility   Outcome Measure  Help needed turning from your back to your side while in a flat bed without using bedrails?: A Little Help needed moving from lying on your back to sitting on the side of a flat bed without using bedrails?: A Little Help needed moving to and from a bed to a chair (including a wheelchair)?: A Little Help needed standing up from a chair using your arms (e.g., wheelchair or bedside chair)?: A Little Help needed to walk in hospital room?: A Little Help needed climbing 3-5 steps with a railing? : A Little 6 Click Score: 18    End of Session Equipment Utilized During Treatment: Gait belt Activity Tolerance: Patient limited by pain;Patient limited by fatigue Patient left: in bed;with call bell/phone within reach Nurse Communication: Mobility status PT Visit Diagnosis: Unsteadiness on feet (R26.81);Pain Pain - part of body:  (back)     Time: 1275-1700 PT Time Calculation (min) (ACUTE ONLY): 21 min  Charges:  $Gait Training: 8-22 mins                     Rolinda Roan, PT, DPT Acute Rehabilitation Services Pager: [phone] Office: [place] 09/28/2020, 9:15 AM

## 2020-09-28 NOTE — TOC Progression Note (Signed)
Transition of Care [facility]) - Progression Note    Patient Details  Name: Andrew Espinoza MRN: [id number] Date of Birth: 06-Dec-1955  Transition of [facility]) CM/SW Contact  Joanne Chars, LCSW Phone Number: 09/28/2020, 4:10 PM  Clinical Narrative:   MD in agreement with plan for outpt PT.  Orders not yet available, will ask RN to fax to PT clinic at [facility] in [place].     Expected Discharge Plan: [place] [facility] Barriers to Discharge: Other (comment) ([facility] in [place])  Expected Discharge Plan and Services Expected Discharge Plan: [place] In-house Referral: Clinical Social Work   Post Acute Care Choice: [place] arrangements for the past 2 months: Single Family Home Expected Discharge Date: 09/28/20               DME Arranged: N/A (DME through [facility] staff)                     Social Determinants of Health (SDOH) Interventions    Readmission Risk Interventions No flowsheet data found.

## 2020-09-28 NOTE — Discharge Summary (Signed)
Physician Discharge Summary  Patient ID: Andrew Espinoza MRN: [id number] DOB/AGE: [birth year] 65 y.o.  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 Macaraeg was admitted and taken to the operating room for a T11 corpectomy, interbody cage placement, and posterior arthrodesis with pedicle screw fixation T9-L1. Post op he ambulated, voided, and tolerated a regular diet. His wound is clean, dry, and without signs of infection.   Treatments: surgery: Thoracic eleven Corpectomy, Interbody cage placement T10-T12([facility]) Thoracic nine to Lumbar one arthrodesis Segmental pedicle screw fixation T9-L1, Nuvasive Relign  Discharge Exam: Blood pressure (!) 141/87, pulse (!) 105, temperature 98.4 F (36.9 C), temperature source Oral, resp. rate 18, height 5\' 10"  (1.778 m), weight 122.5 kg, SpO2 98 %. 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   aspirin 81 MG EC tablet Take 81 mg by mouth daily.   Breo Ellipta 100-25 MCG/INH Aepb Generic drug: fluticasone furoate-vilanterol Inhale 1 puff into the lungs daily.   buPROPion 150 MG 24 hr tablet Commonly known as: WELLBUTRIN XL Take 150 mg by mouth daily.   dicyclomine 10 MG capsule Commonly known as: BENTYL Take 10 mg by mouth 3 (three)  times daily as needed for spasms.   HYDROcodone-acetaminophen 7.5-325 MG tablet Commonly known as: NORCO Take 1 tablet by mouth every 4 (four) hours as needed for up to 7 days for moderate pain. What changed:   when to take this  reasons to take this   Loperamide HCl 1 MG/7.5ML Liqd Take 2 mg by mouth daily as needed (diarrhea or loose stools).   Magnesium 100 MG Tabs Take 100 mg by mouth in the morning and at bedtime.   omeprazole 20 MG tablet Commonly known as: PRILOSEC OTC Take 20 mg by mouth daily.   oxyCODONE 5 MG immediate release tablet Commonly known as: Roxicodone Take 1 tablet (5 mg total) by mouth every 6 (six) hours as needed for up to 8 days for severe pain.   pravastatin 20 MG tablet Commonly known as: PRAVACHOL Take 20 mg by mouth daily.   tamsulosin 0.4 MG Caps capsule Commonly known as: FLOMAX Take 0.8 mg by mouth daily.   tiZANidine 4 MG tablet Commonly known as: ZANAFLEX Take 1 tablet (4 mg total) by mouth every 6 (six) hours as needed for muscle spasms.   venlafaxine XR 150 MG 24 hr capsule Commonly known as: EFFEXOR-XR Take 150 mg by mouth daily.            Durable Medical Equipment  (From admission, onward)         Start     Ordered   09/28/20 1552  For home use only DME Walker  Once       Question:  Patient needs a walker to treat with  the following condition  Answer:  Unsteady gait when walking   09/28/20 1552          Follow-up Information    Ashok Pall, MD Follow up in 3 week(s).   Specialty: Neurosurgery Why: please call to make an appointment Contact information: [address] [place] [address] [phone]        [facility] Follow up.   Contact information: [address], [place], [place] [address]              Signed: Ashok Pall 09/28/2020, 3:59 PM

## 2020-09-28 NOTE — Plan of Care (Signed)
Adequately ready for discharge 

## 2020-09-28 NOTE — Progress Notes (Signed)
Occupational Therapy Treatment Patient Details Name: Andrew Espinoza MRN: [id number] DOB: July 12, 1955 Today's Date: 09/28/2020    History of present illness Pt is a 64 year old man, MRI shows infiltrative lesion that is extended into the epidural space around the spinal cord, causing some compression at T11. Patient underwent 4 level surgical intervention 5/11, however, operative note is not in at the time of PT eval.   OT comments  Patient has progressed nicely with mobility in the room/toileting, needing up to supervision and a RW.  His ADL status remains at a Min A to Mod A level due to back pain and poor tolerance for activity.  It is expected as his pain becomes more controlled, he will progress his ADL independence.  Hip kit training completed, and he is preparing for discharge home.  His family will be there to provide the necessary assist, but [facility] OT may be beneficial for carryover and continued progression of safe ADL completion.  No further OT needs in the acute setting, all questions answered.    Follow Up Recommendations  Home health OT    Equipment Recommendations  Tub/shower seat;Other (comment)    Recommendations for Other Services      Precautions / Restrictions Precautions Precautions: Back Precaution Booklet Issued: Yes (comment) Precaution Comments: Reviewed handout and pt was cued for precautions during functional mobility. Required Braces or Orthoses:  (No brace needed order) Restrictions Weight Bearing Restrictions: No Other Position/Activity Restrictions: no brace       Mobility Bed Mobility Overal bed mobility: Needs Assistance Bed Mobility: Rolling;Sidelying to Sit;Sit to Sidelying Rolling: Supervision Sidelying to sit: Supervision     Sit to sidelying: Supervision General bed mobility comments: HOB elevated with rails lowered to simulate home environment. Pt with increased pain rolling but transitioned to sitting well without assist. Patient Response:  Cooperative  Transfers Overall transfer level: Needs assistance Equipment used: Rolling walker (2 wheeled) Transfers: Sit to/from Stand Sit to Stand: Supervision         General transfer comment: Close guard for safety as pt powered up to full standing position. VC's for hand placement on seated surface.    Balance Overall balance assessment: Needs assistance Sitting-balance support: Feet supported;Bilateral upper extremity supported Sitting balance-Leahy Scale: Fair     Standing balance support: Bilateral upper extremity supported Standing balance-Leahy Scale: Poor Standing balance comment: relies on external support currently                           ADL either performed or assessed with clinical judgement   ADL       Grooming: Wash/dry hands;Wash/dry face;Standing;Supervision/safety   Upper Body Bathing: Supervision/ safety;Sitting   Lower Body Bathing: Moderate assistance;Sit to/from stand   Upper Body Dressing : Minimal assistance;Sitting   Lower Body Dressing: Moderate assistance;Sit to/from stand   Toilet Transfer: Supervision/safety;RW   Toileting- Water quality scientist and Hygiene: Supervision/safety;Sitting/lateral lean       Functional mobility during ADLs: Supervision/safety;Rolling walker                         Cognition Arousal/Alertness: Awake/alert Behavior During Therapy: WFL for tasks assessed/performed Overall Cognitive Status: Within Functional Limits for tasks assessed  Pertinent Vitals/ Pain       Pain Assessment: 0-10 Pain Score: 5  Faces Pain Scale: Hurts whole lot Pain Location: back Pain Descriptors / Indicators: Aching;Constant;Grimacing;Restless;Operative site guarding Pain Intervention(s): Monitored during session                                                          Frequency            Progress Toward Goals  OT Goals(current goals can now be found in the care plan section)     Acute Rehab OT Goals Patient Stated Goal: reduce this pain OT Goal Formulation: With patient Time For Goal Achievement: 10/11/20 Potential to Achieve Goals: Good  Plan      Co-evaluation                 AM-PAC OT "6 Clicks" Daily Activity     Outcome Measure   Help from another person eating meals?: None Help from another person taking care of personal grooming?: None Help from another person toileting, which includes using toliet, bedpan, or urinal?: A Little Help from another person bathing (including washing, rinsing, drying)?: A Lot Help from another person to put on and taking off regular upper body clothing?: A Little Help from another person to put on and taking off regular lower body clothing?: A Lot 6 Click Score: 18    End of Session Equipment Utilized During Treatment: Rolling walker  OT Visit Diagnosis: Unsteadiness on feet (R26.81);Pain   Activity Tolerance Patient limited by pain   Patient Left in bed;with call bell/phone within reach   Nurse Communication          Time: 9326-7124 OT Time Calculation (min): 14 min  Charges: OT General Charges $OT Visit: 1 Visit OT Treatments $Self Care/Home Management : 8-22 mins  09/28/2020  Rich, OTR/L  Acute Rehabilitation Services  Office:  [place] 09/28/2020, 9:53 AM

## 2020-09-28 NOTE — Anesthesia Postprocedure Evaluation (Signed)
Anesthesia Post Note  Patient: Andrew Espinoza  Procedure(s) Performed: Thoracic eleven Corpectomy with Thoracic nine to Lumbar one arthrodesis and pedicle screws (N/A Spine Thoracic)     Patient location during evaluation: PACU Anesthesia Type: General Level of consciousness: awake and alert Pain management: pain level controlled Vital Signs Assessment: post-procedure vital signs reviewed and stable Respiratory status: spontaneous breathing, nonlabored ventilation, respiratory function stable and patient connected to nasal cannula oxygen Cardiovascular status: blood pressure returned to baseline and stable Postop Assessment: no apparent nausea or vomiting Anesthetic complications: no   No complications documented.  Last Vitals:  Vitals:   09/28/20 0527 09/28/20 0746  BP: (!) 149/93 114/66  Pulse: 95 (!) 107  Resp: 20 18  Temp: (!) 36.4 C 36.7 C  SpO2: 94% 99%    Last Pain:  Vitals:   09/28/20 0746  TempSrc: Oral  PainSc:                  [name]

## 2020-09-28 NOTE — Discharge Instructions (Addendum)
Wound Care Leave incision open to air. You may shower. Do not scrub directly on incision.  Do not put any creams, lotions, or ointments on incision. Activity Walk each and every day, increasing distance each day. No lifting greater than 5 lbs.  Avoid bending, arching, and twisting. No driving for 2 weeks; may ride as a passenger locally. If provided with back brace, wear when out of bed.  It is not necessary to wear in bed. Diet Resume your normal diet.   Call Your Doctor If Any of These Occur Redness, drainage, or swelling at the wound.  Temperature greater than 101 degrees. Severe pain not relieved by pain medication. Incision starts to come apart. Follow Up Appt Call [phone]) for problems.            Spinal Fusion Care After Refer to this sheet in the next few weeks. These instructions provide you with information on caring for yourself after your procedure. Your caregiver may also give you more specific instructions. Your treatment has been planned according to current medical practices, but problems sometimes occur. Call your caregiver if you have any problems or questions after your procedure. HOME CARE INSTRUCTIONS   Take whatever pain medicine has been prescribed by your caregiver. Do not take over-the-counter pain medicine unless directed otherwise by your caregiver.   Do not drive if you are taking narcotic pain medicines.   Change your bandage (dressing) if necessary or as directed by your caregiver.   You may shower. The wound may get wet, simply pat the area dry. It will take ~2 weeks for the glue to peel off.  If you have been prescribed medicine to prevent your blood from clotting, follow the directions carefully.   Check the area around your incision often. Look for redness and swelling. Also, look for anything leaking from your wound. You can use a mirror or have a family member inspect your incision if it is in a place where it is difficult for you to  see.   Ask your caregiver what activities you should avoid and for how long.   Walk as much as possible.   Do not lift anything heavier than 5 lbs until your caregiver says it is safe.   Do not twist or bend for a few weeks. Try not to pull on things. Avoid sitting for long periods of time. Change positions at least every hour.

## 2020-09-29 LAB — TYPE AND SCREEN
ABO/RH(D): O POS
Antibody Screen: NEGATIVE
Unit division: 0
Unit division: 0

## 2020-09-29 LAB — BPAM RBC
Blood Product Expiration Date: 202206032359
Blood Product Expiration Date: 202206042359
ISSUE DATE / TIME: 202205102326
ISSUE DATE / TIME: 202205102326
Unit Type and Rh: 5100
Unit Type and Rh: 5100

## 2020-10-01 ENCOUNTER — Encounter (HOSPITAL_COMMUNITY): Payer: Self-pay | Admitting: Neurosurgery

## 2020-10-03 ENCOUNTER — Telehealth: Payer: Self-pay | Admitting: Hematology and Oncology

## 2020-10-03 NOTE — Telephone Encounter (Signed)
I received a staff msg from Andrew Espinoza, neuro navigator to schedule a medical oncology appt. Andrew Espinoza was scheduled to see Dr.Dorsey, but when I cld, he declined to come in until he's well enough. I asked that the pt call me when he's ready to schedule an appt. I updated Andrew Espinoza.

## 2020-10-04 DIAGNOSIS — G4733 Obstructive sleep apnea (adult) (pediatric): Secondary | ICD-10-CM | POA: Diagnosis not present

## 2020-10-08 ENCOUNTER — Ambulatory Visit: Payer: BC Managed Care – PPO | Admitting: Internal Medicine

## 2020-10-08 NOTE — Progress Notes (Signed)
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  Lying on his left side relieves the pain.  He is unable to walk or sit for long periods of time.  He notes the pain since August 09, 2020, it has gotten worse over the last couple of weeks.   Pathology: T11 tumor resection 09/26/2020     Past/Anticipated chemotherapy by Neurosurgery, if any: Dr. Christella Noa 09/26/2020 -Andrew Espinoza on MRI has an infiltrative lesion that is extended into the epidural space around the spinal cord, causing some compression.   -It does not appear to be in the adjacent segment or the like.  There is an abnormal kyphotic deformity at that level secondary to the mass and a collapse of the T11 vertebral body.  Cord signal is normal throughout. -I believe that Andrew Espinoza will need resection of this mass.  I believe this can be done via posterior approach.  - I would plan on trying to do an interbody fixation and pedicle screw placement two above and two below.  - I will also order MRIs of the cervical and lumbar regions, so that we can actually see that there is nothing except this isolated mass. -Thoracic eleven Corpectomy with thoracic nine to Lumbar one arthrodesis and pedicle screws 09/26/2020 -Follow -up 10/18/2020   Past/Anticipated chemotherapy by medical oncology, if any:  -No appointment set up at this time.  Current smoker, 1 ppd  Pain on a scale of 0-10 is:  5/10 in back.    Weight: Lost about 30 pounds since initial symptoms in March 2022.  If Spine Met(s), symptoms, if any, include:  Bowel/Bladder retention or incontinence (please describe): None  Numbness or weakness in extremities (please describe): None  Ambulatory status? Walker? Wheelchair?: Walker  SAFETY ISSUES:  Prior radiation? No  Pacemaker/ICD? No  Possible current pregnancy? n/a  Is the patient on methotrexate? no  Current  Complaints / other details:

## 2020-10-09 ENCOUNTER — Encounter: Payer: Self-pay | Admitting: Radiation Oncology

## 2020-10-09 ENCOUNTER — Ambulatory Visit
Admission: RE | Admit: 2020-10-09 | Discharge: 2020-10-09 | Disposition: A | Discharge status: Home / Self Care | Payer: BC Managed Care – PPO | Source: Ambulatory Visit | Attending: Radiation Oncology | Admitting: Radiation Oncology

## 2020-10-09 ENCOUNTER — Other Ambulatory Visit: Payer: Self-pay

## 2020-10-09 VITALS — Ht 70.0 in | Wt 270.0 lb

## 2020-10-09 DIAGNOSIS — C9030 Solitary plasmacytoma not having achieved remission: Secondary | ICD-10-CM | POA: Diagnosis not present

## 2020-10-09 HISTORY — DX: Irritable bowel syndrome, unspecified: K58.9

## 2020-10-09 NOTE — Progress Notes (Signed)
Radiation Oncology         [phone] ________________________________  Initial Outpatient Consultation - Conducted via telephone due to current COVID-19 concerns for limiting patient exposure  I spoke with the patient to conduct this consult visit via telephone to spare the patient unnecessary potential exposure in the healthcare setting during the current COVID-19 pandemic. The patient was notified in advance and was offered a [place] meeting to allow for face to face communication but unfortunately reported that they did not have the appropriate resources/technology to support such a visit and instead preferred to proceed with a telephone consult.   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   HISTORY OF PRESENT ILLNESS: Sevin Langenbach is a 65 y.o. male seen at the request of Dr. Christella Noa for a diagnosis of plasmacytoma. The patient had started with pain in his low mid back on 08/09/20. He was evaluated and it was felt he may have kidney stones. He proceeded with an evaluation with Dr. Matilde Sprang in Urology at [facility] and an MRI T spine on 09/10/20 showed concerns for disease at T11 with epidural tumor resulting in severe spinal stenosis and faint cord edema. He was referred to Neurosurgery in [place] and saw Dr. Christella Noa. He had MRI of the cervical and lumbar spine on 09/19/20 that did not show disease, but degenerative disease. He underwent corpectomy and T9-L1 arthrodesis and screw placement on 09/26/20. Final pathology revealed a plasmacytoma. He is contacted today to discuss postoperative radiotherapy.     PREVIOUS RADIATION THERAPY: No   PAST MEDICAL HISTORY:  Past Medical History:  Diagnosis Date  . Anxiety   . Arthritis   . Asthma   . Depression   . Esophageal reflux   .  History of hiatal hernia    >30 year  . History of kidney stones    1996 required sx  . Hypercholesteremia    pure  . Irritable bowel syndrome (IBS)   . OSA (obstructive sleep apnea)    DIAGNOSED 2001,retested w/ AHI OF 58  . Plasmacytoma ([place]) 07/2020       PAST SURGICAL HISTORY: Past Surgical History:  Procedure Laterality Date  . ANKLE SURGERY  1998   broken fibula, remaining bone screws  . [place]  . lumbiical hernia  05/21/2012  . POSTERIOR LUMBAR FUSION 4 LEVEL N/A 09/26/2020   Procedure: Thoracic eleven Corpectomy with Thoracic nine to Lumbar one arthrodesis and pedicle screws;  Surgeon: Ashok Pall, MD;  Location: [facility];  Service: Neurosurgery;  Laterality: N/A;     FAMILY HISTORY:  Family History  Problem Relation Age of Onset  . Diabetes Mother   . Diabetes Brother      SOCIAL HISTORY:  reports that he has been smoking. He has never used smokeless tobacco. He reports that he does not drink alcohol and does not use drugs.   ALLERGIES: Elemental sulfur, Penicillins, Seasonal ic [cholestatin], and Clarithromycin   MEDICATIONS:  Current Outpatient Medications  Medication Sig Dispense Refill  . albuterol (VENTOLIN HFA) 108 (90 Base) MCG/ACT inhaler Inhale 1-2 puffs into the lungs every 6 (six) hours as needed for wheezing.    Marland Kitchen aspirin 81 MG EC tablet Take 81 mg by mouth daily.    Marland Kitchen  BREO ELLIPTA 100-25 MCG/INH AEPB Inhale 1 puff into the lungs daily.    Marland Kitchen buPROPion (WELLBUTRIN XL) 150 MG 24 hr tablet Take 150 mg by mouth daily.    Marland Kitchen dicyclomine (BENTYL) 10 MG capsule Take 10 mg by mouth 3 (three) times daily as needed for spasms.    . Loperamide HCl 1 MG/7.5ML LIQD Take 2 mg by mouth daily as needed (diarrhea or loose stools).    . Magnesium 100 MG TABS Take 100 mg by mouth in the morning and at bedtime.    Marland Kitchen omeprazole (PRILOSEC OTC) 20 MG tablet Take 20 mg by mouth daily.    . pravastatin (PRAVACHOL) 20 MG tablet Take 20 mg by mouth daily.     . tamsulosin (FLOMAX) 0.4 MG CAPS capsule Take 0.8 mg by mouth daily.    Marland Kitchen tiZANidine (ZANAFLEX) 4 MG tablet Take 1 tablet (4 mg total) by mouth every 6 (six) hours as needed for muscle spasms. 60 tablet 0  . venlafaxine XR (EFFEXOR-XR) 150 MG 24 hr capsule Take 150 mg by mouth daily.     No current facility-administered medications for this encounter.     REVIEW OF SYSTEMS: On review of systems, the patient reports that he is now walking with the aid of a walker. Prior to his surgery he couldn't walk or stand on his own. He did not have any difficulty with loss of sensation of his legs. He did not have any loss of control of his bowel or bladder. He is still having pain in the back but this is much improved from prior to surgery. He is constipated and working through this with over the counter cathartics. No other complaints are verbalized.      PHYSICAL EXAM:  Wt Readings from Last 3 Encounters:  10/09/20 270 lb (122.5 kg)  09/26/20 270 lb (122.5 kg)  09/24/20 270 lb (122.5 kg)   Temp Readings from Last 3 Encounters:  09/28/20 98.4 F (36.9 C) (Oral)  09/24/20 98.4 F (36.9 C) (Oral)  09/27/12 98.9 F (37.2 C) (Oral)   BP Readings from Last 3 Encounters:  09/28/20 (!) 141/87  09/24/20 (!) 113/91  09/27/12 138/88   Pulse Readings from Last 3 Encounters:  09/28/20 (!) 105  09/24/20 (!) 123  09/27/12 68   Pain Assessment Pain Score: 5  Pain Loc: Back/10 Unable to assess due to encounter type.   ECOG = 0  0 - Asymptomatic (Fully active, able to carry on all predisease activities without restriction)  1 - Symptomatic but completely ambulatory (Restricted in physically strenuous activity but ambulatory and able to carry out work of a light or sedentary nature. For example, light housework, office work)  2 - Symptomatic, <50% in bed during the day (Ambulatory and capable of all self care but unable to carry out any work activities. Up and about more than 50% of waking  hours)  3 - Symptomatic, >50% in bed, but not bedbound (Capable of only limited self-care, confined to bed or chair 50% or more of waking hours)  4 - Bedbound (Completely disabled. Cannot carry on any self-care. Totally confined to bed or chair)  5 - Death   Eustace Pen MM, Creech RH, Tormey DC, et al. [phone]). "Toxicity and response criteria of the [facility] Group". [place] Oncol. 5 (6): 649-55    LABORATORY DATA:  Lab Results  Component Value Date   WBC 15.9 (H) 09/26/2020   HGB 10.8 (L) 09/26/2020   HCT 33.6 (  L) 09/26/2020   MCV 92.3 09/26/2020   PLT 186 09/26/2020   Lab Results  Component Value Date   NA 137 09/26/2020   K 3.5 09/26/2020   CL 104 09/24/2020   CO2 22 09/24/2020   No results found for: ALT, AST, GGT, ALKPHOS, BILITOT    RADIOGRAPHY: DG THORACOLUMABAR SPINE  Result Date: 09/26/2020 CLINICAL DATA:  Intraop. T11 corpectomy with T9-L1 arthrodesis and pedicle screws. EXAM: THORACOLUMBAR SPINE 1V COMPARISON:  Prior MRIs. FINDINGS: Eight fluoroscopic spot views of the thoracolumbar spine obtained in the operating room. Corpectomy at T11 with pedicle screws T9 through L1. Total fluoroscopy time 3 minutes. Total dose 111.99 mGy. IMPRESSION: Intraoperative fluoroscopy during T11 corpectomy and T9 through L1 fusion. Electronically Signed   By: Keith Rake M.D.   On: 09/26/2020 15:19   MR CERVICAL SPINE W WO CONTRAST  Result Date: 09/20/2020 CLINICAL DATA:  Solitary plasmacytoma not having achieved remission. EXAM: MRI CERVICAL AND LUMBAR SPINE WITHOUT AND WITH CONTRAST TECHNIQUE: Multiplanar and multiecho pulse sequences of the cervical spine, to include the craniocervical junction and cervicothoracic junction, and lumbar spine, were obtained without and with intravenous contrast. CONTRAST:  63mL GADAVIST GADOBUTROL 1 MMOL/ML IV SOLN COMPARISON:  MRI of the thoracic spine September 10, 2020 FINDINGS: MRI CERVICAL SPINE FINDINGS Alignment: Straightening of  the cervical curvature with mild reversal at the C5-6 level. Small anterolisthesis of C7 over T1. Vertebrae: No fracture, evidence of discitis, or bone lesion. No focus of abnormal contrast enhancement. Cord: Flattening of the anterior cord contour from C3-4 through C6-7. Posterior Fossa, vertebral arteries, paraspinal tissues: T2 hyperintensity within the pons, most likely related to chronic microangiopathy. Disc levels: C2-3: Posterior disc protrusion resulting in mild to moderate spinal canal stenosis. No significant neural foraminal stenosis. C3-4: Posterior disc protrusion resulting in moderate spinal canal stenosis. Uncovertebral and facet degenerative changes resulting severe bilateral neural foraminal narrowing. C4-5: Posterior disc protrusion resulting in mild to moderate spinal canal stenosis. Uncovertebral and facet degenerative changes resulting in mild-to-moderate bilateral neural foraminal narrowing. C5-6: Posterior disc protrusion resulting in mild spinal canal stenosis. Uncovertebral and facet degenerative changes resulting in moderate right and severe left neural foraminal narrowing. C6-7: Posterior disc protrusion resulting in mild spinal canal stenosis. Uncovertebral and facet degenerative changes resulting in moderate bilateral neural foraminal. C7-T1: Small posterior disc protrusion without significant spinal canal stenosis. Facet degenerative changes resulting in mild bilateral neural foraminal narrowing. MRI LUMBAR SPINE FINDINGS Segmentation:  Standard. Alignment:  Physiologic. Vertebrae: Again seen is large destructive lesion of the T11 vertebral body and extending to the posterior elements with retropulsion resulting in spinal canal stenosis and compression of the cord at this level. This is only evaluated on sagittal images and appear stable when compared to recent dedicated MRI of the thoracic spine. Small enhancing lesions are seen at T12 and L1, also stable compared to prior MRI. No  other suspicious enhancing lesion identified. Conus medullaris and cauda equina: Conus extends to the L2 level. Conus and cauda equina appear normal. Lower thoracic cord compression, as described above. Paraspinal and other soft tissues: Left renal cyst. Disc levels: T12-L1: No spinal canal or neural foraminal stenosis. L1-2: Mild facet degenerative changes. No spinal canal or neural foraminal stenosis. L2-3: Mild facet degenerative changes. No spinal canal or neural foraminal stenosis. L3-4: Shallow disc bulge and moderate facet degenerative changes. No spinal canal or neural foraminal stenosis. L4-5: Shallow disc bulge and advanced hypertrophic facet degenerative changes, right greater than left without significant spinal canal  or neural foraminal stenosis. L5-S1: Shallow disc bulge and moderate facet degenerative changes without significant spinal canal or neural foraminal stenosis. IMPRESSION: 1. No significant interval change of the destructive lesion involving the T11 vertebra which is only partially assessed in the current study. Small enhancing lesions at T12 and L1 also appear unchanged. No additional enhancing lesion identified in the cervical or lumbar spine. 2. Degenerative changes of the cervical spine with moderate spinal canal stenosis at C3-4 with mild mass effect on the cord from C2-3 through C5-6 without cord signal abnormality. 3. Multilevel high-grade neural foraminal narrowing the cervical spine, severe bilaterally at C3-4 and on the left at C5-6. 4. Degenerative changes of the lumbar spine, mostly at the facet joints, more pronounced at L4-5. No significant spinal canal or neural foraminal stenosis of the lumbar spine. Electronically Signed   By: Pedro Earls M.D.   On: 09/20/2020 15:19   MR THORACIC SPINE W WO CONTRAST  Result Date: 09/10/2020 CLINICAL DATA:  Abnormal CT demonstrating a destructive T11 lesion. EXAM: MRI THORACIC WITHOUT AND WITH CONTRAST TECHNIQUE:  Multiplanar and multiecho pulse sequences of the thoracic spine were obtained without and with intravenous contrast. CONTRAST:  40mL GADAVIST GADOBUTROL 1 MMOL/ML IV SOLN COMPARISON:  CT abdomen and pelvis 09/07/2020 FINDINGS: Alignment:  Normal. Vertebrae: As seen on the recent abdominal CT, there is a large destructive lesion involving the T11 vertebral body and posterior elements bilaterally with pathologic vertebral body fracture demonstrating mild height loss. The lesion demonstrates largely uniform T2 hyperintensity and avid enhancement although there are some vertical low signal intensity striations anteriorly. There is prominent epidural tumor ventrally and laterally in the spinal canal resulting in severe spinal stenosis and mild spinal cord compression. Smaller enhancing lesions are present in the T3, T10, T12, and L1 vertebral bodies and in the right-sided T8 posterior elements. Cord: Questionable faint T2 hyperintensity in the spinal cord at T11. Normal cord signal elsewhere. Paraspinal and other soft tissues: Unremarkable. Disc levels: Limited visualization of the cervical spine on sagittal imaging demonstrates disc bulging at C6-7 resulting in mild spinal stenosis and suspected moderate bilateral neural foraminal stenosis. A small central disc protrusion at T3-4 indents the ventral spinal cord without significant spinal stenosis. Shallow central disc protrusions at T6-7, T7-8, and T8-9 do not result in significant spinal cord mass effect or stenosis. T11 tumor encroaches upon the T11-12 greater than T10-11 neural foramina bilaterally. IMPRESSION: 1. 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 2. Additional smaller enhancing lesions in the thoracic and upper lumbar spine. 3. Given the appearance of the dominant T11 mass, multiple myeloma is favored over  metastatic disease. Electronically Signed   By: Logan Bores M.D.   On: 09/10/2020 14:23   MR Lumbar Spine W Wo Contrast  Result Date: 09/20/2020 CLINICAL DATA:  Solitary plasmacytoma not having achieved remission. EXAM: MRI CERVICAL AND LUMBAR SPINE WITHOUT AND WITH CONTRAST TECHNIQUE: Multiplanar and multiecho pulse sequences of the cervical spine, to include the craniocervical junction and cervicothoracic junction, and lumbar spine, were obtained without and with intravenous contrast. CONTRAST:  40mL GADAVIST GADOBUTROL 1 MMOL/ML IV SOLN COMPARISON:  MRI of the thoracic spine September 10, 2020 FINDINGS: MRI CERVICAL SPINE FINDINGS Alignment: Straightening of the cervical curvature with mild reversal at the C5-6 level. Small anterolisthesis of C7 over T1. Vertebrae: No fracture, evidence of discitis, or bone lesion.  No focus of abnormal contrast enhancement. Cord: Flattening of the anterior cord contour from C3-4 through C6-7. Posterior Fossa, vertebral arteries, paraspinal tissues: T2 hyperintensity within the pons, most likely related to chronic microangiopathy. Disc levels: C2-3: Posterior disc protrusion resulting in mild to moderate spinal canal stenosis. No significant neural foraminal stenosis. C3-4: Posterior disc protrusion resulting in moderate spinal canal stenosis. Uncovertebral and facet degenerative changes resulting severe bilateral neural foraminal narrowing. C4-5: Posterior disc protrusion resulting in mild to moderate spinal canal stenosis. Uncovertebral and facet degenerative changes resulting in mild-to-moderate bilateral neural foraminal narrowing. C5-6: Posterior disc protrusion resulting in mild spinal canal stenosis. Uncovertebral and facet degenerative changes resulting in moderate right and severe left neural foraminal narrowing. C6-7: Posterior disc protrusion resulting in mild spinal canal stenosis. Uncovertebral and facet degenerative changes resulting in moderate bilateral neural  foraminal. C7-T1: Small posterior disc protrusion without significant spinal canal stenosis. Facet degenerative changes resulting in mild bilateral neural foraminal narrowing. MRI LUMBAR SPINE FINDINGS Segmentation:  Standard. Alignment:  Physiologic. Vertebrae: Again seen is large destructive lesion of the T11 vertebral body and extending to the posterior elements with retropulsion resulting in spinal canal stenosis and compression of the cord at this level. This is only evaluated on sagittal images and appear stable when compared to recent dedicated MRI of the thoracic spine. Small enhancing lesions are seen at T12 and L1, also stable compared to prior MRI. No other suspicious enhancing lesion identified. Conus medullaris and cauda equina: Conus extends to the L2 level. Conus and cauda equina appear normal. Lower thoracic cord compression, as described above. Paraspinal and other soft tissues: Left renal cyst. Disc levels: T12-L1: No spinal canal or neural foraminal stenosis. L1-2: Mild facet degenerative changes. No spinal canal or neural foraminal stenosis. L2-3: Mild facet degenerative changes. No spinal canal or neural foraminal stenosis. L3-4: Shallow disc bulge and moderate facet degenerative changes. No spinal canal or neural foraminal stenosis. L4-5: Shallow disc bulge and advanced hypertrophic facet degenerative changes, right greater than left without significant spinal canal or neural foraminal stenosis. L5-S1: Shallow disc bulge and moderate facet degenerative changes without significant spinal canal or neural foraminal stenosis. IMPRESSION: 1. No significant interval change of the destructive lesion involving the T11 vertebra which is only partially assessed in the current study. Small enhancing lesions at T12 and L1 also appear unchanged. No additional enhancing lesion identified in the cervical or lumbar spine. 2. Degenerative changes of the cervical spine with moderate spinal canal stenosis at C3-4  with mild mass effect on the cord from C2-3 through C5-6 without cord signal abnormality. 3. Multilevel high-grade neural foraminal narrowing the cervical spine, severe bilaterally at C3-4 and on the left at C5-6. 4. Degenerative changes of the lumbar spine, mostly at the facet joints, more pronounced at L4-5. No significant spinal canal or neural foraminal stenosis of the lumbar spine. Electronically Signed   By: Pedro Earls M.D.   On: 09/20/2020 15:19   DG C-Arm 1-60 Min  Result Date: 09/26/2020 CLINICAL DATA:  Intraop. T11 corpectomy with T9-L1 arthrodesis and pedicle screws. EXAM: THORACOLUMBAR SPINE 1V COMPARISON:  Prior MRIs. FINDINGS: Eight fluoroscopic spot views of the thoracolumbar spine obtained in the operating room. Corpectomy at T11 with pedicle screws T9 through L1. Total fluoroscopy time 3 minutes. Total dose 111.99 mGy. IMPRESSION: Intraoperative fluoroscopy during T11 corpectomy and T9 through L1 fusion. Electronically Signed   By: Keith Rake M.D.   On: 09/26/2020 15:19  IMPRESSION/PLAN: 1. Plasmacytoma versus Multiple Myeloma. Dr. Lisbeth Renshaw has reviewed the imaging and pathology findings regarding this patient and the rationale to continue his work up to rule out multiple myeloma. Even though the diagnosis only includes plasmacytoma, Dr. Lisbeth Renshaw would offer external radiotherapy to the spine to reduce risks of tumor regrowing and leading to cord compression. We discussed the risks, benefits, short, and long term effects of radiotherapy, as well as the palliative intent, and the patient is interested in proceeding. The patient also needs a referral to medical oncology to continue working up his diagnosis. Dr. Lisbeth Renshaw would offer 2 weeks of radiotherapy to the thoracolumbar spine, however the patient is interested in treatment closer to home. His wife was a patient of Dr. Bobby Rumpf' and he would like to be seen closer to home for medical oncology and radiation oncology  care. We will make urgent referrals for this and the patient will follow up for postoperative care with Dr. Christella Noa as well.    Given current concerns for patient exposure during the COVID-19 pandemic, this encounter was conducted via telephone.  The patient has provided two factor identification and has given verbal consent for this type of encounter and has been advised to only accept a meeting of this type in a secure network environment. The time spent during this encounter was 60 minutes including preparation, discussion, and coordination of the patient's care. The attendants for this meeting include Blenda Nicely, RN,  Hayden Pedro  and [name].  During the encounter,  Blenda Nicely, RN, and Hayden Pedro were located at [facility] Radiation Oncology Department.  Jeronimo Hellberg was located at home.     Carola Rhine, [facility]   **Disclaimer: This note was dictated with voice recognition software. Similar sounding words can inadvertently be transcribed and this note may contain transcription errors which may not have been corrected upon publication of note.**

## 2020-10-10 ENCOUNTER — Other Ambulatory Visit: Payer: BC Managed Care – PPO

## 2020-10-10 ENCOUNTER — Ambulatory Visit: Payer: BC Managed Care – PPO | Admitting: Hematology and Oncology

## 2020-10-11 ENCOUNTER — Telehealth: Payer: Self-pay | Admitting: Oncology

## 2020-10-11 NOTE — Telephone Encounter (Signed)
Patient referred by Bryson Ha Perkins/Dr Kyung Rudd for Solitary Plasmacytoma - w/Possible Bone Myeloma.  Appt made 10/18/20 Nurse 1:15 pm - Dr Orlene Erm 1:45 pm - Dr Bobby Rumpf 2:30 pm

## 2020-10-11 NOTE — Telephone Encounter (Signed)
Patient referred by Shona Simpson, PA-C for Solitary Plasmacytoma - w/Possible Bone Myeloma.  Appt made 10/24/20 Nurse 9:15 am - Dr Orlene Erm 9:45 am - Labs 10:45 am - Dr. Bobby Rumpf 11:15 am

## 2020-10-12 DIAGNOSIS — G4733 Obstructive sleep apnea (adult) (pediatric): Secondary | ICD-10-CM | POA: Diagnosis not present

## 2020-10-17 ENCOUNTER — Encounter (HOSPITAL_COMMUNITY): Payer: Self-pay | Admitting: Neurosurgery

## 2020-10-18 ENCOUNTER — Ambulatory Visit: Payer: BC Managed Care – PPO | Admitting: Oncology

## 2020-10-18 DIAGNOSIS — M5417 Radiculopathy, lumbosacral region: Secondary | ICD-10-CM | POA: Diagnosis not present

## 2020-10-22 ENCOUNTER — Telehealth: Payer: Self-pay | Admitting: *Deleted

## 2020-10-22 NOTE — Telephone Encounter (Signed)
RETURNED PATIENT'S PHONE CALL, SPOKE WITH PATIENT. ?

## 2020-10-23 ENCOUNTER — Telehealth: Payer: Self-pay | Admitting: Oncology

## 2020-10-24 ENCOUNTER — Other Ambulatory Visit: Payer: Self-pay

## 2020-10-24 ENCOUNTER — Encounter: Payer: Self-pay | Admitting: Oncology

## 2020-10-24 ENCOUNTER — Inpatient Hospital Stay: Payer: BC Managed Care – PPO | Attending: Oncology | Admitting: Oncology

## 2020-10-24 ENCOUNTER — Other Ambulatory Visit: Payer: Self-pay | Admitting: Oncology

## 2020-10-24 ENCOUNTER — Telehealth: Payer: Self-pay | Admitting: Oncology

## 2020-10-24 ENCOUNTER — Inpatient Hospital Stay: Payer: BC Managed Care – PPO

## 2020-10-24 VITALS — BP 133/73 | HR 109 | Temp 98.0°F | Resp 20 | Ht 70.0 in | Wt 244.9 lb

## 2020-10-24 DIAGNOSIS — C9000 Multiple myeloma not having achieved remission: Secondary | ICD-10-CM | POA: Diagnosis not present

## 2020-10-24 DIAGNOSIS — C9030 Solitary plasmacytoma not having achieved remission: Secondary | ICD-10-CM

## 2020-10-24 LAB — BASIC METABOLIC PANEL
BUN: 9 (ref 4–21)
CO2: 28 — AB (ref 13–22)
Chloride: 101 (ref 99–108)
Creatinine: 1 (ref 0.6–1.3)
Glucose: 138
Potassium: 3.1 — AB (ref 3.4–5.3)
Sodium: 136 — AB (ref 137–147)

## 2020-10-24 LAB — HEPATIC FUNCTION PANEL
ALT: 20 (ref 10–40)
AST: 45 — AB (ref 14–40)
Alkaline Phosphatase: 137 — AB (ref 25–125)
Bilirubin, Total: 0.4

## 2020-10-24 LAB — CBC AND DIFFERENTIAL
HCT: 38 — AB (ref 41–53)
Hemoglobin: 12.1 — AB (ref 13.5–17.5)
Neutrophils Absolute: 7.13
Platelets: 256 (ref 150–399)
WBC: 9.9

## 2020-10-24 LAB — COMPREHENSIVE METABOLIC PANEL
Albumin: 4.1 (ref 3.5–5.0)
Calcium: 9.2 (ref 8.7–10.7)

## 2020-10-24 LAB — CBC: RBC: 4.06 (ref 3.87–5.11)

## 2020-10-24 NOTE — Telephone Encounter (Signed)
Per 6/8 los next appt scheduled and given to patient 

## 2020-10-24 NOTE — Progress Notes (Signed)
[facility]  [address] [place],  [place]  [address] [phone]  Clinic Day:  10/24/2020  Referring physician: Hayden Pedro,*   HISTORY OF PRESENT ILLNESS:  The patient is a 65 y.o. male  who I was asked to consult upon for the development of a solitary plasmacytoma.  His history dates back to late March 2022 when he woke up one morning with back pain.  He denied having any trauma to his back around that time.  Over a span of 2 weeks, his back pain intensified to where he could barely walk.  He was evaluated by urology as the concern was his back pain was due to kidney stones, which he has had before.  He underwent a CT scan, which unexpectedly showed a T11 lesion.  This led to a thoracic MRI being done, which showed this lesion causing severe spinal stenosis, mild cord compression and cord edema.  Smaller enhancing lesions were seen in his T3, T10, T12 and L1 vertebral bodies.  A T11 corpectomy/spinal fusion was done in May 2022, whose pathology revealed a plasmacytoma.  He is currently being evaluated by radiation oncology to discuss radiation to this area.  He comes in today to determine if he has more systemic disease.  He claims he is back to walking with minimal assistance.  He denies having bone pain elsewhere or other systemic symptoms which concern him for a more diffuse process being present.   PAST MEDICAL HISTORY:   Past Medical History:  Diagnosis Date  . Anxiety   . Arthritis   . Asthma   . Depression   . Esophageal reflux   . History of hiatal hernia    >30 year  . History of kidney stones    1996 required sx  . Hypercholesteremia    pure  . Irritable bowel syndrome (IBS)   . OSA (obstructive sleep apnea)    DIAGNOSED 2001,retested w/ AHI OF 58  . Plasmacytoma ([facility]) 07/2020    PAST SURGICAL HISTORY:   Past Surgical History:  Procedure Laterality Date  . ANKLE SURGERY  1998   broken fibula, remaining bone screws  .  [facility]  . lumbiical hernia  05/21/2012  . POSTERIOR LUMBAR FUSION 4 LEVEL N/A 09/26/2020   Procedure: Thoracic eleven Corpectomy with Thoracic nine to Lumbar one arthrodesis and pedicle screws;  Surgeon: Ashok Pall, MD;  Location: [facility];  Service: Neurosurgery;  Laterality: N/A;    CURRENT MEDICATIONS:   Current Outpatient Medications  Medication Sig Dispense Refill  . albuterol (VENTOLIN HFA) 108 (90 Base) MCG/ACT inhaler Inhale 1-2 puffs into the lungs every 6 (six) hours as needed for wheezing.    Marland Kitchen aspirin 81 MG EC tablet Take 81 mg by mouth daily.    Marland Kitchen BREO ELLIPTA 100-25 MCG/INH AEPB Inhale 1 puff into the lungs daily.    Marland Kitchen buPROPion (WELLBUTRIN XL) 150 MG 24 hr tablet Take 150 mg by mouth daily.    Marland Kitchen dicyclomine (BENTYL) 10 MG capsule Take 10 mg by mouth 3 (three) times daily as needed for spasms.    . Loperamide HCl 1 MG/7.5ML LIQD Take 2 mg by mouth daily as needed (diarrhea or loose stools).    . Magnesium 100 MG TABS Take 100 mg by mouth in the morning and at bedtime.    Marland Kitchen omeprazole (PRILOSEC OTC) 20 MG tablet Take 20 mg by mouth daily.    . pravastatin (PRAVACHOL) 20 MG tablet  Take 20 mg by mouth daily.    . tamsulosin (FLOMAX) 0.4 MG CAPS capsule Take 0.8 mg by mouth daily.    Marland Kitchen tiZANidine (ZANAFLEX) 4 MG tablet Take 1 tablet (4 mg total) by mouth every 6 (six) hours as needed for muscle spasms. 60 tablet 0  . venlafaxine XR (EFFEXOR-XR) 150 MG 24 hr capsule Take 150 mg by mouth daily.     No current facility-administered medications for this visit.    ALLERGIES:   Allergies  Allergen Reactions  . Elemental Sulfur Rash  . Penicillins Rash  . Seasonal Ic [Cholestatin]     Does not recall this  . Clarithromycin     Can't remember reaction    FAMILY HISTORY:   Family History  Problem Relation Age of Onset  . Diabetes Mother   . Diabetes Brother   His mother had a history of kidney cancer.  His father died from a heart attack.  SOCIAL  HISTORY:  The patient was born and raised in [place].  He lives in [place].  He is widowed, with no children.  He was a [employer] architect for 31 years.  He has smoked a pack of cigarettes daily for 40 years.  He denies using alcohol.  REVIEW OF SYSTEMS:  Review of Systems  Constitutional: Positive for unexpected weight change. Negative for fatigue and fever.  Respiratory: Positive for cough. Negative for chest tightness, hemoptysis and shortness of breath.   Cardiovascular: Negative for chest pain and palpitations.  Gastrointestinal: Positive for diarrhea. Negative for abdominal distention, abdominal pain, blood in stool, constipation, nausea and vomiting.  Genitourinary: Negative for dysuria, frequency and hematuria.   Musculoskeletal: Positive for arthralgias. Negative for back pain and myalgias.  Skin: Negative for itching and rash.  Neurological: Positive for headaches. Negative for dizziness and light-headedness.  Psychiatric/Behavioral: Positive for depression. Negative for suicidal ideas. The patient is not nervous/anxious.      PHYSICAL EXAM:  Blood pressure 133/73, pulse (!) 109, temperature 98 F (36.7 C), resp. rate 20, height 5\' 10"  (1.778 m), weight 244 lb 14.4 oz (111.1 kg), SpO2 95 %. Wt Readings from Last 3 Encounters:  10/24/20 244 lb 14.4 oz (111.1 kg)  10/09/20 270 lb (122.5 kg)  09/26/20 270 lb (122.5 kg)   Body mass index is 35.14 kg/m. Performance status (ECOG): 1 - Symptomatic but completely ambulatory Physical Exam Constitutional:      Appearance: Normal appearance. He is not ill-appearing.     Comments: He is ambulating with a walker  HENT:     Mouth/Throat:     Mouth: Mucous membranes are moist.     Pharynx: Oropharynx is clear. No oropharyngeal exudate or posterior oropharyngeal erythema.  Cardiovascular:     Rate and Rhythm: Normal rate and regular rhythm.     Heart sounds: No murmur heard. No friction rub. No gallop.   Pulmonary:     Effort:  Pulmonary effort is normal. No respiratory distress.     Breath sounds: Normal breath sounds. No wheezing, rhonchi or rales.  Chest:  Breasts:     Right: No axillary adenopathy or supraclavicular adenopathy.     Left: No axillary adenopathy or supraclavicular adenopathy.    Abdominal:     General: Bowel sounds are normal. There is no distension.     Palpations: Abdomen is soft. There is no mass.     Tenderness: There is no abdominal tenderness.  Musculoskeletal:        General: No swelling.  Thoracic back: No swelling or edema.     Right lower leg: No edema.     Left lower leg: No edema.     Comments: No erythema or signs of infection from recent surgery  Lymphadenopathy:     Cervical: No cervical adenopathy.     Upper Body:     Right upper body: No supraclavicular or axillary adenopathy.     Left upper body: No supraclavicular or axillary adenopathy.     Lower Body: No right inguinal adenopathy. No left inguinal adenopathy.  Skin:    General: Skin is warm.     Coloration: Skin is not jaundiced.     Findings: No lesion or rash.  Neurological:     General: No focal deficit present.     Mental Status: He is alert and oriented to person, place, and time. Mental status is at baseline.     Cranial Nerves: Cranial nerves are intact.  Psychiatric:        Mood and Affect: Mood normal.        Behavior: Behavior normal.        Thought Content: Thought content normal.    LABS:   CBC Latest Ref Rng & Units 10/24/2020 09/26/2020 09/26/2020  WBC - 9.9 15.9(H) -  Hemoglobin 13.5 - 17.5 12.1(A) 10.8(L) 10.5(L)  Hematocrit 41 - 53 38(A) 33.6(L) 31.0(L)  Platelets 150 - 399 256 186 -   CMP Latest Ref Rng & Units 10/24/2020 09/26/2020 09/26/2020  Glucose 70 - 99 mg/dL - - -  BUN 4 - 21 9 - -  Creatinine 0.6 - 1.3 1.0 1.38(H) -  Sodium 137 - 147 136(A) - 137  Potassium 3.4 - 5.3 3.1(A) - 3.5  Chloride 99 - 108 101 - -  CO2 13 - 22 28(A) - -  Calcium 8.7 - 10.7 9.2 - -  Alkaline Phos 25  - 125 137(A) - -  AST 14 - 40 45(A) - -  ALT 10 - 40 20 - -   ASSESSMENT & PLAN:  A 65 y.o. male who I was asked to consult upon for a plasmacytoma, status post a T11 corpectomy in May 2022.  When evaluating his entire picture, this gentleman theoretically qualifies for already having multiple myeloma as small lesions were also seen in other vertebral bodies per his recent MRIs.  For completeness, I will check his SPEP, quantitative immunoglobulins and serum light chain levels.  He will also undergo a skeletal survey to see if lytic lesions are present.  I also believe he warrants a bone marrow biopsy to determine if significant marrow involvement of plasma cells is already present.  His bone marrow biopsy will be scheduled for Monday, June 13th.  I will see him back 2 weeks later to go over his bone marrow biopsy results, x-rays, and labs, all of which will be used to formulate his next course of action.  The patient understands all the plans discussed today and is in agreement with them.  I do appreciate Hayden Pedro,* for his new consult.   Teegan Guinther Macarthur Critchley, MD

## 2020-10-25 DIAGNOSIS — C9030 Solitary plasmacytoma not having achieved remission: Secondary | ICD-10-CM | POA: Diagnosis not present

## 2020-10-25 DIAGNOSIS — M19011 Primary osteoarthritis, right shoulder: Secondary | ICD-10-CM | POA: Diagnosis not present

## 2020-10-25 DIAGNOSIS — M47819 Spondylosis without myelopathy or radiculopathy, site unspecified: Secondary | ICD-10-CM | POA: Diagnosis not present

## 2020-10-25 LAB — PROTEIN ELECTROPHORESIS, SERUM
A/G Ratio: 1.5 (ref 0.7–1.7)
Albumin ELP: 3.8 g/dL (ref 2.9–4.4)
Alpha-1-Globulin: 0.3 g/dL (ref 0.0–0.4)
Alpha-2-Globulin: 0.7 g/dL (ref 0.4–1.0)
Beta Globulin: 1 g/dL (ref 0.7–1.3)
Gamma Globulin: 0.5 g/dL (ref 0.4–1.8)
Globulin, Total: 2.5 g/dL (ref 2.2–3.9)
M-Spike, %: 0.3 g/dL — ABNORMAL HIGH
Total Protein ELP: 6.3 g/dL (ref 6.0–8.5)

## 2020-10-25 LAB — KAPPA/LAMBDA LIGHT CHAINS
Kappa free light chain: 21.8 mg/L — ABNORMAL HIGH (ref 3.3–19.4)
Kappa, lambda light chain ratio: 0.1 — ABNORMAL LOW (ref 0.26–1.65)
Lambda free light chains: 215.9 mg/L — ABNORMAL HIGH (ref 5.7–26.3)

## 2020-10-25 LAB — IGG, IGA, IGM
IgA: 170 mg/dL (ref 61–437)
IgG (Immunoglobin G), Serum: 675 mg/dL (ref 603–1613)
IgM (Immunoglobulin M), Srm: 20 mg/dL (ref 20–172)

## 2020-10-25 LAB — BETA 2 MICROGLOBULIN, SERUM: Beta-2 Microglobulin: 2.5 mg/L — ABNORMAL HIGH (ref 0.6–2.4)

## 2020-10-26 DIAGNOSIS — Z510 Encounter for antineoplastic radiation therapy: Secondary | ICD-10-CM | POA: Diagnosis not present

## 2020-10-26 DIAGNOSIS — C9030 Solitary plasmacytoma not having achieved remission: Secondary | ICD-10-CM | POA: Diagnosis not present

## 2020-10-29 ENCOUNTER — Encounter: Payer: Self-pay | Admitting: Oncology

## 2020-10-29 ENCOUNTER — Inpatient Hospital Stay: Payer: BC Managed Care – PPO

## 2020-10-29 ENCOUNTER — Other Ambulatory Visit: Payer: Self-pay

## 2020-10-29 ENCOUNTER — Inpatient Hospital Stay (INDEPENDENT_AMBULATORY_CARE_PROVIDER_SITE_OTHER): Payer: BC Managed Care – PPO | Admitting: Oncology

## 2020-10-29 ENCOUNTER — Telehealth: Payer: Self-pay | Admitting: Oncology

## 2020-10-29 DIAGNOSIS — C9030 Solitary plasmacytoma not having achieved remission: Secondary | ICD-10-CM

## 2020-10-29 DIAGNOSIS — C9000 Multiple myeloma not having achieved remission: Secondary | ICD-10-CM | POA: Diagnosis not present

## 2020-10-29 DIAGNOSIS — D649 Anemia, unspecified: Secondary | ICD-10-CM | POA: Diagnosis not present

## 2020-10-29 LAB — CBC WITH DIFFERENTIAL/PLATELET
Abs Immature Granulocytes: 0.02 10*3/uL (ref 0.00–0.07)
Basophils Absolute: 0.1 10*3/uL (ref 0.0–0.1)
Basophils Relative: 1 %
Eosinophils Absolute: 0.2 10*3/uL (ref 0.0–0.5)
Eosinophils Relative: 2 %
HCT: 34.7 % — ABNORMAL LOW (ref 39.0–52.0)
Hemoglobin: 11.1 g/dL — ABNORMAL LOW (ref 13.0–17.0)
Immature Granulocytes: 0 %
Lymphocytes Relative: 19 %
Lymphs Abs: 1.8 10*3/uL (ref 0.7–4.0)
MCH: 30.4 pg (ref 26.0–34.0)
MCHC: 32 g/dL (ref 30.0–36.0)
MCV: 95.1 fL (ref 80.0–100.0)
Monocytes Absolute: 0.7 10*3/uL (ref 0.1–1.0)
Monocytes Relative: 7 %
Neutro Abs: 7 10*3/uL (ref 1.7–7.7)
Neutrophils Relative %: 71 %
Platelets: 218 10*3/uL (ref 150–400)
RBC: 3.65 MIL/uL — ABNORMAL LOW (ref 4.22–5.81)
RDW: 16.4 % — ABNORMAL HIGH (ref 11.5–15.5)
WBC: 9.8 10*3/uL (ref 4.0–10.5)
nRBC: 0 % (ref 0.0–0.2)

## 2020-10-29 NOTE — Addendum Note (Signed)
Addended by: Lavera Guise A on: 10/29/2020 11:21 AM   Modules accepted: Orders, SmartSet

## 2020-10-29 NOTE — Addendum Note (Signed)
Addended by: Lavera Guise A on: 10/29/2020 01:28 PM   Modules accepted: Orders, SmartSet

## 2020-10-29 NOTE — Progress Notes (Signed)
BONE MARROW PROCEDURE NOTE: Due to a plasmacytoma found on his thoracic spine and lucent calvarial lesions being seen on his skull, a bone marrow biopsy was done to verify the existence of multiple myeloma.  After consenting for the procedure, the patient's left posterior superior iliac crest was topically sterilized.  Afterwards, a bone marrow core and aspirate were collected, which will be sent for flow cytometry, cytogenetics, and a myeloma FISH panel.  There were no complications experienced with this procedure.

## 2020-10-29 NOTE — Addendum Note (Signed)
Addended by: Karie Chimera T on: 10/29/2020 11:21 AM   Modules accepted: Orders

## 2020-10-29 NOTE — Telephone Encounter (Signed)
Per 6/13 LOS, patient scheduled for 6/22 Follow Up.  Gave patient Appt Summary

## 2020-10-30 ENCOUNTER — Other Ambulatory Visit: Payer: Self-pay | Admitting: Adult Health

## 2020-10-30 ENCOUNTER — Other Ambulatory Visit: Payer: Self-pay | Admitting: Oncology

## 2020-10-30 DIAGNOSIS — C9030 Solitary plasmacytoma not having achieved remission: Secondary | ICD-10-CM

## 2020-10-31 DIAGNOSIS — C9030 Solitary plasmacytoma not having achieved remission: Secondary | ICD-10-CM | POA: Diagnosis not present

## 2020-10-31 DIAGNOSIS — Z510 Encounter for antineoplastic radiation therapy: Secondary | ICD-10-CM | POA: Diagnosis not present

## 2020-10-31 LAB — SURGICAL PATHOLOGY

## 2020-11-01 DIAGNOSIS — C9030 Solitary plasmacytoma not having achieved remission: Secondary | ICD-10-CM | POA: Diagnosis not present

## 2020-11-01 DIAGNOSIS — Z510 Encounter for antineoplastic radiation therapy: Secondary | ICD-10-CM | POA: Diagnosis not present

## 2020-11-02 DIAGNOSIS — Z510 Encounter for antineoplastic radiation therapy: Secondary | ICD-10-CM | POA: Diagnosis not present

## 2020-11-02 DIAGNOSIS — C9030 Solitary plasmacytoma not having achieved remission: Secondary | ICD-10-CM | POA: Diagnosis not present

## 2020-11-03 DIAGNOSIS — M7551 Bursitis of right shoulder: Secondary | ICD-10-CM | POA: Diagnosis not present

## 2020-11-03 DIAGNOSIS — M25511 Pain in right shoulder: Secondary | ICD-10-CM | POA: Diagnosis not present

## 2020-11-04 ENCOUNTER — Encounter (HOSPITAL_COMMUNITY): Payer: Self-pay | Admitting: Neurosurgery

## 2020-11-04 NOTE — OR Nursing (Signed)
The Implant log was corrected to show two different serial numbered 2.5 cc of bone putty were implanted.

## 2020-11-05 ENCOUNTER — Encounter (HOSPITAL_COMMUNITY): Payer: Self-pay | Admitting: Oncology

## 2020-11-05 DIAGNOSIS — Z510 Encounter for antineoplastic radiation therapy: Secondary | ICD-10-CM | POA: Diagnosis not present

## 2020-11-05 DIAGNOSIS — C9030 Solitary plasmacytoma not having achieved remission: Secondary | ICD-10-CM | POA: Diagnosis not present

## 2020-11-05 NOTE — Progress Notes (Signed)
[facility]  [address] [place],  [place]  [address] [phone]  Clinic Day:  11/07/2020  Referring physician: Angelina Sheriff, MD  This document serves as a record of services personally performed by [name], MD. It was created on their behalf by [facility], a trained medical scribe. The creation of this record is based on the scribe's personal observations and the provider's statements to them.  HISTORY OF PRESENT ILLNESS:  The patient is a 65 y.o. male  who I recently began seeing for having a solitary plasmacytoma.  However, a thoracic MRI did show enhancing lesions in his T3, T10, T12 and L1 vertebral bodies.  A T11 corpectomy/spinal fusion was done in May 2022, whose pathology revealed a plasmacytoma.  Based upon these findings, the patient underwent a bone marrow biopsy to determine if he has a more systemic disease process present, such as multiple myeloma.  He comes in today to go over his bone marrow biopsy results and their implications.  Overall, he is doing well.  He still has some back pain, but it is much better after surgery and radiation.  He is back to walking on his own power.    PHYSICAL EXAM:  Blood pressure (!) 151/79, pulse 64, temperature 98.2 F (36.8 C), resp. rate 16, height $RemoveBe'5\' 10"'JxfDwPcHy$  (1.778 m), weight 258 lb (117 kg), SpO2 96 %. Wt Readings from Last 3 Encounters:  11/07/20 258 lb (117 kg)  10/29/20 251 lb 9.6 oz (114.1 kg)  10/24/20 244 lb 14.4 oz (111.1 kg)   Body mass index is 37.02 kg/m. Performance status (ECOG): 1 - Symptomatic but completely ambulatory Physical Exam Constitutional:      Appearance: Normal appearance. He is not ill-appearing.     Comments: He is ambulating with a walker  HENT:     Mouth/Throat:     Mouth: Mucous membranes are moist.     Pharynx: Oropharynx is clear. No oropharyngeal exudate or posterior oropharyngeal erythema.  Cardiovascular:     Rate and Rhythm: Normal rate and  regular rhythm.     Heart sounds: No murmur heard.   No friction rub. No gallop.  Pulmonary:     Effort: Pulmonary effort is normal. No respiratory distress.     Breath sounds: Normal breath sounds. No wheezing, rhonchi or rales.  Chest:  Breasts:    Right: No axillary adenopathy or supraclavicular adenopathy.     Left: No axillary adenopathy or supraclavicular adenopathy.  Abdominal:     General: Bowel sounds are normal. There is no distension.     Palpations: Abdomen is soft. There is no mass.     Tenderness: There is no abdominal tenderness.  Musculoskeletal:        General: No swelling.     Thoracic back: No swelling or edema.     Right lower leg: No edema.     Left lower leg: No edema.  Lymphadenopathy:     Cervical: No cervical adenopathy.     Upper Body:     Right upper body: No supraclavicular or axillary adenopathy.     Left upper body: No supraclavicular or axillary adenopathy.     Lower Body: No right inguinal adenopathy. No left inguinal adenopathy.  Skin:    General: Skin is warm.     Coloration: Skin is not jaundiced.     Findings: No lesion or rash.  Neurological:     General: No focal deficit present.  Mental Status: He is alert and oriented to person, place, and time. Mental status is at baseline.     Cranial Nerves: Cranial nerves are intact.  Psychiatric:        Mood and Affect: Mood normal.        Behavior: Behavior normal.        Thought Content: Thought content normal.   PATHOLOGY:  His bone marrow biopsy revealed the following: DIAGNOSIS:   BONE MARROW, ASPIRATE, CLOT, CORE:  - Plasma cell myeloma.  - Minimal iron stores.   PERIPHERAL BLOOD:  - Normocytic anemia.   COMMENT:   The marrow is overall normocellular, but exhibits a monoclonal  plasmacytosis (14% aspirate, 15% CD138 immunohistochemistry). These  findings are consistent with plasma cell myeloma.    MICROSCOPIC DESCRIPTION:   PERIPHERAL BLOOD SMEAR: There is a normocytic  anemia with mild  polychromasia.  Rouleaux formation is not identified.  Leukocytes are  present in normal numbers.  Circulating plasma cells are not identified.  Platelets are present in normal numbers.   BONE MARROW ASPIRATE: Spicular and cellular.  Erythroid precursors: Present in appropriate proportions.  No  significant dysplasia.  Granulocytic precursors: Present in appropriate proportions.  No  significant dysplasia.  No increase in blasts.  Megakaryocytes: Present and morphologically unremarkable.  Lymphocytes/plasma cells: Plasma cells are increased in numbers (14% by  manual differential counts).  There are frequent large forms and  occasional binucleate forms.  Lymphocytes are not significantly  increased.   TOUCH PREPARATIONS: Similar to aspirate smears.   CLOT AND BIOPSY: The core biopsy is small with aspiration artifacts.  The clot section appears normocellular for age (30-40%).  Myeloid and  erythroid elements are present in appropriate proportions.  Megakaryocytes exhibit a spectrum of maturation without tight clusters.  There are a few small benign-appearing lymphoid aggregates (CD20 B-cells  and CD3 T-cells).  CD138 immunohistochemistry reveals increased plasma  cells (overall 15%) with small aggregates.  By light chain in situ  hybridization the plasma cells are lambda restricted.   IRON STAIN: Iron stains are performed on a bone marrow aspirate or touch  imprint smear and section of clot. The controls stained appropriately.        Storage Iron: Minimal       Ring Sideroblasts: Absent   ADDITIONAL DATA/TESTING: Cytogenetics, including FISH for myeloma, was  ordered, and will be reported in an addendum.  Flow cytometry  ([id number]) is negative for a monoclonal B-cell or phenotypically  aberrant T-cell population.   LABS:   CBC Latest Ref Rng & Units 10/29/2020 10/24/2020 09/26/2020  WBC 4.0 - 10.5 K/uL 9.8 9.9 15.9(H)  Hemoglobin 13.0 - 17.0 g/dL 11.1(L)  12.1(A) 10.8(L)  Hematocrit 39.0 - 52.0 % 34.7(L) 38(A) 33.6(L)  Platelets 150 - 400 K/uL 218 256 186   CMP Latest Ref Rng & Units 10/24/2020 09/26/2020 09/26/2020  Glucose 70 - 99 mg/dL - - -  BUN 4 - 21 9 - -  Creatinine 0.6 - 1.3 1.0 1.38(H) -  Sodium 137 - 147 136(A) - 137  Potassium 3.4 - 5.3 3.1(A) - 3.5  Chloride 99 - 108 101 - -  CO2 13 - 22 28(A) - -  Calcium 8.7 - 10.7 9.2 - -  Alkaline Phos 25 - 125 137(A) - -  AST 14 - 40 45(A) - -  ALT 10 - 40 20 - -          ASSESSMENT & PLAN:  A 65 y.o. male who qualifies for  ISS stage I lambda light chain multiple myeloma.  This is based upon his marrow having >10% plasma cells.  Cytogenetics and his myeloma FISH panel are pending.  The plan will be for this gentleman to start induction therapy with RVD (RevIimid, Velcade, Decdron).  I would consider adding daratumumab to this regimen, especially if pending studies show he has high risk features.  Revlimid will be given at 25 mg daily x 14 days; Velcade will be given at 1.3 mg/m2 SQ weekly; Decadron will be given at 40 mg daily.  His 1st cycle is scheduled for July 5th.  After 4 cycles of RVD, I will likely refer him to an academic center to discuss undergoing high-dose chemotherapy/autologous stem cell transplantation.  Maintenance Revlimid would also be entertained after a stem cell transplant.  I will also give this gentleman Zometa with each cycle of RVD to protect his bones against a potential fracture.  I will see this gentleman in late July 2022 before he heads into his 2nd cycle of RVD. The patient understands all the plans discussed today and is in agreement with them.   I, Rita Ohara, am acting as scribe for Marice Potter, MD    I have reviewed this report as typed by the medical scribe, and it is complete and accurate.  [name], MD

## 2020-11-06 DIAGNOSIS — Z510 Encounter for antineoplastic radiation therapy: Secondary | ICD-10-CM | POA: Diagnosis not present

## 2020-11-06 DIAGNOSIS — C9030 Solitary plasmacytoma not having achieved remission: Secondary | ICD-10-CM | POA: Diagnosis not present

## 2020-11-07 ENCOUNTER — Telehealth: Payer: Self-pay | Admitting: Oncology

## 2020-11-07 ENCOUNTER — Inpatient Hospital Stay (INDEPENDENT_AMBULATORY_CARE_PROVIDER_SITE_OTHER): Payer: BC Managed Care – PPO | Admitting: Oncology

## 2020-11-07 ENCOUNTER — Other Ambulatory Visit: Payer: Self-pay | Admitting: Oncology

## 2020-11-07 DIAGNOSIS — C9000 Multiple myeloma not having achieved remission: Secondary | ICD-10-CM | POA: Insufficient documentation

## 2020-11-07 DIAGNOSIS — C9030 Solitary plasmacytoma not having achieved remission: Secondary | ICD-10-CM

## 2020-11-07 DIAGNOSIS — Z510 Encounter for antineoplastic radiation therapy: Secondary | ICD-10-CM | POA: Diagnosis not present

## 2020-11-07 NOTE — Progress Notes (Signed)
START ON PATHWAY REGIMEN - Multiple Myeloma and Other Plasma Cell Dyscrasias     A cycle is every 21 days:     Bortezomib      Lenalidomide      Dexamethasone   **Always confirm dose/schedule in your pharmacy ordering system**  Patient Characteristics: Multiple Myeloma, Newly Diagnosed, Transplant Eligible, Unknown or Awaiting Test Results Disease Classification: Multiple Myeloma R-ISS Staging: I Therapeutic Status: Newly Diagnosed Is Patient Eligible for Transplant<= Transplant Eligible Risk Status: Awaiting Test Results Intent of Therapy: Non-Curative / Palliative Intent, Discussed with Patient

## 2020-11-07 NOTE — Telephone Encounter (Signed)
Per 6/22 los next appt scheduled and given to patient 

## 2020-11-08 ENCOUNTER — Encounter (HOSPITAL_COMMUNITY): Payer: Self-pay | Admitting: Oncology

## 2020-11-08 DIAGNOSIS — Z510 Encounter for antineoplastic radiation therapy: Secondary | ICD-10-CM | POA: Diagnosis not present

## 2020-11-08 DIAGNOSIS — C9030 Solitary plasmacytoma not having achieved remission: Secondary | ICD-10-CM | POA: Diagnosis not present

## 2020-11-08 LAB — SURGICAL PATHOLOGY

## 2020-11-09 DIAGNOSIS — C9030 Solitary plasmacytoma not having achieved remission: Secondary | ICD-10-CM | POA: Diagnosis not present

## 2020-11-09 DIAGNOSIS — Z510 Encounter for antineoplastic radiation therapy: Secondary | ICD-10-CM | POA: Diagnosis not present

## 2020-11-12 ENCOUNTER — Telehealth: Payer: Self-pay | Admitting: Oncology

## 2020-11-12 ENCOUNTER — Inpatient Hospital Stay: Payer: BC Managed Care – PPO | Admitting: Oncology

## 2020-11-12 ENCOUNTER — Encounter: Payer: Self-pay | Admitting: Hematology and Oncology

## 2020-11-12 ENCOUNTER — Inpatient Hospital Stay: Payer: BC Managed Care – PPO

## 2020-11-12 ENCOUNTER — Other Ambulatory Visit: Payer: Self-pay

## 2020-11-12 ENCOUNTER — Inpatient Hospital Stay (INDEPENDENT_AMBULATORY_CARE_PROVIDER_SITE_OTHER): Payer: BC Managed Care – PPO | Admitting: Hematology and Oncology

## 2020-11-12 DIAGNOSIS — Z510 Encounter for antineoplastic radiation therapy: Secondary | ICD-10-CM | POA: Diagnosis not present

## 2020-11-12 DIAGNOSIS — C9030 Solitary plasmacytoma not having achieved remission: Secondary | ICD-10-CM | POA: Diagnosis not present

## 2020-11-12 DIAGNOSIS — C9000 Multiple myeloma not having achieved remission: Secondary | ICD-10-CM

## 2020-11-12 LAB — BASIC METABOLIC PANEL
BUN: 14 (ref 4–21)
CO2: 28 — AB (ref 13–22)
Chloride: 104 (ref 99–108)
Creatinine: 0.7 (ref 0.6–1.3)
Glucose: 153
Potassium: 3.3 — AB (ref 3.4–5.3)
Sodium: 138 (ref 137–147)

## 2020-11-12 LAB — CBC AND DIFFERENTIAL
HCT: 35 — AB (ref 41–53)
Hemoglobin: 11.5 — AB (ref 13.5–17.5)
Neutrophils Absolute: 8.96
Platelets: 281 (ref 150–399)
WBC: 10.8

## 2020-11-12 LAB — HEPATIC FUNCTION PANEL
ALT: 21 (ref 10–40)
AST: 37 (ref 14–40)
Alkaline Phosphatase: 95 (ref 25–125)
Bilirubin, Total: 0.3

## 2020-11-12 LAB — CBC: RBC: 3.88 (ref 3.87–5.11)

## 2020-11-12 LAB — COMPREHENSIVE METABOLIC PANEL
Albumin: 4 (ref 3.5–5.0)
Calcium: 9 (ref 8.7–10.7)

## 2020-11-12 MED ORDER — ACYCLOVIR 400 MG PO TABS
400.0000 mg | ORAL_TABLET | Freq: Every day | ORAL | 3 refills | Status: DC
Start: 2020-11-12 — End: 2021-11-13

## 2020-11-12 MED ORDER — ONDANSETRON HCL 4 MG PO TABS: 4.0000 mg | ORAL_TABLET | ORAL | 3 refills | Status: DC | PRN

## 2020-11-12 MED ORDER — PROCHLORPERAZINE MALEATE 10 MG PO TABS: 10.0000 mg | ORAL_TABLET | Freq: Four times a day (QID) | ORAL | 3 refills | Status: DC | PRN

## 2020-11-12 MED ORDER — DEXAMETHASONE 4 MG PO TABS: 40.0000 mg | ORAL_TABLET | ORAL | 0 refills | Status: DC

## 2020-11-12 NOTE — Telephone Encounter (Signed)
Patient's sister notified of CX Follow Up w/Dr Bobby Rumpf.  Patient did arrive for Lab Work

## 2020-11-12 NOTE — Progress Notes (Signed)
The patient is a 65 year old male with newly diagnosed myeloma.  Patient presents to clinic today with his sister for chemotherapy education and palliative care consult.  We will start revlimid, dexamethasone, and velcade.  We will send in prescriptions for dexamethasone, acyclovir, prochlorperazine and ondansetron.  The patient verbalizes understanding of and agreement to the plan as discussed today.  Provided general information including the following: 1.  Date of education: 11/12/2020 2.  Physician name: Dr. Bobby Rumpf 3.  Diagnosis: Myeloma 4.  Stage: 5.  Control 6.  Chemotherapy plan including drugs and how often: Revlimid, Velcade, Dexamethasone 7.  Start date: 11/20/2020 8.  Other referrals: None at this time 9.  The patient is to call our office with any questions or concerns.  Our office number [phone], if after hours or on the weekend, call the same number and wait for the answering service.  There is always an oncologist on call 10.  Medications prescribed: Ondansetron, prochlorperazine, dexamethasone, acyclovir 11.  The patient has verbalized understanding of the treatment plan and has no barriers to adherence or understanding.  Obtained signed consent from patient.  Discussed symptoms including 1.  Low blood counts including red blood cells, white blood cells and platelets. 2. Infection including to avoid large crowds, wash hands frequently, and stay away from people who were sick.  If fever develops of 100.4 or higher, call our office. 3.  Mucositis-given instructions on mouth rinse (baking soda and salt mixture).  Keep mouth clean.  Use soft bristle toothbrush.  If mouth sores develop, call our clinic. 4.  Nausea/vomiting-gave prescriptions for ondansetron 4 mg every 4 hours as needed for nausea, may take around the clock if persistent.  Compazine 10 mg every 6 hours, may take around the clock if persistent. 5.  Diarrhea-use over-the-counter Imodium.  Call clinic if not  controlled. 6.  Constipation-use senna, 1 to 2 tablets twice a day.  If no BM in 2 to 3 days call the clinic. 7.  Loss of appetite-try to eat small meals every 2-3 hours.  Call clinic if not eating. 8.  Taste changes-zinc 500 mg daily.  If becomes severe call clinic. 9.  Alcoholic beverages. 10.  Drink 2 to 3 quarts of water per day. 11.  Peripheral neuropathy-patient to call if numbness or tingling in hands or feet is persistent   Gave information on the supportive care team and how to contact them regarding services.  Discussed advanced directives.  The patient does not have their advanced directives but will look at the copy provided in their notebook and will call with any questions. [employer] Social worker Advanced directives  Answered questions to patient satisfaction.  Patient is to call with any further questions or concerns.  The medication prescribed to the patient will be printed out from [email or web address] This will give the following information: Name of your medication Approved uses Dose and schedule Storage and handling Handling body fluids and waste Drug and food interactions Possible side effects and management Pregnancy, sexual activity, and contraception Obtaining medication   Dayton Scrape, FNP- [facility]

## 2020-11-12 NOTE — Telephone Encounter (Signed)
Per Tammy, Dr Bobby Rumpf stated he does not need to see patient today  Per Melissa, patient will still need Labs drawn  Unable to reach patient to give him the above Information

## 2020-11-13 ENCOUNTER — Encounter: Payer: Self-pay | Admitting: Oncology

## 2020-11-13 DIAGNOSIS — Z510 Encounter for antineoplastic radiation therapy: Secondary | ICD-10-CM | POA: Diagnosis not present

## 2020-11-13 DIAGNOSIS — C9030 Solitary plasmacytoma not having achieved remission: Secondary | ICD-10-CM | POA: Diagnosis not present

## 2020-11-14 ENCOUNTER — Ambulatory Visit: Payer: BC Managed Care – PPO | Admitting: Hematology and Oncology

## 2020-11-16 ENCOUNTER — Encounter: Payer: Self-pay | Admitting: Oncology

## 2020-11-20 ENCOUNTER — Inpatient Hospital Stay: Payer: BC Managed Care – PPO | Attending: Hematology and Oncology

## 2020-11-20 ENCOUNTER — Other Ambulatory Visit: Payer: Self-pay

## 2020-11-20 VITALS — BP 139/90 | HR 77 | Temp 98.1°F | Resp 18 | Ht 70.0 in | Wt 263.0 lb

## 2020-11-20 DIAGNOSIS — C9000 Multiple myeloma not having achieved remission: Secondary | ICD-10-CM | POA: Diagnosis not present

## 2020-11-20 DIAGNOSIS — Z5112 Encounter for antineoplastic immunotherapy: Secondary | ICD-10-CM | POA: Diagnosis not present

## 2020-11-20 DIAGNOSIS — R739 Hyperglycemia, unspecified: Secondary | ICD-10-CM | POA: Insufficient documentation

## 2020-11-20 DIAGNOSIS — E871 Hypo-osmolality and hyponatremia: Secondary | ICD-10-CM | POA: Diagnosis not present

## 2020-11-20 MED ORDER — ZOLEDRONIC ACID 4 MG/100ML IV SOLN
INTRAVENOUS | Status: AC
  Filled 2020-11-20: qty 100

## 2020-11-20 MED ORDER — SODIUM CHLORIDE 0.9 % IV SOLN
Freq: Once | INTRAVENOUS | Status: AC
Start: 2020-11-20 — End: 2020-11-20
  Filled 2020-11-20: qty 250

## 2020-11-20 MED ORDER — ZOLEDRONIC ACID 4 MG/100ML IV SOLN
4.0000 mg | Freq: Once | INTRAVENOUS | Status: AC
  Administered 2020-11-20: 4 mg via INTRAVENOUS

## 2020-11-20 MED ORDER — BORTEZOMIB CHEMO SQ INJECTION 3.5 MG (2.5MG/ML)
1.3000 mg/m2 | Freq: Once | INTRAMUSCULAR | Status: AC
  Administered 2020-11-20: 3 mg via SUBCUTANEOUS
  Filled 2020-11-20: qty 1.2

## 2020-11-20 NOTE — Progress Notes (Signed)
Patient will receive his Lenalidomide tomorrow 07/06 from [facility], $0 co-pay.

## 2020-11-20 NOTE — Patient Instructions (Signed)
Bortezomib injection What is this medication? BORTEZOMIB (bor TEZ oh mib) targets proteins in cancer cells and stops thecancer cells from growing. It treats multiple myeloma and mantle cell lymphoma. This medicine may be used for other purposes; ask your health care provider orpharmacist if you have questions. COMMON BRAND NAME(S): Velcade What should I tell my care team before I take this medication? They need to know if you have any of these conditions: dehydration diabetes (high blood sugar) heart disease liver disease tingling of the fingers or toes or other nerve disorder an unusual or allergic reaction to bortezomib, mannitol, boron, other medicines, foods, dyes, or preservatives pregnant or trying to get pregnant breast-feeding How should I use this medication? This medicine is injected into a vein or under the skin. It is given by ahealth care provider in a hospital or clinic setting. Talk to your health care provider about the use of this medicine in children.Special care may be needed. Overdosage: If you think you have taken too much of this medicine contact apoison control center or emergency room at once. NOTE: This medicine is only for you. Do not share this medicine with others. What if I miss a dose? Keep appointments for follow-up doses. It is important not to miss your dose.Call your health care provider if you are unable to keep an appointment. What may interact with this medication? This medicine may interact with the following medications: ketoconazole rifampin This list may not describe all possible interactions. Give your health care provider a list of all the medicines, herbs, non-prescription drugs, or dietary supplements you use. Also tell them if you smoke, drink alcohol, or use illegaldrugs. Some items may interact with your medicine. What should I watch for while using this medication? Your condition will be monitored carefully while you are receiving  thismedicine. You may need blood work done while you are taking this medicine. You may get drowsy or dizzy. Do not drive, use machinery, or do anything that needs mental alertness until you know how this medicine affects you. Do not stand up or sit up quickly, especially if you are an older patient. Thisreduces the risk of dizzy or fainting spells This medicine may increase your risk of getting an infection. Call your health care provider for advice if you get a fever, chills, sore throat, or other symptoms of a cold or flu. Do not treat yourself. Try to avoid being aroundpeople who are sick. Check with your health care provider if you have severe diarrhea, nausea, and vomiting, or if you sweat a lot. The loss of too much body fluid may make itdangerous for you to take this medicine. Do not become pregnant while taking this medicine or for 7 months after stopping it. Women should inform their health care provider if they wish to become pregnant or think they might be pregnant. Men should not father a child while taking this medicine and for 4 months after stopping it. There is a potential for serious harm to an unborn child. Talk to your health care provider for more information. Do not breast-feed an infant while taking thismedicine or for 2 months after stopping it. This medicine may make it more difficult to get pregnant or father a child.Talk to your health care provider if you are concerned about your fertility. What side effects may I notice from receiving this medication? Side effects that you should report to your doctor or health care professionalas soon as possible: allergic reactions (skin rash; itching or hives; swelling   of the face, lips, or tongue) bleeding (bloody or black, tarry stools; red or dark [place] urine; spitting up blood or [place] material that looks like coffee grounds; red spots on the skin; unusual bruising or bleeding from the eye, gums, or nose) blurred vision or changes in  vision confusion constipation headache heart failure (trouble breathing; fast, irregular heartbeat; sudden weight gain; swelling of the ankles, feet, hands) infection (fever, chills, cough, sore throat, pain or trouble passing urine) lack or loss of appetite liver injury (dark yellow or Kovacevic urine; general ill feeling or flu-like symptoms; loss of appetite, right upper belly pain; yellowing of the eyes or skin) low blood pressure (dizziness; feeling faint or lightheaded, falls; unusually weak or tired) muscle cramps pain, redness, or irritation at site where injected pain, tingling, numbness in the hands or feet seizures trouble breathing unusual bruising or bleeding Side effects that usually do not require medical attention (report to yourdoctor or health care professional if they continue or are bothersome): diarrhea nausea stomach pain trouble sleeping vomiting This list may not describe all possible side effects. Call your doctor for medical advice about side effects. You may report side effects to FDA at1-800-FDA-1088. Where should I keep my medication? This medicine is given in a hospital or clinic. It will not be stored at home. NOTE: This sheet is a summary. It may not cover all possible information. If you have questions about this medicine, talk to your doctor, pharmacist, orhealth care provider.  2022 Elsevier/Gold Standard (2020-04-26 13:22:53) Zoledronic Acid Injection (Hypercalcemia, Oncology) What is this medication? ZOLEDRONIC ACID (ZOE le dron ik AS id) slows calcium loss from bones. It high calcium levels in the blood from some kinds of cancer. It may be used in otherpeople at risk for bone loss. This medicine may be used for other purposes; ask your health care provider orpharmacist if you have questions. COMMON BRAND NAME(S): Zometa What should I tell my care team before I take this medication? They need to know if you have any of these  conditions: cancer dehydration dental disease kidney disease liver disease low levels of calcium in the blood lung or breathing disease (asthma) receiving steroids like dexamethasone or prednisone an unusual or allergic reaction to zoledronic acid, other medicines, foods, dyes, or preservatives pregnant or trying to get pregnant breast-feeding How should I use this medication? This drug is injected into a vein. It is given by a health care provider in [place] or clinic setting. Talk to your health care provider about the use of this drug in children.Special care may be needed. Overdosage: If you think you have taken too much of this medicine contact apoison control center or emergency room at once. NOTE: This medicine is only for you. Do not share this medicine with others. What if I miss a dose? Keep appointments for follow-up doses. It is important not to miss your dose.Call your health care provider if you are unable to keep an appointment. What may interact with this medication? certain antibiotics given by injection NSAIDs, medicines for pain and inflammation, like ibuprofen or naproxen some diuretics like bumetanide, furosemide teriparatide thalidomide This list may not describe all possible interactions. Give your health care provider a list of all the medicines, herbs, non-prescription drugs, or dietary supplements you use. Also tell them if you smoke, drink alcohol, or use illegaldrugs. Some items may interact with your medicine. What should I watch for while using this medication? Visit your health care provider for regular checks on your  progress. It may besome time before you see the benefit from this drug. Some people who take this drug have severe bone, joint, or muscle pain. This drug may also increase your risk for jaw problems or a broken thigh bone. Tell your health care provider right away if you have severe pain in your jaw, bones, joints, or muscles. Tell you health  care provider if you have any painthat does not go away or that gets worse. Tell your dentist and dental surgeon that you are taking this drug. You should not have major dental surgery while on this drug. See your dentist to have a dental exam and fix any dental problems before starting this drug. Take good care of your teeth while on this drug. Make sure you see your dentist forregular follow-up appointments. You should make sure you get enough calcium and vitamin D while you are taking this drug. Discuss the foods you eat and the vitamins you take with your healthcare provider. Check with your health care provider if you have severe diarrhea, nausea, and vomiting, or if you sweat a lot. The loss of too much body fluid may make itdangerous for you to take this drug. You may need blood work done while you are taking this drug. Do not become pregnant while taking this drug. Women should inform their health care provider if they wish to become pregnant or think they might be pregnant. There is potential for serious harm to an unborn child. Talk to your healthcare provider for more information. What side effects may I notice from receiving this medication? Side effects that you should report to your doctor or health care provider assoon as possible: allergic reactions (skin rash, itching or hives; swelling of the face, lips, or tongue) bone pain infection (fever, chills, cough, sore throat, pain or trouble passing urine) jaw pain, especially after dental work joint pain kidney injury (trouble passing urine or change in the amount of urine) low blood pressure (dizziness; feeling faint or lightheaded, falls; unusually weak or tired) low calcium levels (fast heartbeat; muscle cramps or pain; pain, tingling, or numbness in the hands or feet; seizures) low magnesium levels (fast, irregular heartbeat; muscle cramp or pain; muscle weakness; tremors; seizures) low red blood cell counts (trouble breathing;  feeling faint; lightheaded, falls; unusually weak or tired) muscle pain redness, blistering, peeling, or loosening of the skin, including inside the mouth severe diarrhea swelling of the ankles, feet, hands trouble breathing Side effects that usually do not require medical attention (report to yourdoctor or health care provider if they continue or are bothersome): anxious constipation coughing depressed mood eye irritation, itching, or pain fever general ill feeling or flu-like symptoms nausea pain, redness, or irritation at site where injected trouble sleeping This list may not describe all possible side effects. Call your doctor for medical advice about side effects. You may report side effects to FDA at1-800-FDA-1088. Where should I keep my medication? This drug is given in a hospital or clinic. It will not be stored at home. NOTE: This sheet is a summary. It may not cover all possible information. If you have questions about this medicine, talk to your doctor, pharmacist, orhealth care provider.  2022 Elsevier/Gold Standard (2019-02-17 09:13:00)

## 2020-11-22 ENCOUNTER — Telehealth: Payer: Self-pay

## 2020-11-22 NOTE — Telephone Encounter (Addendum)
11/22/20 @ 1110 - I spoke with pt. He denies N/V, diarrhea, constipation, skin reactions and fever since Velcade infusion. He received his Revlimid in the mail yesterday. He also started the Revlimid yesterday. Pt is to take dexamethasone 40mg  once weekly,as part of his treatment regimen. Dr Bobby Rumpf told him he could take the dexamethasone 4mg  tabs (10 tabs) once weekly or split the 10 tabs up over 5 days weekly. Pt prefers to take the dexamethasone 10 tabs over 5 days each week.  Pt reminded to call us if temp over 100.4 or higher, DAY OR NIGHT. Pt verbalized understanding.   11/22/20 @ 9735- I attempted call to pt to see how he was doing since Velcade infusion. Also, I wanted to see if he has received his Revlimid shipment. No answer.  ----- Message from Juanetta Beets, [facility] sent at 11/07/2020  1:25 PM EDT ----- Regarding: RE: New Velcade+Revlimid+dex Sorry typo - intended to say 7/19 for chemo ed, but I think Melissa may be out that week so they are trying to add sooner than 7/19. ----- Message ----- From: Dairl Ponder, RN Sent: 11/07/2020  11:59 AM EDT To: Juanetta Beets, RPH Subject: RE: New Velcade+Revlimid+dex                   Start 12/10/20 and ed on 7/29?? ----- Message ----- From: Juanetta Beets, [facility] Sent: 11/07/2020  11:51 AM EDT To: [facility] Subject: New Velcade+Revlimid+dex                       Pt will start RVD on 7/25.  Chemo ed appt has been requested for 7/29.  Pt will need rx for acyclovir BID for herpes zoster prophylaxis and dexamethasone 40 mg po at home once weekly.

## 2020-11-24 DIAGNOSIS — R0602 Shortness of breath: Secondary | ICD-10-CM | POA: Diagnosis not present

## 2020-11-24 DIAGNOSIS — I10 Essential (primary) hypertension: Secondary | ICD-10-CM | POA: Diagnosis not present

## 2020-11-24 DIAGNOSIS — F17210 Nicotine dependence, cigarettes, uncomplicated: Secondary | ICD-10-CM | POA: Diagnosis not present

## 2020-11-24 DIAGNOSIS — C9000 Multiple myeloma not having achieved remission: Secondary | ICD-10-CM | POA: Diagnosis not present

## 2020-11-24 DIAGNOSIS — J069 Acute upper respiratory infection, unspecified: Secondary | ICD-10-CM | POA: Diagnosis not present

## 2020-11-24 DIAGNOSIS — C9030 Solitary plasmacytoma not having achieved remission: Secondary | ICD-10-CM | POA: Diagnosis not present

## 2020-11-26 ENCOUNTER — Inpatient Hospital Stay: Payer: BC Managed Care – PPO

## 2020-11-26 ENCOUNTER — Other Ambulatory Visit: Payer: Self-pay | Admitting: Hematology and Oncology

## 2020-11-26 DIAGNOSIS — C9000 Multiple myeloma not having achieved remission: Secondary | ICD-10-CM

## 2020-11-26 DIAGNOSIS — C9030 Solitary plasmacytoma not having achieved remission: Secondary | ICD-10-CM | POA: Diagnosis not present

## 2020-11-26 LAB — HEPATIC FUNCTION PANEL
ALT: 24 (ref 10–40)
AST: 34 (ref 14–40)
Alkaline Phosphatase: 73 (ref 25–125)
Bilirubin, Total: 0.2

## 2020-11-26 LAB — CBC AND DIFFERENTIAL
HCT: 39 — AB (ref 41–53)
Hemoglobin: 12.7 — AB (ref 13.5–17.5)
Neutrophils Absolute: 7.13
Platelets: 147 — AB (ref 150–399)
WBC: 8.7

## 2020-11-26 LAB — BASIC METABOLIC PANEL
BUN: 15 (ref 4–21)
CO2: 24 — AB (ref 13–22)
Chloride: 98 — AB (ref 99–108)
Creatinine: 0.6 (ref 0.6–1.3)
Glucose: 249
Potassium: 3.6 (ref 3.4–5.3)
Sodium: 132 — AB (ref 137–147)

## 2020-11-26 LAB — CBC
MCV: 91 (ref 80–94)
RBC: 4.35 (ref 3.87–5.11)

## 2020-11-26 LAB — COMPREHENSIVE METABOLIC PANEL
Albumin: 4 (ref 3.5–5.0)
Calcium: 9.1 (ref 8.7–10.7)

## 2020-11-27 ENCOUNTER — Inpatient Hospital Stay: Payer: BC Managed Care – PPO

## 2020-11-27 ENCOUNTER — Other Ambulatory Visit: Payer: Self-pay

## 2020-11-27 VITALS — BP 156/63 | HR 99 | Temp 98.2°F | Resp 18 | Ht 70.0 in | Wt 250.0 lb

## 2020-11-27 DIAGNOSIS — E871 Hypo-osmolality and hyponatremia: Secondary | ICD-10-CM | POA: Diagnosis not present

## 2020-11-27 DIAGNOSIS — Z5112 Encounter for antineoplastic immunotherapy: Secondary | ICD-10-CM | POA: Diagnosis not present

## 2020-11-27 DIAGNOSIS — C9000 Multiple myeloma not having achieved remission: Secondary | ICD-10-CM | POA: Diagnosis not present

## 2020-11-27 DIAGNOSIS — R739 Hyperglycemia, unspecified: Secondary | ICD-10-CM | POA: Diagnosis not present

## 2020-11-27 MED ORDER — BORTEZOMIB CHEMO SQ INJECTION 3.5 MG (2.5MG/ML)
1.3000 mg/m2 | Freq: Once | INTRAMUSCULAR | Status: AC
  Administered 2020-11-27: 3 mg via SUBCUTANEOUS
  Filled 2020-11-27: qty 1.2

## 2020-11-27 NOTE — Patient Instructions (Signed)
[facility]  Discharge Instructions: Thank you for choosing [facility] to provide your oncology and hematology care.  If you have a lab appointment with the [facility], please go directly to the [facility] and check in at the registration area.   Wear comfortable clothing and clothing appropriate for easy access to any Portacath or PICC line.   We strive to give you quality time with your provider. You may need to reschedule your appointment if you arrive late (15 or more minutes).  Arriving late affects you and other patients whose appointments are after yours.  Also, if you miss three or more appointments without notifying the office, you may be dismissed from the clinic at the provider's discretion.      For prescription refill requests, have your pharmacy contact our office and allow 72 hours for refills to be completed.    Today you received the following chemotherapy and/or immunotherapy agents velcade      To help prevent nausea and vomiting after your treatment, we encourage you to take your nausea medication as directed.  BELOW ARE SYMPTOMS THAT SHOULD BE REPORTED IMMEDIATELY: *FEVER GREATER THAN 100.4 F (38 C) OR HIGHER *CHILLS OR SWEATING *NAUSEA AND VOMITING THAT IS NOT CONTROLLED WITH YOUR NAUSEA MEDICATION *UNUSUAL SHORTNESS OF BREATH *UNUSUAL BRUISING OR BLEEDING *URINARY PROBLEMS (pain or burning when urinating, or frequent urination) *BOWEL PROBLEMS (unusual diarrhea, constipation, pain near the anus) TENDERNESS IN MOUTH AND THROAT WITH OR WITHOUT PRESENCE OF ULCERS (sore throat, sores in mouth, or a toothache) UNUSUAL RASH, SWELLING OR PAIN  UNUSUAL VAGINAL DISCHARGE OR ITCHING   Items with * indicate a potential emergency and should be followed up as soon as possible or go to the Emergency Department if any problems should occur.  Please show the CHEMOTHERAPY ALERT CARD or IMMUNOTHERAPY ALERT CARD at check-in to the  Emergency Department and triage nurse.  Should you have questions after your visit or need to cancel or reschedule your appointment, please contact [facility]  Dept: [phone]  and follow the prompts.  Office hours are 8:00 a.m. to 4:30 p.m. Monday - Friday. Please note that voicemails left after 4:00 p.m. may not be returned until the following business day.  We are closed weekends and major holidays. You have access to a nurse at all times for urgent questions. Please call the main number to the clinic Dept: [phone] and follow the prompts.  For any non-urgent questions, you may also contact your provider using MyChart. We now offer e-Visits for anyone 2 and older to request care online for non-urgent symptoms. For details visit [email or web address].   Also download the MyChart app! Go to the app store, search "MyChart", open the app, select [facility], and log in with your MyChart username and password.  Due to Covid, a mask is required upon entering the hospital/clinic. If you do not have a mask, one will be given to you upon arrival. For doctor visits, patients may have 1 support person aged 64 or older with them. For treatment visits, patients cannot have anyone with them due to current Covid guidelines and our immunocompromised population.   Bortezomib injection What is this medication? BORTEZOMIB (bor TEZ oh mib) targets proteins in cancer cells and stops thecancer cells from growing. It treats multiple myeloma and mantle cell lymphoma. This medicine may be used for other purposes; ask your health care provider orpharmacist if you have questions. COMMON BRAND NAME(S):  Velcade What should I tell my care team before I take this medication? They need to know if you have any of these conditions: dehydration diabetes (high blood sugar) heart disease liver disease tingling of the fingers or toes or other nerve disorder an unusual or allergic reaction to  bortezomib, mannitol, boron, other medicines, foods, dyes, or preservatives pregnant or trying to get pregnant breast-feeding How should I use this medication? This medicine is injected into a vein or under the skin. It is given by ahealth care provider in a hospital or clinic setting. Talk to your health care provider about the use of this medicine in children.Special care may be needed. Overdosage: If you think you have taken too much of this medicine contact apoison control center or emergency room at once. NOTE: This medicine is only for you. Do not share this medicine with others. What if I miss a dose? Keep appointments for follow-up doses. It is important not to miss your dose.Call your health care provider if you are unable to keep an appointment. What may interact with this medication? This medicine may interact with the following medications: ketoconazole rifampin This list may not describe all possible interactions. Give your health care provider a list of all the medicines, herbs, non-prescription drugs, or dietary supplements you use. Also tell them if you smoke, drink alcohol, or use illegaldrugs. Some items may interact with your medicine. What should I watch for while using this medication? Your condition will be monitored carefully while you are receiving thismedicine. You may need blood work done while you are taking this medicine. You may get drowsy or dizzy. Do not drive, use machinery, or do anything that needs mental alertness until you know how this medicine affects you. Do not stand up or sit up quickly, especially if you are an older patient. Thisreduces the risk of dizzy or fainting spells This medicine may increase your risk of getting an infection. Call your health care provider for advice if you get a fever, chills, sore throat, or other symptoms of a cold or flu. Do not treat yourself. Try to avoid being aroundpeople who are sick. Check with your health care provider  if you have severe diarrhea, nausea, and vomiting, or if you sweat a lot. The loss of too much body fluid may make itdangerous for you to take this medicine. Do not become pregnant while taking this medicine or for 7 months after stopping it. Women should inform their health care provider if they wish to become pregnant or think they might be pregnant. Men should not father a child while taking this medicine and for 4 months after stopping it. There is a potential for serious harm to an unborn child. Talk to your health care provider for more information. Do not breast-feed an infant while taking thismedicine or for 2 months after stopping it. This medicine may make it more difficult to get pregnant or father a child.Talk to your health care provider if you are concerned about your fertility. What side effects may I notice from receiving this medication? Side effects that you should report to your doctor or health care professionalas soon as possible: allergic reactions (skin rash; itching or hives; swelling of the face, lips, or tongue) bleeding (bloody or black, tarry stools; red or dark [place] urine; spitting up blood or [place] material that looks like coffee grounds; red spots on the skin; unusual bruising or bleeding from the eye, gums, or nose) blurred vision or changes in vision confusion  unusual bruising or bleeding from the eye, gums, or nose) blurred vision or changes in vision confusion constipation headache heart failure (trouble breathing; fast, irregular heartbeat; sudden weight gain; swelling of the ankles, feet, hands) infection (fever, chills, cough, sore throat, pain or trouble passing urine) lack or loss of appetite liver injury (dark yellow or [place]arnan urine; general ill feeling or flu-like symptoms; loss of appetite, right upper belly pain; yellowing of the eyes or skin) low blood pressure (dizziness; feeling faint or lightheaded, falls; unusually weak or tired) muscle cramps pain, redness, or irritation at site where injected pain, tingling, numbness in the  hands or feet seizures trouble breathing unusual bruising or bleeding Side effects that usually do not require medical attention (report to your doctor or health care professional if they continue or are bothersome): diarrhea nausea stomach pain trouble sleeping vomiting This list may not describe all possible side effects. Call your doctor for medical advice about side effects. You may report side effects to FDA at 1-800-FDA-1088. Where should I keep my medication? This medicine is given in a hospital or clinic. It will not be stored at home. NOTE: This sheet is a summary. It may not cover all possible information. If you have questions about this medicine, talk to your doctor, pharmacist, or health care provider.  2022 Elsevier/Gold Standard (2020-04-26 13:22:53)  

## 2020-11-28 ENCOUNTER — Telehealth: Payer: Self-pay

## 2020-11-28 NOTE — Telephone Encounter (Signed)
Dr Bobby Rumpf, Estill Dooms, pharmacist, Almira Bar, NP, & Willaim Sheng, all notified of pt's symptoms and phone conversation.

## 2020-11-28 NOTE — Telephone Encounter (Signed)
I spoke with pt to see how he is tolerating the Revlimid. Pt denies diarrhea, N/V, and rashes/itching. He is taking the Revlimid with breakfast daily. Denies missed doses.  Pt sounds like he has stuffy nose, his voice is nasal sounding. He reports he was sent to the ED from here last week, and "they didn't do anything. They told me I had a virus, and didn't give me antibiotics. I still have a deep digging cough. My nose is burning. I'm dizzy headed, and my eyes are blurry. I', taking a multivitamin, Vit C, drinking orange juice and a boost every morning". Pt denies falls. I encouraged him to change positions slowly and that he needs to be tested for COVID. Pt has not had any COVID vaccines. "It ticks me off that everybody wants to think everyone has COVID. They are lying to people. There ain't no vaccine. If people would research this, they would find out". I told pt that he would need to be tested for COVID before coming into appt on Mon, 12/03/20. Pt stated, "So you are telling me I have to get tested before I come"? I replied , Yes sir, this is the [facility] policy to protect you and other patients. COVID tests can be done at [facility] or doctor's offices. He then replied, "I don't have to do that. I guess I'll be checking into other options then. [facility] only [facility] I can go".

## 2020-11-30 ENCOUNTER — Encounter: Payer: Self-pay | Admitting: Oncology

## 2020-11-30 NOTE — Progress Notes (Signed)
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  This is based upon his marrow having >10% plasma cells.  Cytogenetics was normal, but the myeloma FISH panel could not be done due to insufficient quantity.  This gentleman started induction therapy with RVD (RevIimid, Velcade, Decadron), as well as Zometa every 3 weeks on July 5th. After 4 cycles of RVD, we will likely refer him to an academic center to discuss undergoing high-dose chemotherapy/autologous stem cell transplantation.  Maintenance Revlimid would also be entertained after a stem cell transplant.   INTERVAL HISTORY:  Andrew Espinoza is here today for repeat clinical assessment prior to day 15 of cycle 1 of RVD. He completes this cycle of Revlimid tomorrow and will receive Velcade tomorrow as well. He completed 4 days of Decadron yesterday. He states that he has had symptoms of a sinus infection for about 2 weeks. He has required treatment for sinus infections in the past. He was initially seen in the emergency room on July 9th and felt to have a viral infection, so was given symptomatic care. Of note, he had a CTA chest  In the ER, which did not reveal any evidence of pulmonary embolism or other acute abnormality.  He declined a COVID test in the ER, so when he contacted our office last week, we told him we required a COVID test.  He did a home test this weekend, which was negative.  He continues to have symptoms of sinus infection with sinus congestion and pressure, tan sinus drainage, bad taste and upper teeth pain. He denies sore  throat, lack of taste or smell, nausea, vomiting, headache or myalgias. He denies fevers or chills.  He has had night sweats. He denies pain. His appetite is fairly good. His weight has decreased 5 pounds over last 3 weeks .  He states he has been drinking a lot of orange use and taking 1500 mg of vitamin-C daily in addition to drinking water.  REVIEW OF SYSTEMS:  Review of Systems  Constitutional:  Positive for diaphoresis (night sweats). Negative for appetite change, chills, fatigue, fever and unexpected weight change.  HENT:   Negative for lump/mass, mouth sores and sore throat.   Respiratory:  Positive for cough (intermittent, dry) and shortness of breath (chronic dyspnea with exertion, which is stable).   Cardiovascular:  Negative for chest pain and leg swelling.  Gastrointestinal:  Negative for abdominal pain, constipation, diarrhea, nausea and vomiting.  Genitourinary:  Negative for difficulty urinating, dysuria, frequency and hematuria.   Musculoskeletal:  Negative for arthralgias, back pain and myalgias.  Skin:  Negative for itching, rash and wound.  Neurological:  Negative for dizziness, extremity weakness, headaches, light-headedness and numbness.  Hematological:  Negative for adenopathy.  Psychiatric/Behavioral:  Negative for depression and sleep disturbance. The patient is not nervous/anxious.     VITALS:  Blood pressure (!) 169/78, pulse (!) 114, temperature 98.7 F (37.1 C), temperature source Oral, resp. rate 20, height _0  (1.778 m), weight 253 lb (114.8 kg), SpO2 95 %.  Wt Readings from Last 3 Encounters:  12/03/20 253 lb (114.8 kg)  11/27/20 250 lb (113.4 kg)  11/20/20 263 lb (119.3 kg)    Body mass index is 36.3 kg/m.  Performance status (ECOG): 1 - Symptomatic but completely ambulatory  PHYSICAL EXAM:  Physical Exam Vitals and nursing note reviewed.  Constitutional:      General: He is not in acute distress.    Appearance: Normal appearance. He is normal  weight.  HENT:     Head: Normocephalic and atraumatic.     Mouth/Throat:     Mouth: Mucous membranes are moist.     Pharynx: Oropharyngeal exudate and posterior oropharyngeal erythema present.  Eyes:     General: No scleral icterus.    Extraocular Movements: Extraocular movements intact.     Conjunctiva/sclera: Conjunctivae normal.     Pupils: Pupils are equal, round, and reactive to light.  Cardiovascular:     Rate and Rhythm: Regular rhythm. Tachycardia present.     Heart sounds: Normal heart sounds. No murmur heard.   No friction rub. No gallop.     Comments: Mild tachycardia Pulmonary:     Effort: Pulmonary effort is normal.     Breath sounds: Normal breath sounds. No wheezing, rhonchi or rales.  Chest:  Breasts:    Right: No axillary adenopathy or supraclavicular adenopathy.     Left: No axillary adenopathy or supraclavicular adenopathy.  Abdominal:     General: Bowel sounds are normal. There is no distension.     Palpations: Abdomen is soft. There is no hepatomegaly, splenomegaly or mass.     Tenderness: There is no abdominal tenderness.  Musculoskeletal:        General: Normal range of motion.     Cervical back: Normal range of motion and neck supple. No tenderness.     Right lower leg: No edema.     Left lower leg: No edema.     Comments:  The incision of his mid to lower back is well healed  Lymphadenopathy:     Cervical: No cervical adenopathy.     Upper Body:     Right upper body: No supraclavicular or axillary adenopathy.     Left upper body: No supraclavicular or axillary adenopathy.     Lower Body: No right inguinal adenopathy. No left inguinal adenopathy.  Skin:    General: Skin is warm and dry.     Coloration: Skin is not jaundiced.     Findings: No rash.  Neurological:     Mental Status: He is alert and oriented to person, place, and time.     Cranial Nerves: No cranial nerve deficit.  Psychiatric:        Mood and Affect: Mood normal.        Behavior:  Behavior normal.        Thought Content: Thought content normal.    LABS:   CBC Latest Ref Rng & Units 12/03/2020 11/26/2020 11/12/2020  WBC - 17.1 8.7 10.8  Hemoglobin 13.5 - 17.5 13.5 12.7(A) 11.5(A)  Hematocrit 41 - 53 41 39(A) 35(A)  Platelets 150 - 399 184 147(A) 281   CMP Latest Ref Rng & Units 12/03/2020 11/26/2020 11/12/2020  Glucose 70 - 99 mg/dL - - -  BUN 4 - 21 22(A) 15 14  Creatinine 0.6 - 1.3 0.7 0.6 0.7  Sodium 137 - 147 132(A) 132(A) 138  Potassium 3.4 - 5.3 4.0 3.6 3.3(A)  Chloride 99 - 108 101 98(A) 104  CO2 13 - 22 24(A) 24(A) 28(A)  Calcium 8.7 -  10.7 9.5 9.1 9.0  Alkaline Phos 25 - 125 73 73 95  AST 14 - 40 31 34 37  ALT 10 - 40 _0 No results found for: CEA1 / No results found for: CEA1 No results found for: PSA1 No results found for: WUJ811 No results found for: BJY782  Lab Results  Component Value Date   TOTALPROTELP 6.3 10/24/2020   ALBUMINELP 3.8 10/24/2020   A1GS 0.3 10/24/2020   A2GS 0.7 10/24/2020   BETS 1.0 10/24/2020   GAMS 0.5 10/24/2020   MSPIKE 0.3 (H) 10/24/2020   SPEI Comment 10/24/2020   No results found for: TIBC, FERRITIN, IRONPCTSAT No results found for: LDH  STUDIES:  No results found.    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 He has never used smokeless tobacco. He reports that he does not drink alcohol and does not use drugs.The patient is alone today.  Allergies:  Allergies  Allergen Reactions   Elemental Sulfur Rash   Penicillins Rash   Seasonal Ic [Cholestatin]     Does not recall this   Clarithromycin     Can't remember reaction    Current Medications: Current Outpatient Medications  Medication Sig Dispense Refill   levofloxacin (LEVAQUIN) 500 MG tablet Take 1 tablet (500 mg total) by mouth daily. 7 tablet 0   acyclovir (ZOVIRAX) 400 MG tablet Take 1 tablet (400 mg total) by mouth daily. 90 tablet 3   albuterol (VENTOLIN HFA) 108 (90 Base) MCG/ACT inhaler Inhale 1-2 puffs into the lungs every 6 (six) hours as needed for wheezing.     aspirin 81 MG EC tablet Take 81 mg by mouth daily.     BREO ELLIPTA 100-25 MCG/INH AEPB Inhale 1 puff into the lungs daily.     buPROPion (WELLBUTRIN XL) 150 MG 24 hr tablet Take 150 mg by mouth daily.     dexamethasone (DECADRON) 4 MG tablet Take 10 tablets (40 mg total) by mouth once a week. 40 tablet 0   dicyclomine (BENTYL) 10 MG capsule Take 10 mg by mouth 3 (three) times daily as needed for spasms.     Loperamide HCl 1 MG/7.5ML LIQD Take 2 mg by mouth daily as needed (diarrhea or loose stools).     Magnesium 100 MG TABS Take 100 mg by mouth in the morning and at bedtime.  omeprazole (PRILOSEC OTC) 20 MG tablet Take 20 mg by mouth daily.     ondansetron (ZOFRAN) 4 MG tablet Take 1 tablet (4 mg total) by mouth every 4 (four) hours as needed for nausea. 90 tablet 3   pravastatin (PRAVACHOL) 20 MG tablet Take 20 mg by mouth daily.     prochlorperazine (COMPAZINE) 10 MG tablet Take 1 tablet (10 mg total) by mouth every 6 (six) hours as needed for nausea or  vomiting. 90 tablet 3   tamsulosin (FLOMAX) 0.4 MG CAPS capsule Take 0.8 mg by mouth daily.     tiZANidine (ZANAFLEX) 4 MG tablet Take 1 tablet (4 mg total) by mouth every 6 (six) hours as needed for muscle spasms. 60 tablet 0   venlafaxine XR (EFFEXOR-XR) 150 MG 24 hr capsule Take 150 mg by mouth daily.     No current facility-administered medications for this visit.     ASSESSMENT & PLAN:   Assessment/Plan:  Everet Flagg is a 65 y.o. 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  This is based upon his marrow having >10% plasma cells.  Cytogenetics were normal, but his myeloma FISH panel was unable to be done due to insufficient quantity. He is receiving induction therapy with RVD (RevIimid, Velcade, Decadron).  He is also receiving Zometa with each cycle of RVD to protect his bones against potential fracture. He is tolerating treatment well. He will proceed with day 15 of cycle 1 tomorrow. He has continued sinus symptoms concerning for bacterial infection especially given the leukocytosis which in part is due to steroid use. Due to his penicillin allergy, I will place him on levofloxacin 500 mg daily for 7 days. I advised him against taking high-dose vitamin C while on chemotherapy, so he will decrease this to 500 mg or less.  He has hyperglycemia with mild hyponatremia, likely due to steroid use, we will continue to monitor this. After 4 cycles of RVD, we will likely refer him to an academic center to discuss undergoing high-dose chemotherapy/autologous stem cell transplantation.  Maintenance Revlimid would also be entertained after a stem cell transplant. We will plan to see him back in 1 week as previously scheduled prior to a 2nd cycle of RVD. The patient understands the plans discussed today and is in agreement with them.  He knows to contact our office if he develops concerns prior to his next appointment.     Marvia Pickles, PA-C

## 2020-12-03 ENCOUNTER — Other Ambulatory Visit: Payer: BC Managed Care – PPO

## 2020-12-03 ENCOUNTER — Encounter: Payer: Self-pay | Admitting: Hematology and Oncology

## 2020-12-03 ENCOUNTER — Other Ambulatory Visit: Payer: Self-pay | Admitting: Pharmacist

## 2020-12-03 ENCOUNTER — Inpatient Hospital Stay (INDEPENDENT_AMBULATORY_CARE_PROVIDER_SITE_OTHER): Payer: BC Managed Care – PPO | Admitting: Hematology and Oncology

## 2020-12-03 ENCOUNTER — Inpatient Hospital Stay: Payer: BC Managed Care – PPO

## 2020-12-03 VITALS — BP 169/78 | HR 114 | Temp 98.7°F | Resp 20 | Ht 70.0 in | Wt 253.0 lb

## 2020-12-03 DIAGNOSIS — C9000 Multiple myeloma not having achieved remission: Secondary | ICD-10-CM

## 2020-12-03 DIAGNOSIS — C9030 Solitary plasmacytoma not having achieved remission: Secondary | ICD-10-CM | POA: Diagnosis not present

## 2020-12-03 LAB — CBC: RBC: 4.58 (ref 3.87–5.11)

## 2020-12-03 LAB — BASIC METABOLIC PANEL
BUN: 22 — AB (ref 4–21)
CO2: 24 — AB (ref 13–22)
Chloride: 101 (ref 99–108)
Creatinine: 0.7 (ref 0.6–1.3)
Glucose: 230
Potassium: 4 (ref 3.4–5.3)
Sodium: 132 — AB (ref 137–147)

## 2020-12-03 LAB — HEPATIC FUNCTION PANEL
ALT: 26 (ref 10–40)
AST: 31 (ref 14–40)
Alkaline Phosphatase: 73 (ref 25–125)
Bilirubin, Total: 0.4

## 2020-12-03 LAB — CBC AND DIFFERENTIAL
HCT: 41 (ref 41–53)
Hemoglobin: 13.5 (ref 13.5–17.5)
Neutrophils Absolute: 15.9
Platelets: 184 (ref 150–399)
WBC: 17.1

## 2020-12-03 LAB — COMPREHENSIVE METABOLIC PANEL
Albumin: 4 (ref 3.5–5.0)
Calcium: 9.5 (ref 8.7–10.7)

## 2020-12-03 MED ORDER — LEVOFLOXACIN 500 MG PO TABS: 500.0000 mg | ORAL_TABLET | Freq: Every day | ORAL | 0 refills | Status: DC

## 2020-12-04 ENCOUNTER — Other Ambulatory Visit: Payer: BC Managed Care – PPO

## 2020-12-04 ENCOUNTER — Other Ambulatory Visit: Payer: Self-pay

## 2020-12-04 ENCOUNTER — Inpatient Hospital Stay: Payer: BC Managed Care – PPO

## 2020-12-04 VITALS — BP 135/84 | HR 113 | Temp 98.2°F | Resp 18 | Ht 70.0 in | Wt 250.0 lb

## 2020-12-04 DIAGNOSIS — C9000 Multiple myeloma not having achieved remission: Secondary | ICD-10-CM | POA: Diagnosis not present

## 2020-12-04 DIAGNOSIS — Z5112 Encounter for antineoplastic immunotherapy: Secondary | ICD-10-CM | POA: Diagnosis not present

## 2020-12-04 DIAGNOSIS — E871 Hypo-osmolality and hyponatremia: Secondary | ICD-10-CM | POA: Diagnosis not present

## 2020-12-04 DIAGNOSIS — R739 Hyperglycemia, unspecified: Secondary | ICD-10-CM | POA: Diagnosis not present

## 2020-12-04 MED ORDER — BORTEZOMIB CHEMO SQ INJECTION 3.5 MG (2.5MG/ML)
1.3000 mg/m2 | Freq: Once | INTRAMUSCULAR | Status: AC
  Administered 2020-12-04: 3 mg via SUBCUTANEOUS
  Filled 2020-12-04: qty 1.2

## 2020-12-04 NOTE — Patient Instructions (Signed)
Bortezomib injection What is this medication? BORTEZOMIB (bor TEZ oh mib) targets proteins in cancer cells and stops thecancer cells from growing. It treats multiple myeloma and mantle cell lymphoma. This medicine may be used for other purposes; ask your health care provider orpharmacist if you have questions. COMMON BRAND NAME(S): Velcade What should I tell my care team before I take this medication? They need to know if you have any of these conditions: dehydration diabetes (high blood sugar) heart disease liver disease tingling of the fingers or toes or other nerve disorder an unusual or allergic reaction to bortezomib, mannitol, boron, other medicines, foods, dyes, or preservatives pregnant or trying to get pregnant breast-feeding How should I use this medication? This medicine is injected into a vein or under the skin. It is given by ahealth care provider in a hospital or clinic setting. Talk to your health care provider about the use of this medicine in children.Special care may be needed. Overdosage: If you think you have taken too much of this medicine contact apoison control center or emergency room at once. NOTE: This medicine is only for you. Do not share this medicine with others. What if I miss a dose? Keep appointments for follow-up doses. It is important not to miss your dose.Call your health care provider if you are unable to keep an appointment. What may interact with this medication? This medicine may interact with the following medications: ketoconazole rifampin This list may not describe all possible interactions. Give your health care provider a list of all the medicines, herbs, non-prescription drugs, or dietary supplements you use. Also tell them if you smoke, drink alcohol, or use illegaldrugs. Some items may interact with your medicine. What should I watch for while using this medication? Your condition will be monitored carefully while you are receiving  thismedicine. You may need blood work done while you are taking this medicine. You may get drowsy or dizzy. Do not drive, use machinery, or do anything that needs mental alertness until you know how this medicine affects you. Do not stand up or sit up quickly, especially if you are an older patient. Thisreduces the risk of dizzy or fainting spells This medicine may increase your risk of getting an infection. Call your health care provider for advice if you get a fever, chills, sore throat, or other symptoms of a cold or flu. Do not treat yourself. Try to avoid being aroundpeople who are sick. Check with your health care provider if you have severe diarrhea, nausea, and vomiting, or if you sweat a lot. The loss of too much body fluid may make itdangerous for you to take this medicine. Do not become pregnant while taking this medicine or for 7 months after stopping it. Women should inform their health care provider if they wish to become pregnant or think they might be pregnant. Men should not father a child while taking this medicine and for 4 months after stopping it. There is a potential for serious harm to an unborn child. Talk to your health care provider for more information. Do not breast-feed an infant while taking thismedicine or for 2 months after stopping it. This medicine may make it more difficult to get pregnant or father a child.Talk to your health care provider if you are concerned about your fertility. What side effects may I notice from receiving this medication? Side effects that you should report to your doctor or health care professionalas soon as possible: allergic reactions (skin rash; itching or hives; swelling   of the face, lips, or tongue) bleeding (bloody or black, tarry stools; red or dark Pl[place]her urine; spitting up blood or Stennis material that looks like coffee grounds; red spots on the skin; unusual bruising or bleeding from the eye, gums, or nose) blurred vision or changes in  vision confusion constipation headache heart failure (trouble breathing; fast, irregular heartbeat; sudden weight gain; swelling of the ankles, feet, hands) infection (fever, chills, cough, sore throat, pain or trouble passing urine) lack or loss of appetite liver injury (dark yellow or Sallie urine; general ill feeling or flu-like symptoms; loss of appetite, right upper belly pain; yellowing of the eyes or skin) low blood pressure (dizziness; feeling faint or lightheaded, falls; unusually weak or tired) muscle cramps pain, redness, or irritation at site where injected pain, tingling, numbness in the hands or feet seizures trouble breathing unusual bruising or bleeding Side effects that usually do not require medical attention (report to yourdoctor or health care professional if they continue or are bothersome): diarrhea nausea stomach pain trouble sleeping vomiting This list may not describe all possible side effects. Call your doctor for medical advice about side effects. You may report side effects to FDA at1-800-FDA-1088. Where should I keep my medication? This medicine is given in a hospital or clinic. It will not be stored at home. NOTE: This sheet is a summary. It may not cover all possible information. If you have questions about this medicine, talk to your doctor, pharmacist, orhealth care provider.  2022 Elsevier/Gold Standard (2020-04-26 13:22:53)  

## 2020-12-04 NOTE — Progress Notes (Signed)
PT AFFIRMED TODAY THAT HE IS TAKING HIS ACYCLOVIR AND DECADRON AT HOME.

## 2020-12-05 ENCOUNTER — Telehealth: Payer: Self-pay | Admitting: Hematology and Oncology

## 2020-12-05 NOTE — Telephone Encounter (Signed)
No LOS entered on 7/18

## 2020-12-06 ENCOUNTER — Telehealth: Payer: Self-pay

## 2020-12-06 NOTE — Telephone Encounter (Addendum)
Pt notified of Kelli's recommendations below. He verbalized understanding. ----- Message from Marvia Pickles, PA-C sent at 12/05/2020  4:45 PM EDT ----- Regarding: RE: Doesnt feel better Contact: [phone] Please have him try OTC Flonase nasal spray (fluticasone) 2 sprays per nostril daily until improvement, then 1 spray per nostril daily.  ----- Message ----- From: Dairl Ponder, RN Sent: 12/05/2020   2:38 PM EDT To: Marvia Pickles, PA-C Subject: Doesnt feel better                             Pt called, states you told him to call back if he wasn't feeling any better in 2 days. He states he isn't better, has all the same symptoms, & know he is having dizziness pretty much all the time. Please advise.

## 2020-12-07 ENCOUNTER — Encounter: Payer: Self-pay | Admitting: Oncology

## 2020-12-10 ENCOUNTER — Encounter: Payer: Self-pay | Admitting: Hematology and Oncology

## 2020-12-10 ENCOUNTER — Telehealth: Payer: Self-pay | Admitting: Oncology

## 2020-12-10 ENCOUNTER — Telehealth: Payer: Self-pay

## 2020-12-10 ENCOUNTER — Ambulatory Visit: Payer: BC Managed Care – PPO

## 2020-12-10 ENCOUNTER — Inpatient Hospital Stay (INDEPENDENT_AMBULATORY_CARE_PROVIDER_SITE_OTHER): Payer: BC Managed Care – PPO | Admitting: Hematology and Oncology

## 2020-12-10 ENCOUNTER — Inpatient Hospital Stay: Payer: BC Managed Care – PPO

## 2020-12-10 VITALS — BP 133/86 | HR 87 | Temp 98.3°F | Resp 18 | Ht 70.0 in | Wt 260.1 lb

## 2020-12-10 DIAGNOSIS — Z5112 Encounter for antineoplastic immunotherapy: Secondary | ICD-10-CM | POA: Diagnosis not present

## 2020-12-10 DIAGNOSIS — C9000 Multiple myeloma not having achieved remission: Secondary | ICD-10-CM

## 2020-12-10 DIAGNOSIS — C9030 Solitary plasmacytoma not having achieved remission: Secondary | ICD-10-CM | POA: Diagnosis not present

## 2020-12-10 DIAGNOSIS — R739 Hyperglycemia, unspecified: Secondary | ICD-10-CM | POA: Diagnosis not present

## 2020-12-10 DIAGNOSIS — E871 Hypo-osmolality and hyponatremia: Secondary | ICD-10-CM | POA: Diagnosis not present

## 2020-12-10 LAB — COMPREHENSIVE METABOLIC PANEL
Albumin: 3.6 (ref 3.5–5.0)
Calcium: 10.3 (ref 8.7–10.7)

## 2020-12-10 LAB — BASIC METABOLIC PANEL
BUN: 30 — AB (ref 4–21)
CO2: 23 — AB (ref 13–22)
Chloride: 100 (ref 99–108)
Creatinine: 0.8 (ref 0.6–1.3)
Glucose: 272
Potassium: 4.1 (ref 3.4–5.3)
Sodium: 131 — AB (ref 137–147)

## 2020-12-10 LAB — CBC AND DIFFERENTIAL
HCT: 38 — AB (ref 41–53)
Hemoglobin: 12.2 — AB (ref 13.5–17.5)
Neutrophils Absolute: 9.01
Platelets: 175 (ref 150–399)
WBC: 11.4

## 2020-12-10 LAB — CBC: RBC: 4.19 (ref 3.87–5.11)

## 2020-12-10 LAB — HEPATIC FUNCTION PANEL
ALT: 27 (ref 10–40)
AST: 28 (ref 14–40)
Alkaline Phosphatase: 59 (ref 25–125)
Bilirubin, Total: 0.2

## 2020-12-10 NOTE — Telephone Encounter (Signed)
Per 7/25 LOS, Patient scheduled for 8/15 to end of Aug Appt's.  Gave patient Appt Calendar

## 2020-12-10 NOTE — Progress Notes (Signed)
[facility]  [address] 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  This is based upon his marrow having >10% plasma cells.  Cytogenetics was normal, but the myeloma FISH panel could not be done due to insufficient quantity.  This gentleman started induction therapy with RVD (RevIimid, Velcade, Decadron), as well as Zometa every 3 weeks on July 5th. After 4 cycles of RVD, we will likely refer him to an academic center to discuss undergoing high-dose chemotherapy/autologous stem cell transplantation.  Maintenance Revlimid would also be entertained after a stem cell transplant.   INTERVAL HISTORY:  Andrew Espinoza is here today for repeat clinical assessment prior to day 1 of cycle 2 of RVD. He completes antibiotics for a sinus infection today for which he states he feels much better. He continues to have chronic back pain which he rates 3/10 today. His appetite is good and his weight is up 7 pounds. He has some shortness of breath and attributes this to his asthma and sleep apnea. He denies fever, chills, nausea or vomiting. He denies issue with bowel or bladder.  REVIEW OF SYSTEMS:  Review of Systems  Constitutional:  Negative for appetite change, chills, diaphoresis (night sweats), fatigue, fever and unexpected weight change.  HENT:   Negative for hearing loss, lump/mass, mouth sores, nosebleeds, sore throat, tinnitus, trouble swallowing and voice change.   Eyes:  Negative for eye problems and icterus.  Respiratory:  Positive for shortness of breath (chronic dyspnea with exertion, which is stable). Negative for  chest tightness, cough (intermittent, dry), hemoptysis and wheezing.   Cardiovascular:  Negative for chest pain, leg swelling and palpitations.  Gastrointestinal:  Negative for abdominal distention, abdominal pain, blood in stool, constipation, diarrhea, nausea, rectal pain and vomiting.  Endocrine: Negative for hot flashes.  Genitourinary:  Negative for bladder incontinence, difficulty urinating, dyspareunia, dysuria, frequency, hematuria and nocturia.   Musculoskeletal:  Positive for back pain. Negative for arthralgias, flank pain, gait problem, myalgias, neck pain and neck stiffness.  Skin:  Negative for itching, rash and wound.  Neurological:  Negative for dizziness, extremity weakness, gait problem, headaches, light-headedness, numbness, seizures and speech difficulty.  Hematological:  Negative for adenopathy. Does not bruise/bleed easily.  Psychiatric/Behavioral:  Negative for confusion, decreased concentration, depression, sleep disturbance and suicidal ideas. The patient is not nervous/anxious.     VITALS:  Blood pressure 133/86, pulse 87, temperature 98.3 F (36.8 C), temperature source Oral, resp. rate 18, height $RemoveBe'5\' 10"'BugkSuwhg$  (1.778 m), weight 260 lb 1.6 oz (118 kg), SpO2 96 %.  Wt Readings from Last 3 Encounters:  12/10/20 260 lb 1.6 oz (118 kg)  12/04/20 250 lb (113.4 kg)  12/03/20 253 lb (114.8 kg)    Body mass index is 37.32 kg/m.  Performance status (ECOG): 1 - Symptomatic but completely ambulatory  PHYSICAL EXAM:  Physical Exam Vitals and nursing note reviewed.  Constitutional:      General: He is not in acute distress.    Appearance: Normal appearance. He is normal weight. He is not ill-appearing, toxic-appearing or diaphoretic.  HENT:     Head: Normocephalic and  atraumatic.     Nose: Nose normal. No congestion or rhinorrhea.     Mouth/Throat:     Mouth: Mucous membranes are moist.     Pharynx: No oropharyngeal exudate or posterior oropharyngeal erythema.  Eyes:      General: No scleral icterus.       Right eye: No discharge.        Left eye: No discharge.     Extraocular Movements: Extraocular movements intact.     Conjunctiva/sclera: Conjunctivae normal.     Pupils: Pupils are equal, round, and reactive to light.  Neck:     Vascular: No carotid bruit.  Cardiovascular:     Rate and Rhythm: Regular rhythm. Tachycardia present.     Heart sounds: Normal heart sounds. No murmur heard.   No friction rub. No gallop.     Comments: Mild tachycardia Pulmonary:     Effort: Pulmonary effort is normal. No respiratory distress.     Breath sounds: Normal breath sounds. No stridor. No wheezing, rhonchi or rales.  Chest:     Chest wall: No tenderness.  Breasts:    Right: No axillary adenopathy or supraclavicular adenopathy.     Left: No axillary adenopathy or supraclavicular adenopathy.  Abdominal:     General: Abdomen is flat. Bowel sounds are normal. There is no distension.     Palpations: Abdomen is soft. There is no hepatomegaly, splenomegaly or mass.     Tenderness: There is no abdominal tenderness. There is no right CVA tenderness, left CVA tenderness, guarding or rebound.     Hernia: No hernia is present.  Musculoskeletal:        General: No swelling, tenderness, deformity or signs of injury. Normal range of motion.     Cervical back: Normal range of motion and neck supple. No rigidity or tenderness.     Right lower leg: No edema.     Left lower leg: No edema.     Comments:  The incision of his mid to lower back is well healed  Lymphadenopathy:     Cervical: No cervical adenopathy.     Upper Body:     Right upper body: No supraclavicular or axillary adenopathy.     Left upper body: No supraclavicular or axillary adenopathy.     Lower Body: No right inguinal adenopathy. No left inguinal adenopathy.  Skin:    General: Skin is warm and dry.     Capillary Refill: Capillary refill takes less than 2 seconds.     Coloration: Skin is not jaundiced or  pale.     Findings: No bruising, erythema, lesion or rash.  Neurological:     General: No focal deficit present.     Mental Status: He is alert and oriented to person, place, and time. Mental status is at baseline.     Cranial Nerves: No cranial nerve deficit.     Sensory: No sensory deficit.     Motor: No weakness.     Coordination: Coordination normal.     Gait: Gait normal.     Deep Tendon Reflexes: Reflexes normal.  Psychiatric:        Mood and Affect: Mood normal.        Behavior: Behavior normal.        Thought Content: Thought content normal.        Judgment: Judgment normal.    LABS:   CBC Latest Ref Rng & Units 12/10/2020 12/03/2020 11/26/2020  WBC - 11.4 17.1 8.7  Hemoglobin 13.5 -  17.5 12.2(A) 13.5 12.7(A)  Hematocrit 41 - 53 38(A) 41 39(A)  Platelets 150 - 399 175 184 147(A)   CMP Latest Ref Rng & Units 12/10/2020 12/03/2020 11/26/2020  Glucose 70 - 99 mg/dL - - -  BUN 4 - 21 30(A) 22(A) 15  Creatinine 0.6 - 1.3 0.8 0.7 0.6  Sodium 137 - 147 131(A) 132(A) 132(A)  Potassium 3.4 - 5.3 4.1 4.0 3.6  Chloride 99 - 108 100 101 98(A)  CO2 13 - 22 23(A) 24(A) 24(A)  Calcium 8.7 - 10.7 10.3 9.5 9.1  Alkaline Phos 25 - 125 59 73 73  AST 14 - 40 28 31 34  ALT 10 - 40 $Re'27 26 24     'tUP$ No results found for: CEA1 / No results found for: CEA1 No results found for: PSA1 No results found for: GPQ982 No results found for: MEB583  Lab Results  Component Value Date   TOTALPROTELP 6.3 10/24/2020   ALBUMINELP 3.8 10/24/2020   A1GS 0.3 10/24/2020   A2GS 0.7 10/24/2020   BETS 1.0 10/24/2020   GAMS 0.5 10/24/2020   MSPIKE 0.3 (H) 10/24/2020   SPEI Comment 10/24/2020   No results found for: TIBC, FERRITIN, IRONPCTSAT No results found for: LDH  STUDIES:  No results found.    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 He has never used smokeless tobacco. He reports that he does not drink alcohol and does not use drugs.The patient is alone today.  Allergies:  Allergies  Allergen Reactions   Elemental Sulfur Rash   Penicillins Rash   Seasonal Ic [Cholestatin]     Does not recall this   Clarithromycin     Can't remember reaction    Current Medications: Current Outpatient Medications  Medication Sig Dispense Refill   acyclovir (ZOVIRAX) 400 MG tablet Take 1 tablet (400 mg total) by mouth daily. 90 tablet 3   albuterol (VENTOLIN HFA) 108 (90 Base) MCG/ACT inhaler Inhale 1-2 puffs into the lungs every 6 (six) hours as needed for wheezing.     aspirin 81 MG EC tablet Take 81 mg by mouth daily.     BREO ELLIPTA 100-25 MCG/INH AEPB Inhale 1 puff into the lungs daily.     buPROPion (WELLBUTRIN XL) 150 MG 24 hr tablet Take 150 mg by mouth daily.     dexamethasone (DECADRON) 4 MG tablet Take 10 tablets (40 mg  total) by mouth once a week. 40 tablet 0   dicyclomine (BENTYL) 10 MG capsule Take 10 mg by mouth 3 (three) times daily as needed for spasms.     levofloxacin (LEVAQUIN) 500 MG tablet Take 1 tablet (500 mg total) by mouth daily. 7 tablet 0   Loperamide HCl 1 MG/7.5ML  LIQD Take 2 mg by mouth daily as needed (diarrhea or loose stools).     Magnesium 100 MG TABS Take 100 mg by mouth in the morning and at bedtime.     omeprazole (PRILOSEC OTC) 20 MG tablet Take 20 mg by mouth daily.     ondansetron (ZOFRAN) 4 MG tablet Take 1 tablet (4 mg total) by mouth every 4 (four) hours as needed for nausea. 90 tablet 3   pravastatin (PRAVACHOL) 20 MG tablet Take 20 mg by mouth daily.     prochlorperazine (COMPAZINE) 10 MG tablet Take 1 tablet (10 mg total) by mouth every 6 (six) hours as needed for nausea or vomiting. 90 tablet 3   tamsulosin (FLOMAX) 0.4 MG CAPS capsule Take 0.8 mg by mouth daily.     tiZANidine (ZANAFLEX) 4 MG tablet Take 1 tablet (4 mg total) by mouth every 6 (six) hours as needed for muscle spasms. 60 tablet 0   venlafaxine XR (EFFEXOR-XR) 150 MG 24 hr capsule Take 150 mg by mouth daily.     No current facility-administered medications for this visit.     ASSESSMENT & PLAN:   Assessment/Plan:  Andrew Espinoza is a 65 y.o. 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  This is based upon his marrow having >10% plasma cells.  Cytogenetics were normal, but his myeloma FISH panel was unable to be done due to insufficient quantity. He is receiving induction therapy with RVD (RevIimid, Velcade, Decadron).  He is also receiving Zometa with each cycle of RVD to protect his bones against potential fracture. He is tolerating treatment well. He will proceed with day 1 of cycle 2 tomorrow. He completes antibiotic therapy for sinus infection today and reports feeling much better.  We will see him back in clinic in 3 weeks prior to cycle 3 for repeat evaluation.  He verbalizes understanding of and agreement to the plans discussed today. He knows to call the office should any new questions or concerns arise.    Melodye Ped, NP

## 2020-12-11 ENCOUNTER — Inpatient Hospital Stay: Payer: BC Managed Care – PPO

## 2020-12-11 ENCOUNTER — Other Ambulatory Visit: Payer: Self-pay

## 2020-12-11 VITALS — BP 131/93 | HR 110 | Temp 98.3°F | Resp 18 | Wt 259.9 lb

## 2020-12-11 DIAGNOSIS — R739 Hyperglycemia, unspecified: Secondary | ICD-10-CM | POA: Diagnosis not present

## 2020-12-11 DIAGNOSIS — C9000 Multiple myeloma not having achieved remission: Secondary | ICD-10-CM

## 2020-12-11 DIAGNOSIS — E871 Hypo-osmolality and hyponatremia: Secondary | ICD-10-CM | POA: Diagnosis not present

## 2020-12-11 DIAGNOSIS — Z5112 Encounter for antineoplastic immunotherapy: Secondary | ICD-10-CM | POA: Diagnosis not present

## 2020-12-11 LAB — KAPPA/LAMBDA LIGHT CHAINS
Kappa free light chain: 17.1 mg/L (ref 3.3–19.4)
Kappa, lambda light chain ratio: 0.74 (ref 0.26–1.65)
Lambda free light chains: 23 mg/L (ref 5.7–26.3)

## 2020-12-11 MED ORDER — ZOLEDRONIC ACID 4 MG/100ML IV SOLN
INTRAVENOUS | Status: AC
  Filled 2020-12-11: qty 100

## 2020-12-11 MED ORDER — ZOLEDRONIC ACID 4 MG/100ML IV SOLN
4.0000 mg | Freq: Once | INTRAVENOUS | Status: AC
  Administered 2020-12-11: 4 mg via INTRAVENOUS
  Filled 2020-12-11: qty 100

## 2020-12-11 MED ORDER — SODIUM CHLORIDE 0.9 % IV SOLN
Freq: Once | INTRAVENOUS | Status: AC
  Filled 2020-12-11: qty 250

## 2020-12-11 MED ORDER — BORTEZOMIB CHEMO SQ INJECTION 3.5 MG (2.5MG/ML)
1.3000 mg/m2 | Freq: Once | INTRAMUSCULAR | Status: AC
  Administered 2020-12-11: 3 mg via SUBCUTANEOUS
  Filled 2020-12-11: qty 1.2

## 2020-12-11 NOTE — Progress Notes (Signed)
PT STABLE AT TIME OF DISCHARGE 

## 2020-12-11 NOTE — Progress Notes (Signed)
Discharged home, stable  

## 2020-12-11 NOTE — Patient Instructions (Signed)
[facility]  Discharge Instructions: Thank you for choosing [facility] to provide your oncology and hematology care.  If you have a lab appointment with the [facility], please go directly to the [facility] and check in at the registration area.   Wear comfortable clothing and clothing appropriate for easy access to any Portacath or PICC line.   We strive to give you quality time with your provider. You may need to reschedule your appointment if you arrive late (15 or more minutes).  Arriving late affects you and other patients whose appointments are after yours.  Also, if you miss three or more appointments without notifying the office, you may be dismissed from the clinic at the provider's discretion.      For prescription refill requests, have your pharmacy contact our office and allow 72 hours for refills to be completed.    Today you received the following chemotherapy and/or immunotherapy agents:Velcade and Zometa   To help prevent nausea and vomiting after your treatment, we encourage you to take your nausea medication as directed.  BELOW ARE SYMPTOMS THAT SHOULD BE REPORTED IMMEDIATELY: *FEVER GREATER THAN 100.4 F (38 C) OR HIGHER *CHILLS OR SWEATING *NAUSEA AND VOMITING THAT IS NOT CONTROLLED WITH YOUR NAUSEA MEDICATION *UNUSUAL SHORTNESS OF BREATH *UNUSUAL BRUISING OR BLEEDING *URINARY PROBLEMS (pain or burning when urinating, or frequent urination) *BOWEL PROBLEMS (unusual diarrhea, constipation, pain near the anus) TENDERNESS IN MOUTH AND THROAT WITH OR WITHOUT PRESENCE OF ULCERS (sore throat, sores in mouth, or a toothache) UNUSUAL RASH, SWELLING OR PAIN  UNUSUAL VAGINAL DISCHARGE OR ITCHING   Items with * indicate a potential emergency and should be followed up as soon as possible or go to the Emergency Department if any problems should occur.  Please show the CHEMOTHERAPY ALERT CARD or IMMUNOTHERAPY ALERT CARD at check-in to  the Emergency Department and triage nurse.  Should you have questions after your visit or need to cancel or reschedule your appointment, please contact [facility]  Dept: [phone]  and follow the prompts.  Office hours are 8:00 a.m. to 4:30 p.m. Monday - Friday. Please note that voicemails left after 4:00 p.m. may not be returned until the following business day.  We are closed weekends and major holidays. You have access to a nurse at all times for urgent questions. Please call the main number to the clinic Dept: [phone] and follow the prompts.  For any non-urgent questions, you may also contact your provider using MyChart. We now offer e-Visits for anyone 66 and older to request care online for non-urgent symptoms. For details visit [email or web address].   Also download the MyChart app! Go to the app store, search "MyChart", open the app, select [facility], and log in with your MyChart username and password.  Due to Covid, a mask is required upon entering the hospital/clinic. If you do not have a mask, one will be given to you upon arrival. For doctor visits, patients may have 1 support person aged 67 or older with them. For treatment visits, patients cannot have anyone with them due to current Covid guidelines and our immunocompromised population.   Bortezomib injection What is this medication? BORTEZOMIB (bor TEZ oh mib) targets proteins in cancer cells and stops thecancer cells from growing. It treats multiple myeloma and mantle cell lymphoma. This medicine may be used for other purposes; ask your health care provider orpharmacist if you have questions. COMMON BRAND NAME(S): Velcade What  should I tell my care team before I take this medication? They need to know if you have any of these conditions: dehydration diabetes (high blood sugar) heart disease liver disease tingling of the fingers or toes or other nerve disorder an unusual or allergic reaction  to bortezomib, mannitol, boron, other medicines, foods, dyes, or preservatives pregnant or trying to get pregnant breast-feeding How should I use this medication? This medicine is injected into a vein or under the skin. It is given by ahealth care provider in a hospital or clinic setting. Talk to your health care provider about the use of this medicine in children.Special care may be needed. Overdosage: If you think you have taken too much of this medicine contact apoison control center or emergency room at once. NOTE: This medicine is only for you. Do not share this medicine with others. What if I miss a dose? Keep appointments for follow-up doses. It is important not to miss your dose.Call your health care provider if you are unable to keep an appointment. What may interact with this medication? This medicine may interact with the following medications: ketoconazole rifampin This list may not describe all possible interactions. Give your health care provider a list of all the medicines, herbs, non-prescription drugs, or dietary supplements you use. Also tell them if you smoke, drink alcohol, or use illegaldrugs. Some items may interact with your medicine. What should I watch for while using this medication? Your condition will be monitored carefully while you are receiving thismedicine. You may need blood work done while you are taking this medicine. You may get drowsy or dizzy. Do not drive, use machinery, or do anything that needs mental alertness until you know how this medicine affects you. Do not stand up or sit up quickly, especially if you are an older patient. Thisreduces the risk of dizzy or fainting spells This medicine may increase your risk of getting an infection. Call your health care provider for advice if you get a fever, chills, sore throat, or other symptoms of a cold or flu. Do not treat yourself. Try to avoid being aroundpeople who are sick. Check with your health care  provider if you have severe diarrhea, nausea, and vomiting, or if you sweat a lot. The loss of too much body fluid may make itdangerous for you to take this medicine. Do not become pregnant while taking this medicine or for 7 months after stopping it. Women should inform their health care provider if they wish to become pregnant or think they might be pregnant. Men should not father a child while taking this medicine and for 4 months after stopping it. There is a potential for serious harm to an unborn child. Talk to your health care provider for more information. Do not breast-feed an infant while taking thismedicine or for 2 months after stopping it. This medicine may make it more difficult to get pregnant or father a child.Talk to your health care provider if you are concerned about your fertility. What side effects may I notice from receiving this medication? Side effects that you should report to your doctor or health care professionalas soon as possible: allergic reactions (skin rash; itching or hives; swelling of the face, lips, or tongue) bleeding (bloody or black, tarry stools; red or dark Mcdaniel urine; spitting up blood or Santilli material that looks like coffee grounds; red spots on the skin; unusual bruising or bleeding from the eye, gums, or nose) blurred vision or changes in vision confusion constipation headache  heart failure (trouble breathing; fast, irregular heartbeat; sudden weight gain; swelling of the ankles, feet, hands) infection (fever, chills, cough, sore throat, pain or trouble passing urine) lack or loss of appetite liver injury (dark yellow or Gramlich urine; general ill feeling or flu-like symptoms; loss of appetite, right upper belly pain; yellowing of the eyes or skin) low blood pressure (dizziness; feeling faint or lightheaded, falls; unusually weak or tired) muscle cramps pain, redness, or irritation at site where injected pain, tingling, numbness in the hands or  feet seizures trouble breathing unusual bruising or bleeding Side effects that usually do not require medical attention (report to yourdoctor or health care professional if they continue or are bothersome): diarrhea nausea stomach pain trouble sleeping vomiting This list may not describe all possible side effects. Call your doctor for medical advice about side effects. You may report side effects to FDA at1-800-FDA-1088. Where should I keep my medication? This medicine is given in a hospital or clinic. It will not be stored at home. NOTE: This sheet is a summary. It may not cover all possible information. If you have questions about this medicine, talk to your doctor, pharmacist, orhealth care provider.  2022 Elsevier/Gold Standard (2020-04-26 13:22:53) Zoledronic Acid Injection (Hypercalcemia, Oncology) What is this medication? ZOLEDRONIC ACID (ZOE le dron ik AS id) slows calcium loss from bones. It high calcium levels in the blood from some kinds of cancer. It may be used in otherpeople at risk for bone loss. This medicine may be used for other purposes; ask your health care provider orpharmacist if you have questions. COMMON BRAND NAME(S): Zometa What should I tell my care team before I take this medication? They need to know if you have any of these conditions: cancer dehydration dental disease kidney disease liver disease low levels of calcium in the blood lung or breathing disease (asthma) receiving steroids like dexamethasone or prednisone an unusual or allergic reaction to zoledronic acid, other medicines, foods, dyes, or preservatives pregnant or trying to get pregnant breast-feeding How should I use this medication? This drug is injected into a vein. It is given by a health care provider in [facility] or clinic setting. Talk to your health care provider about the use of this drug in children.Special care may be needed. Overdosage: If you think you have taken too much of  this medicine contact apoison control center or emergency room at once. NOTE: This medicine is only for you. Do not share this medicine with others. What if I miss a dose? Keep appointments for follow-up doses. It is important not to miss your dose.Call your health care provider if you are unable to keep an appointment. What may interact with this medication? certain antibiotics given by injection NSAIDs, medicines for pain and inflammation, like ibuprofen or naproxen some diuretics like bumetanide, furosemide teriparatide thalidomide This list may not describe all possible interactions. Give your health care provider a list of all the medicines, herbs, non-prescription drugs, or dietary supplements you use. Also tell them if you smoke, drink alcohol, or use illegaldrugs. Some items may interact with your medicine. What should I watch for while using this medication? Visit your health care provider for regular checks on your progress. It may besome time before you see the benefit from this drug. Some people who take this drug have severe bone, joint, or muscle pain. This drug may also increase your risk for jaw problems or a broken thigh bone. Tell your health care provider right away if you have severe  pain in your jaw, bones, joints, or muscles. Tell you health care provider if you have any painthat does not go away or that gets worse. Tell your dentist and dental surgeon that you are taking this drug. You should not have major dental surgery while on this drug. See your dentist to have a dental exam and fix any dental problems before starting this drug. Take good care of your teeth while on this drug. Make sure you see your dentist forregular follow-up appointments. You should make sure you get enough calcium and vitamin D while you are taking this drug. Discuss the foods you eat and the vitamins you take with your healthcare provider. Check with your health care provider if you have severe  diarrhea, nausea, and vomiting, or if you sweat a lot. The loss of too much body fluid may make itdangerous for you to take this drug. You may need blood work done while you are taking this drug. Do not become pregnant while taking this drug. Women should inform their health care provider if they wish to become pregnant or think they might be pregnant. There is potential for serious harm to an unborn child. Talk to your healthcare provider for more information. What side effects may I notice from receiving this medication? Side effects that you should report to your doctor or health care provider assoon as possible: allergic reactions (skin rash, itching or hives; swelling of the face, lips, or tongue) bone pain infection (fever, chills, cough, sore throat, pain or trouble passing urine) jaw pain, especially after dental work joint pain kidney injury (trouble passing urine or change in the amount of urine) low blood pressure (dizziness; feeling faint or lightheaded, falls; unusually weak or tired) low calcium levels (fast heartbeat; muscle cramps or pain; pain, tingling, or numbness in the hands or feet; seizures) low magnesium levels (fast, irregular heartbeat; muscle cramp or pain; muscle weakness; tremors; seizures) low red blood cell counts (trouble breathing; feeling faint; lightheaded, falls; unusually weak or tired) muscle pain redness, blistering, peeling, or loosening of the skin, including inside the mouth severe diarrhea swelling of the ankles, feet, hands trouble breathing Side effects that usually do not require medical attention (report to yourdoctor or health care provider if they continue or are bothersome): anxious constipation coughing depressed mood eye irritation, itching, or pain fever general ill feeling or flu-like symptoms nausea pain, redness, or irritation at site where injected trouble sleeping This list may not describe all possible side effects. Call your  doctor for medical advice about side effects. You may report side effects to FDA at1-800-FDA-1088. Where should I keep my medication? This drug is given in a hospital or clinic. It will not be stored at home. NOTE: This sheet is a summary. It may not cover all possible information. If you have questions about this medicine, talk to your doctor, pharmacist, orhealth care provider.  2022 Elsevier/Gold Standard (2019-02-17 09:13:00)

## 2020-12-12 ENCOUNTER — Encounter: Payer: Self-pay | Admitting: Oncology

## 2020-12-13 ENCOUNTER — Other Ambulatory Visit: Payer: Self-pay | Admitting: Hematology and Oncology

## 2020-12-14 LAB — MULTIPLE MYELOMA PANEL, SERUM
Albumin SerPl Elph-Mcnc: 3.4 g/dL (ref 2.9–4.4)
Albumin/Glob SerPl: 1.5 (ref 0.7–1.7)
Alpha 1: 0.2 g/dL (ref 0.0–0.4)
Alpha2 Glob SerPl Elph-Mcnc: 0.7 g/dL (ref 0.4–1.0)
B-Globulin SerPl Elph-Mcnc: 0.9 g/dL (ref 0.7–1.3)
Gamma Glob SerPl Elph-Mcnc: 0.4 g/dL (ref 0.4–1.8)
Globulin, Total: 2.3 g/dL (ref 2.2–3.9)
IgA: 161 mg/dL (ref 61–437)
IgG (Immunoglobin G), Serum: 499 mg/dL — ABNORMAL LOW (ref 603–1613)
IgM (Immunoglobulin M), Srm: 23 mg/dL (ref 20–172)
Total Protein ELP: 5.7 g/dL — ABNORMAL LOW (ref 6.0–8.5)

## 2020-12-17 ENCOUNTER — Other Ambulatory Visit: Payer: Self-pay | Admitting: Hematology and Oncology

## 2020-12-17 ENCOUNTER — Ambulatory Visit: Payer: BC Managed Care – PPO

## 2020-12-17 ENCOUNTER — Encounter: Payer: Self-pay | Admitting: Hematology and Oncology

## 2020-12-17 ENCOUNTER — Inpatient Hospital Stay: Payer: BC Managed Care – PPO | Attending: Hematology and Oncology

## 2020-12-17 ENCOUNTER — Other Ambulatory Visit: Payer: Self-pay

## 2020-12-17 DIAGNOSIS — C9030 Solitary plasmacytoma not having achieved remission: Secondary | ICD-10-CM | POA: Diagnosis not present

## 2020-12-17 DIAGNOSIS — C9000 Multiple myeloma not having achieved remission: Secondary | ICD-10-CM | POA: Diagnosis not present

## 2020-12-17 DIAGNOSIS — Z5112 Encounter for antineoplastic immunotherapy: Secondary | ICD-10-CM | POA: Insufficient documentation

## 2020-12-17 LAB — HEPATIC FUNCTION PANEL
ALT: 33 (ref 10–40)
AST: 21 (ref 14–40)
Alkaline Phosphatase: 88 (ref 25–125)
Bilirubin, Total: 0.4

## 2020-12-17 LAB — COMPREHENSIVE METABOLIC PANEL
Albumin: 4 (ref 3.5–5.0)
Calcium: 11 — AB (ref 8.7–10.7)

## 2020-12-17 LAB — CBC AND DIFFERENTIAL
HCT: 43 (ref 41–53)
Hemoglobin: 13.8 (ref 13.5–17.5)
Neutrophils Absolute: 10.87
Platelets: 184 (ref 150–399)
WBC: 13.1

## 2020-12-17 LAB — BASIC METABOLIC PANEL
BUN: 12 (ref 4–21)
CO2: 26 — AB (ref 13–22)
Chloride: 95 — AB (ref 99–108)
Creatinine: 0.8 (ref 0.6–1.3)
Glucose: 483
Potassium: 3.7 (ref 3.4–5.3)
Sodium: 131 — AB (ref 137–147)

## 2020-12-17 LAB — CBC: RBC: 4.86 (ref 3.87–5.11)

## 2020-12-17 MED ORDER — DEXAMETHASONE 4 MG PO TABS: 40.0000 mg | ORAL_TABLET | ORAL | 0 refills | Status: DC

## 2020-12-17 NOTE — Progress Notes (Signed)
[facility]  [address] [place],  [place]  [address] [phone]  Clinic Day:  12/31/2020  Referring physician: Angelina Sheriff, MD  This document serves as a record of services personally performed by Calissa Swenor Macarthur Critchley, MD. It was created on their behalf by [facility], a trained medical scribe. The creation of this record is based on the scribe's personal observations and the provider's statements to them.  HISTORY OF PRESENT ILLNESS:  Andrew Espinoza is a 65 y.o. 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  This is based upon his marrow having >10% plasma cells.  Cytogenetics was normal, but the myeloma FISH panel could not be done due to insufficient quantity.  He comes in today to be evaluated before heading into his 3rd cycle of  induction RVD (RevIimid, Velcade, Decadron).   He did have problems with his 2nd cycle of RVD, which included mouth sores, altered taste and failure to thrive.  He requests that his Revlimid dose be decreased.   VITALS:  Blood pressure (!) 136/92, pulse 77, temperature 98.1 F (36.7 C), temperature source Oral, resp. rate 16, height $RemoveBe'5\' 10"'hhBbJNVov$  (1.778 m), weight 244 lb 12.8 oz (111 kg), SpO2 97 %.  Wt Readings from Last 3 Encounters:  12/31/20 244 lb 12.8 oz (111 kg)  12/25/20 258 lb (117 kg)  12/11/20 259 lb 14.4 oz (117.9 kg)    Body mass index is 35.13 kg/m.  Performance status (ECOG): 1 - Symptomatic but completely ambulatory  PHYSICAL EXAM:  Physical Exam Constitutional:      General: He is not in acute distress.    Appearance: Normal appearance. He is normal weight.  HENT:     Head: Normocephalic and atraumatic.  Eyes:     General: No scleral icterus.    Extraocular Movements: Extraocular movements intact.     Conjunctiva/sclera: Conjunctivae normal.     Pupils: Pupils are equal, round, and reactive to light.  Cardiovascular:     Rate and Rhythm: Normal rate and regular rhythm.      Pulses: Normal pulses.     Heart sounds: Normal heart sounds. No murmur heard.   No friction rub. No gallop.  Pulmonary:     Effort: Pulmonary effort is normal. No respiratory distress.     Breath sounds: Normal breath sounds.  Abdominal:     General: Bowel sounds are normal. There is no distension.     Palpations: Abdomen is soft. There is no hepatomegaly, splenomegaly or mass.     Tenderness: There is no abdominal tenderness.  Musculoskeletal:        General: Normal range of motion.     Cervical back: Normal range of motion and neck supple.     Right lower leg: No edema.     Left lower leg: No edema.  Lymphadenopathy:     Cervical: No cervical adenopathy.  Skin:    General: Skin is warm and dry.  Neurological:     General: No focal deficit present.     Mental Status: He is alert and oriented to person, place, and time. Mental status is at baseline.  Psychiatric:        Mood and Affect: Mood normal.        Behavior: Behavior normal.        Thought Content: Thought content normal.        Judgment: Judgment normal.    LABS:   CBC Latest Ref  Rng & Units 12/31/2020 12/24/2020 12/17/2020  WBC - 11.8 16.4 13.1  Hemoglobin 13.5 - 17.5 12.5(A) 14.7 13.8  Hematocrit 41 - 53 39(A) 46 43  Platelets 150 - 399 203 172 184   CMP Latest Ref Rng & Units 12/31/2020 12/24/2020 12/17/2020  Glucose 70 - 99 mg/dL - - -  BUN 4 - $R'21 19 15 12  'kJ$ Creatinine 0.6 - 1.3 1.0 0.9 0.8  Sodium 137 - 147 135(A) 133(A) 131(A)  Potassium 3.4 - 5.3 4.6 2.9(A) 3.7  Chloride 99 - 108 101 96(A) 95(A)  CO2 13 - 22 28(A) 25(A) 26(A)  Calcium 8.7 - 10.7 9.4 7.8(A) 11.0(A)  Alkaline Phos 25 - 125 67 73 88  AST 14 - 40 32 31 21  ALT 10 - 40 32 32 33    Ref. Range 12/10/2020 10:18  M Protein SerPl Elph-Mcnc Latest Ref Range: Not Observed g/dL Not Observed     ASSESSMENT & PLAN:  Assessment/Plan:  A 65 y.o. 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  Recent lambda light chain levels show a significant  reduction after just 2 cycles of induction RVD.  He will proceed with his 3rd cycle of RVD this week.  However, his Revlimid will be cut to 15 mg daily, every 2/3 weeks.  Per his oral exam, I saw no evidence of mucositis.  However, he does appear to have thrush in his oropharynx for which he will be prescribed fluconazole 150 mg daily x 5 days.  He will continue to receive Zometa with each cycle of RVD to protect his bones against potential fractures.  Otherwise, I will see him back in clinic in 3 weeks before he heads into his 4th and final cycle of RVD.  The patient understands all the plans discussed today and is in agreement with them.     I, Rita Ohara, am acting as scribe for Marice Potter, MD    I have reviewed this report as typed by the medical scribe, and it is complete and accurate.  Latisha Lasch Macarthur Critchley, MD

## 2020-12-18 ENCOUNTER — Other Ambulatory Visit: Payer: Self-pay | Admitting: Pharmacist

## 2020-12-18 ENCOUNTER — Other Ambulatory Visit: Payer: Self-pay

## 2020-12-18 ENCOUNTER — Encounter: Payer: Self-pay | Admitting: Oncology

## 2020-12-18 ENCOUNTER — Inpatient Hospital Stay: Payer: BC Managed Care – PPO

## 2020-12-18 ENCOUNTER — Telehealth: Payer: Self-pay

## 2020-12-18 DIAGNOSIS — Z5112 Encounter for antineoplastic immunotherapy: Secondary | ICD-10-CM | POA: Diagnosis not present

## 2020-12-18 DIAGNOSIS — Z8579 Personal history of other malignant neoplasms of lymphoid, hematopoietic and related tissues: Secondary | ICD-10-CM | POA: Diagnosis not present

## 2020-12-18 DIAGNOSIS — E1165 Type 2 diabetes mellitus with hyperglycemia: Secondary | ICD-10-CM | POA: Diagnosis not present

## 2020-12-18 DIAGNOSIS — C9000 Multiple myeloma not having achieved remission: Secondary | ICD-10-CM

## 2020-12-18 DIAGNOSIS — Z6837 Body mass index (BMI) 37.0-37.9, adult: Secondary | ICD-10-CM | POA: Diagnosis not present

## 2020-12-18 MED ORDER — BORTEZOMIB CHEMO SQ INJECTION 3.5 MG (2.5MG/ML)
1.3000 mg/m2 | Freq: Once | INTRAMUSCULAR | Status: AC
  Administered 2020-12-18: 3 mg via SUBCUTANEOUS
  Filled 2020-12-18: qty 1.2

## 2020-12-18 MED ORDER — SODIUM CHLORIDE 0.9 % IV SOLN
Freq: Once | INTRAVENOUS | Status: AC
  Filled 2020-12-18: qty 250

## 2020-12-18 NOTE — Telephone Encounter (Signed)
I spoke with pt on the phone, while he was in the infusion center. He is still taking his Revlimid every morning with breakfast. Denies missed doses. He saw Melissa,NP, yesterday and she prescribed him something to take for mouth sores. Pt states he has sore on the left side of tongue, and then a smaller on on the right sore of tongue. He states his throat is a little sore too. Pt denies N/V, skin reactions, & diarrhea. I reminded pt to call us if temp is 100.4 or higher, DAY OR NIGHT. Pt verbalized understanding.

## 2020-12-18 NOTE — Patient Instructions (Signed)
Bortezomib injection What is this medication? BORTEZOMIB (bor TEZ oh mib) targets proteins in cancer cells and stops thecancer cells from growing. It treats multiple myeloma and mantle cell lymphoma. This medicine may be used for other purposes; ask your health care provider orpharmacist if you have questions. COMMON BRAND NAME(S): Velcade What should I tell my care team before I take this medication? They need to know if you have any of these conditions: dehydration diabetes (high blood sugar) heart disease liver disease tingling of the fingers or toes or other nerve disorder an unusual or allergic reaction to bortezomib, mannitol, boron, other medicines, foods, dyes, or preservatives pregnant or trying to get pregnant breast-feeding How should I use this medication? This medicine is injected into a vein or under the skin. It is given by ahealth care provider in a hospital or clinic setting. Talk to your health care provider about the use of this medicine in children.Special care may be needed. Overdosage: If you think you have taken too much of this medicine contact apoison control center or emergency room at once. NOTE: This medicine is only for you. Do not share this medicine with others. What if I miss a dose? Keep appointments for follow-up doses. It is important not to miss your dose.Call your health care provider if you are unable to keep an appointment. What may interact with this medication? This medicine may interact with the following medications: ketoconazole rifampin This list may not describe all possible interactions. Give your health care provider a list of all the medicines, herbs, non-prescription drugs, or dietary supplements you use. Also tell them if you smoke, drink alcohol, or use illegaldrugs. Some items may interact with your medicine. What should I watch for while using this medication? Your condition will be monitored carefully while you are receiving  thismedicine. You may need blood work done while you are taking this medicine. You may get drowsy or dizzy. Do not drive, use machinery, or do anything that needs mental alertness until you know how this medicine affects you. Do not stand up or sit up quickly, especially if you are an older patient. Thisreduces the risk of dizzy or fainting spells This medicine may increase your risk of getting an infection. Call your health care provider for advice if you get a fever, chills, sore throat, or other symptoms of a cold or flu. Do not treat yourself. Try to avoid being aroundpeople who are sick. Check with your health care provider if you have severe diarrhea, nausea, and vomiting, or if you sweat a lot. The loss of too much body fluid may make itdangerous for you to take this medicine. Do not become pregnant while taking this medicine or for 7 months after stopping it. Women should inform their health care provider if they wish to become pregnant or think they might be pregnant. Men should not father a child while taking this medicine and for 4 months after stopping it. There is a potential for serious harm to an unborn child. Talk to your health care provider for more information. Do not breast-feed an infant while taking thismedicine or for 2 months after stopping it. This medicine may make it more difficult to get pregnant or father a child.Talk to your health care provider if you are concerned about your fertility. What side effects may I notice from receiving this medication? Side effects that you should report to your doctor or health care professionalas soon as possible: allergic reactions (skin rash; itching or hives; swelling   of the face, lips, or tongue) bleeding (bloody or black, tarry stools; red or dark Aldava urine; spitting up blood or Gilden material that looks like coffee grounds; red spots on the skin; unusual bruising or bleeding from the eye, gums, or nose) blurred vision or changes in  vision confusion constipation headache heart failure (trouble breathing; fast, irregular heartbeat; sudden weight gain; swelling of the ankles, feet, hands) infection (fever, chills, cough, sore throat, pain or trouble passing urine) lack or loss of appetite liver injury (dark yellow or Lacasse urine; general ill feeling or flu-like symptoms; loss of appetite, right upper belly pain; yellowing of the eyes or skin) low blood pressure (dizziness; feeling faint or lightheaded, falls; unusually weak or tired) muscle cramps pain, redness, or irritation at site where injected pain, tingling, numbness in the hands or feet seizures trouble breathing unusual bruising or bleeding Side effects that usually do not require medical attention (report to yourdoctor or health care professional if they continue or are bothersome): diarrhea nausea stomach pain trouble sleeping vomiting This list may not describe all possible side effects. Call your doctor for medical advice about side effects. You may report side effects to FDA at1-800-FDA-1088. Where should I keep my medication? This medicine is given in a hospital or clinic. It will not be stored at home. NOTE: This sheet is a summary. It may not cover all possible information. If you have questions about this medicine, talk to your doctor, pharmacist, orhealth care provider.  2022 Elsevier/Gold Standard (2020-04-26 13:22:53)  

## 2020-12-19 ENCOUNTER — Encounter: Payer: Self-pay | Admitting: Oncology

## 2020-12-19 DIAGNOSIS — E662 Morbid (severe) obesity with alveolar hypoventilation: Secondary | ICD-10-CM | POA: Diagnosis not present

## 2020-12-19 DIAGNOSIS — R5383 Other fatigue: Secondary | ICD-10-CM | POA: Diagnosis not present

## 2020-12-19 DIAGNOSIS — J4540 Moderate persistent asthma, uncomplicated: Secondary | ICD-10-CM | POA: Diagnosis not present

## 2020-12-19 DIAGNOSIS — G4733 Obstructive sleep apnea (adult) (pediatric): Secondary | ICD-10-CM | POA: Diagnosis not present

## 2020-12-24 ENCOUNTER — Other Ambulatory Visit: Payer: Self-pay | Admitting: Hematology and Oncology

## 2020-12-24 ENCOUNTER — Inpatient Hospital Stay: Payer: BC Managed Care – PPO

## 2020-12-24 ENCOUNTER — Other Ambulatory Visit: Payer: Self-pay | Admitting: Pharmacist

## 2020-12-24 DIAGNOSIS — C9000 Multiple myeloma not having achieved remission: Secondary | ICD-10-CM

## 2020-12-24 DIAGNOSIS — C9030 Solitary plasmacytoma not having achieved remission: Secondary | ICD-10-CM | POA: Diagnosis not present

## 2020-12-24 LAB — BASIC METABOLIC PANEL
BUN: 15 (ref 4–21)
CO2: 25 — AB (ref 13–22)
Chloride: 96 — AB (ref 99–108)
Creatinine: 0.9 (ref 0.6–1.3)
Glucose: 224
Potassium: 2.9 — AB (ref 3.4–5.3)
Sodium: 133 — AB (ref 137–147)

## 2020-12-24 LAB — CBC AND DIFFERENTIAL
HCT: 46 (ref 41–53)
Hemoglobin: 14.7 (ref 13.5–17.5)
Neutrophils Absolute: 12.79
Platelets: 172 (ref 150–399)
WBC: 16.4

## 2020-12-24 LAB — HEPATIC FUNCTION PANEL
ALT: 32 (ref 10–40)
AST: 31 (ref 14–40)
Alkaline Phosphatase: 73 (ref 25–125)
Bilirubin, Total: 0.4

## 2020-12-24 LAB — CBC: RBC: 5.15 — AB (ref 3.87–5.11)

## 2020-12-24 LAB — COMPREHENSIVE METABOLIC PANEL
Albumin: 4 (ref 3.5–5.0)
Calcium: 7.8 — AB (ref 8.7–10.7)

## 2020-12-24 MED ORDER — POTASSIUM CHLORIDE CRYS ER 20 MEQ PO TBCR: 20.0000 meq | EXTENDED_RELEASE_TABLET | Freq: Two times a day (BID) | ORAL | 3 refills | Status: DC

## 2020-12-25 ENCOUNTER — Inpatient Hospital Stay: Payer: BC Managed Care – PPO

## 2020-12-25 ENCOUNTER — Other Ambulatory Visit: Payer: Self-pay

## 2020-12-25 ENCOUNTER — Telehealth: Payer: Self-pay

## 2020-12-25 VITALS — BP 131/73 | HR 99 | Temp 98.3°F | Resp 18 | Ht 70.0 in | Wt 258.0 lb

## 2020-12-25 DIAGNOSIS — C9000 Multiple myeloma not having achieved remission: Secondary | ICD-10-CM

## 2020-12-25 DIAGNOSIS — Z5112 Encounter for antineoplastic immunotherapy: Secondary | ICD-10-CM | POA: Diagnosis not present

## 2020-12-25 MED ORDER — BORTEZOMIB CHEMO SQ INJECTION 3.5 MG (2.5MG/ML)
1.3000 mg/m2 | Freq: Once | INTRAMUSCULAR | Status: AC
  Administered 2020-12-25: 3 mg via SUBCUTANEOUS
  Filled 2020-12-25: qty 1.2

## 2020-12-25 NOTE — Patient Instructions (Signed)
Bortezomib injection What is this medication? BORTEZOMIB (bor TEZ oh mib) targets proteins in cancer cells and stops thecancer cells from growing. It treats multiple myeloma and mantle cell lymphoma. This medicine may be used for other purposes; ask your health care provider orpharmacist if you have questions. COMMON BRAND NAME(S): Velcade What should I tell my care team before I take this medication? They need to know if you have any of these conditions: dehydration diabetes (high blood sugar) heart disease liver disease tingling of the fingers or toes or other nerve disorder an unusual or allergic reaction to bortezomib, mannitol, boron, other medicines, foods, dyes, or preservatives pregnant or trying to get pregnant breast-feeding How should I use this medication? This medicine is injected into a vein or under the skin. It is given by ahealth care provider in a hospital or clinic setting. Talk to your health care provider about the use of this medicine in children.Special care may be needed. Overdosage: If you think you have taken too much of this medicine contact apoison control center or emergency room at once. NOTE: This medicine is only for you. Do not share this medicine with others. What if I miss a dose? Keep appointments for follow-up doses. It is important not to miss your dose.Call your health care provider if you are unable to keep an appointment. What may interact with this medication? This medicine may interact with the following medications: ketoconazole rifampin This list may not describe all possible interactions. Give your health care provider a list of all the medicines, herbs, non-prescription drugs, or dietary supplements you use. Also tell them if you smoke, drink alcohol, or use illegaldrugs. Some items may interact with your medicine. What should I watch for while using this medication? Your condition will be monitored carefully while you are receiving  thismedicine. You may need blood work done while you are taking this medicine. You may get drowsy or dizzy. Do not drive, use machinery, or do anything that needs mental alertness until you know how this medicine affects you. Do not stand up or sit up quickly, especially if you are an older patient. Thisreduces the risk of dizzy or fainting spells This medicine may increase your risk of getting an infection. Call your health care provider for advice if you get a fever, chills, sore throat, or other symptoms of a cold or flu. Do not treat yourself. Try to avoid being aroundpeople who are sick. Check with your health care provider if you have severe diarrhea, nausea, and vomiting, or if you sweat a lot. The loss of too much body fluid may make itdangerous for you to take this medicine. Do not become pregnant while taking this medicine or for 7 months after stopping it. Women should inform their health care provider if they wish to become pregnant or think they might be pregnant. Men should not father a child while taking this medicine and for 4 months after stopping it. There is a potential for serious harm to an unborn child. Talk to your health care provider for more information. Do not breast-feed an infant while taking thismedicine or for 2 months after stopping it. This medicine may make it more difficult to get pregnant or father a child.Talk to your health care provider if you are concerned about your fertility. What side effects may I notice from receiving this medication? Side effects that you should report to your doctor or health care professionalas soon as possible: allergic reactions (skin rash; itching or hives; swelling   of the face, lips, or tongue) bleeding (bloody or black, tarry stools; red or dark Pietro urine; spitting up blood or Overfelt material that looks like coffee grounds; red spots on the skin; unusual bruising or bleeding from the eye, gums, or nose) blurred vision or changes in  vision confusion constipation headache heart failure (trouble breathing; fast, irregular heartbeat; sudden weight gain; swelling of the ankles, feet, hands) infection (fever, chills, cough, sore throat, pain or trouble passing urine) lack or loss of appetite liver injury (dark yellow or Cammarata urine; general ill feeling or flu-like symptoms; loss of appetite, right upper belly pain; yellowing of the eyes or skin) low blood pressure (dizziness; feeling faint or lightheaded, falls; unusually weak or tired) muscle cramps pain, redness, or irritation at site where injected pain, tingling, numbness in the hands or feet seizures trouble breathing unusual bruising or bleeding Side effects that usually do not require medical attention (report to yourdoctor or health care professional if they continue or are bothersome): diarrhea nausea stomach pain trouble sleeping vomiting This list may not describe all possible side effects. Call your doctor for medical advice about side effects. You may report side effects to FDA at1-800-FDA-1088. Where should I keep my medication? This medicine is given in a hospital or clinic. It will not be stored at home. NOTE: This sheet is a summary. It may not cover all possible information. If you have questions about this medicine, talk to your doctor, pharmacist, orhealth care provider.  2022 Elsevier/Gold Standard (2020-04-26 13:22:53)  

## 2020-12-25 NOTE — Telephone Encounter (Addendum)
12/31/20- Pt saw Dr Bobby Rumpf in clinic on 12/31/20 and decided to decrease his Revlimid to '15mg'$  po qd x 14d, off 7 days.   Pt called to ask Dr Bobby Rumpf to at least decrease his Revlimid prescription. Pt states, "I'm constantly having trouble with stomach & rectal spasms. Sometimes, the spasms are severe and it takes 30 minutes to an hour to get over them. It's messing with my quality of life. I'd like for him to do it today before they send me out more medicine". He states he spoke with Manuela Schwartz, pharmacist, yesterday about this. He also mentioned that his PCP started him on glimepiride about a week ago. He thinks that has made things worse too. He goes from constipation to diarrhea, but the spasms are his biggest complaint.

## 2020-12-26 ENCOUNTER — Encounter: Payer: Self-pay | Admitting: Oncology

## 2020-12-31 ENCOUNTER — Other Ambulatory Visit: Payer: Self-pay | Admitting: Oncology

## 2020-12-31 ENCOUNTER — Inpatient Hospital Stay (INDEPENDENT_AMBULATORY_CARE_PROVIDER_SITE_OTHER): Payer: BC Managed Care – PPO | Admitting: Oncology

## 2020-12-31 ENCOUNTER — Other Ambulatory Visit: Payer: Self-pay | Admitting: Hematology and Oncology

## 2020-12-31 ENCOUNTER — Inpatient Hospital Stay: Payer: BC Managed Care – PPO

## 2020-12-31 ENCOUNTER — Encounter: Payer: Self-pay | Admitting: Oncology

## 2020-12-31 VITALS — BP 136/92 | HR 77 | Temp 98.1°F | Resp 16 | Ht 70.0 in | Wt 244.8 lb

## 2020-12-31 DIAGNOSIS — C9000 Multiple myeloma not having achieved remission: Secondary | ICD-10-CM

## 2020-12-31 DIAGNOSIS — C9030 Solitary plasmacytoma not having achieved remission: Secondary | ICD-10-CM | POA: Diagnosis not present

## 2020-12-31 DIAGNOSIS — E876 Hypokalemia: Secondary | ICD-10-CM

## 2020-12-31 LAB — COMPREHENSIVE METABOLIC PANEL
Albumin: 3.7 (ref 3.5–5.0)
Calcium: 9.4 (ref 8.7–10.7)

## 2020-12-31 LAB — BASIC METABOLIC PANEL
BUN: 19 (ref 4–21)
CO2: 28 — AB (ref 13–22)
Chloride: 101 (ref 99–108)
Creatinine: 1 (ref 0.6–1.3)
Glucose: 141
Potassium: 4.6 (ref 3.4–5.3)
Sodium: 135 — AB (ref 137–147)

## 2020-12-31 LAB — HEPATIC FUNCTION PANEL
ALT: 32 (ref 10–40)
AST: 32 (ref 14–40)
Alkaline Phosphatase: 67 (ref 25–125)
Bilirubin, Total: 0.2

## 2020-12-31 LAB — CBC AND DIFFERENTIAL
HCT: 39 — AB (ref 41–53)
Hemoglobin: 12.5 — AB (ref 13.5–17.5)
Neutrophils Absolute: 9.79
Platelets: 203 (ref 150–399)
WBC: 11.8

## 2020-12-31 LAB — CBC: RBC: 4.39 (ref 3.87–5.11)

## 2020-12-31 MED ORDER — FLUCONAZOLE 150 MG PO TABS: 150.0000 mg | ORAL_TABLET | Freq: Every day | ORAL | 0 refills | Status: DC

## 2020-12-31 MED FILL — Bortezomib For Inj 3.5 MG: INTRAMUSCULAR | Qty: 1.2 | Status: AC

## 2021-01-01 ENCOUNTER — Other Ambulatory Visit: Payer: Self-pay

## 2021-01-01 ENCOUNTER — Encounter: Payer: Self-pay | Admitting: Oncology

## 2021-01-01 ENCOUNTER — Inpatient Hospital Stay: Payer: BC Managed Care – PPO

## 2021-01-01 ENCOUNTER — Other Ambulatory Visit: Payer: Self-pay | Admitting: Hematology and Oncology

## 2021-01-01 VITALS — BP 133/69 | HR 94 | Temp 98.2°F | Resp 18 | Ht 70.0 in | Wt 245.0 lb

## 2021-01-01 DIAGNOSIS — C9000 Multiple myeloma not having achieved remission: Secondary | ICD-10-CM | POA: Diagnosis not present

## 2021-01-01 DIAGNOSIS — E876 Hypokalemia: Secondary | ICD-10-CM

## 2021-01-01 DIAGNOSIS — Z5112 Encounter for antineoplastic immunotherapy: Secondary | ICD-10-CM | POA: Diagnosis not present

## 2021-01-01 MED ORDER — SODIUM CHLORIDE 0.9 % IV SOLN: Freq: Once | INTRAVENOUS | Status: AC

## 2021-01-01 MED ORDER — BORTEZOMIB CHEMO SQ INJECTION 3.5 MG (2.5MG/ML)
1.3000 mg/m2 | Freq: Once | INTRAMUSCULAR | Status: AC
  Administered 2021-01-01: 3 mg via SUBCUTANEOUS
  Filled 2021-01-01: qty 1.2

## 2021-01-01 MED ORDER — POTASSIUM CHLORIDE CRYS ER 20 MEQ PO TBCR
20.0000 meq | EXTENDED_RELEASE_TABLET | Freq: Two times a day (BID) | ORAL | 3 refills | Status: DC
Start: 2021-01-01 — End: 2021-05-29

## 2021-01-01 MED ORDER — ZOLEDRONIC ACID 4 MG/100ML IV SOLN
4.0000 mg | Freq: Once | INTRAVENOUS | Status: AC
  Administered 2021-01-01: 4 mg via INTRAVENOUS
  Filled 2021-01-01: qty 100

## 2021-01-01 NOTE — Progress Notes (Signed)
1157: PT STABLE AT TIME OF DISCHARGE

## 2021-01-01 NOTE — Patient Instructions (Signed)
Andrew Espinoza  Discharge Instructions: Thank you for choosing [facility] to provide your oncology and hematology care.  If you have a lab appointment with the [facility], please go directly to the [facility] and check in at the registration area.   Wear comfortable clothing and clothing appropriate for easy access to any Portacath or PICC line.   We strive to give you quality time with your provider. You may need to reschedule your appointment if you arrive late (15 or more minutes).  Arriving late affects you and other patients whose appointments are after yours.  Also, if you miss three or more appointments without notifying the office, you may be dismissed from the clinic at the provider's discretion.      For prescription refill requests, have your pharmacy contact our office and allow 72 hours for refills to be completed.    Today you received the following chemotherapy and/or immunotherapy agents Bortezumib    To help prevent nausea and vomiting after your treatment, we encourage you to take your nausea medication as directed.  BELOW ARE SYMPTOMS THAT SHOULD BE REPORTED IMMEDIATELY: *FEVER GREATER THAN 100.4 F (38 C) OR HIGHER *CHILLS OR SWEATING *NAUSEA AND VOMITING THAT IS NOT CONTROLLED WITH YOUR NAUSEA MEDICATION *UNUSUAL SHORTNESS OF BREATH *UNUSUAL BRUISING OR BLEEDING *URINARY PROBLEMS (pain or burning when urinating, or frequent urination) *BOWEL PROBLEMS (unusual diarrhea, constipation, pain near the anus) TENDERNESS IN MOUTH AND THROAT WITH OR WITHOUT PRESENCE OF ULCERS (sore throat, sores in mouth, or a toothache) UNUSUAL RASH, SWELLING OR PAIN  UNUSUAL VAGINAL DISCHARGE OR ITCHING   Items with * indicate a potential emergency and should be followed up as soon as possible or go to the Emergency Department if any problems should occur.  Please show the CHEMOTHERAPY ALERT CARD or IMMUNOTHERAPY ALERT CARD at check-in to the  Emergency Department and triage nurse.  Should you have questions after your visit or need to cancel or reschedule your appointment, please contact [facility]  Dept: [phone]  and follow the prompts.  Office hours are 8:00 a.m. to 4:30 p.m. Monday - Friday. Please note that voicemails left after 4:00 p.m. may not be returned until the following business day.  We are closed weekends and major holidays. You have access to a nurse at all times for urgent questions. Please call the main number to the clinic Dept: [phone] and follow the prompts.  For any non-urgent questions, you may also contact your provider using MyChart. We now offer e-Visits for anyone 38 and older to request care online for non-urgent symptoms. For details visit [email or web address].   Also download the MyChart app! Go to the app store, search "MyChart", open the app, select [facility], and log in with your MyChart username and password.  Due to Covid, a mask is required upon entering the hospital/clinic. If you do not have a mask, one will be given to you upon arrival. For doctor visits, patients may have 1 support person aged 73 or older with them. For treatment visits, patients cannot have anyone with them due to current Covid guidelines and our immunocompromised population.   Zoledronic Acid Injection (Hypercalcemia, Oncology) What is this medication? ZOLEDRONIC ACID (ZOE le dron ik AS id) slows calcium loss from bones. It high calcium levels in the blood from some kinds of cancer. It may be used in otherpeople at risk for bone loss. This medicine may be used for other purposes;  ask your health care provider orpharmacist if you have questions. COMMON BRAND NAME(S): Zometa What should I tell my care team before I take this medication? They need to know if you have any of these conditions: cancer dehydration dental disease kidney disease liver disease low levels of calcium in the  blood lung or breathing disease (asthma) receiving steroids like dexamethasone or prednisone an unusual or allergic reaction to zoledronic acid, other medicines, foods, dyes, or preservatives pregnant or trying to get pregnant breast-feeding How should I use this medication? This drug is injected into a vein. It is given by a health care provider in [place] or clinic setting. Talk to your health care provider about the use of this drug in children.Special care may be needed. Overdosage: If you think you have taken too much of this medicine contact apoison control center or emergency room at once. NOTE: This medicine is only for you. Do not share this medicine with others. What if I miss a dose? Keep appointments for follow-up doses. It is important not to miss your dose.Call your health care provider if you are unable to keep an appointment. What may interact with this medication? certain antibiotics given by injection NSAIDs, medicines for pain and inflammation, like ibuprofen or naproxen some diuretics like bumetanide, furosemide teriparatide thalidomide This list may not describe all possible interactions. Give your health care provider a list of all the medicines, herbs, non-prescription drugs, or dietary supplements you use. Also tell them if you smoke, drink alcohol, or use illegaldrugs. Some items may interact with your medicine. What should I watch for while using this medication? Visit your health care provider for regular checks on your progress. It may besome time before you see the benefit from this drug. Some people who take this drug have severe bone, joint, or muscle pain. This drug may also increase your risk for jaw problems or a broken thigh bone. Tell your health care provider right away if you have severe pain in your jaw, bones, joints, or muscles. Tell you health care provider if you have any painthat does not go away or that gets worse. Tell your dentist and dental  surgeon that you are taking this drug. You should not have major dental surgery while on this drug. See your dentist to have a dental exam and fix any dental problems before starting this drug. Take good care of your teeth while on this drug. Make sure you see your dentist forregular follow-up appointments. You should make sure you get enough calcium and vitamin D while you are taking this drug. Discuss the foods you eat and the vitamins you take with your healthcare provider. Check with your health care provider if you have severe diarrhea, nausea, and vomiting, or if you sweat a lot. The loss of too much body fluid may make itdangerous for you to take this drug. You may need blood work done while you are taking this drug. Do not become pregnant while taking this drug. Women should inform their health care provider if they wish to become pregnant or think they might be pregnant. There is potential for serious harm to an unborn child. Talk to your healthcare provider for more information. What side effects may I notice from receiving this medication? Side effects that you should report to your doctor or health care provider assoon as possible: allergic reactions (skin rash, itching or hives; swelling of the face, lips, or tongue) bone pain infection (fever, chills, cough, sore throat, pain or trouble  passing urine) jaw pain, especially after dental work joint pain kidney injury (trouble passing urine or change in the amount of urine) low blood pressure (dizziness; feeling faint or lightheaded, falls; unusually weak or tired) low calcium levels (fast heartbeat; muscle cramps or pain; pain, tingling, or numbness in the hands or feet; seizures) low magnesium levels (fast, irregular heartbeat; muscle cramp or pain; muscle weakness; tremors; seizures) low red blood cell counts (trouble breathing; feeling faint; lightheaded, falls; unusually weak or tired) muscle pain redness, blistering, peeling, or  loosening of the skin, including inside the mouth severe diarrhea swelling of the ankles, feet, hands trouble breathing Side effects that usually do not require medical attention (report to yourdoctor or health care provider if they continue or are bothersome): anxious constipation coughing depressed mood eye irritation, itching, or pain fever general ill feeling or flu-like symptoms nausea pain, redness, or irritation at site where injected trouble sleeping This list may not describe all possible side effects. Call your doctor for medical advice about side effects. You may report side effects to FDA at1-800-FDA-1088. Where should I keep my medication? This drug is given in a hospital or clinic. It will not be stored at home. NOTE: This sheet is a summary. It may not cover all possible information. If you have questions about this medicine, talk to your doctor, pharmacist, orhealth care provider.  2022 Elsevier/Gold Standard (2019-02-17 09:13:00)

## 2021-01-02 ENCOUNTER — Encounter: Payer: Self-pay | Admitting: Oncology

## 2021-01-02 ENCOUNTER — Other Ambulatory Visit: Payer: Self-pay | Admitting: Hematology and Oncology

## 2021-01-02 ENCOUNTER — Telehealth: Payer: Self-pay

## 2021-01-02 DIAGNOSIS — B370 Candidal stomatitis: Secondary | ICD-10-CM | POA: Diagnosis not present

## 2021-01-02 DIAGNOSIS — Z6836 Body mass index (BMI) 36.0-36.9, adult: Secondary | ICD-10-CM | POA: Diagnosis not present

## 2021-01-02 DIAGNOSIS — K1379 Other lesions of oral mucosa: Secondary | ICD-10-CM | POA: Diagnosis not present

## 2021-01-02 MED ORDER — HYDROCODONE-ACETAMINOPHEN 10-325 MG PO TABS: 1.0000 | ORAL_TABLET | ORAL | 0 refills | Status: DC | PRN

## 2021-01-02 NOTE — Telephone Encounter (Addendum)
8/19 - Pt called requesting more fluconazole to take until his mouth is better. I sent call to [facility], to  address with Dr Bobby Rumpf.   01/03/21 Andrew Gatchel,RN -  Pt called this morning to let us know the pain medication has helped. He is alternating mouth rinses. He reports that he does have some swelling at jaw line, which he is applying ice. He wants to know how long this will last    01/02/21 Per Manuela Schwartz Phy:  We gave him zometa yesterday and that is likely the cause of the jaw pain.  Per lexicomp, zometa can cause jaw pain (2 to 4%) and sore throat (8%).  Maybe Dr. Bobby Rumpf would consider spacing the zometa out to every 3 months for him.  He definitely needs to see a dentist is he hasn't seen one lately.  He could have an abscess tooth brewing or something.  Please advise him not to give up on the other meds.  Another thought is to decrease the dex dose down to 20 mg weekly (I think he is currently on 40 mg).      Pt reports that his left jaw is killing him. The whole pill of hydrocodone 7.5/325 isn't helping at all. I've taken oxycodone in the past and it just doesn't work for me. My throat is sore, and feels rough on the surface. Pt has taken 2 tablets of the diflucan '150mg'$  (Dr Bobby Rumpf ordered diflucan '150mg'$  po qd for 5 days on 12/31/20). He states the mouthrinse Melissa sent in hasn't helped. He went to his PCP this morning to get a second opinion for the jaw and mouth/throat pain. The PCP encouraged him to complete the diflucan as ordered and sent in a different rinse. Pt states, "I think I'm just stopping chemo for now. It's killing me". Pt is to start his Revlimid and steroids tonight. Dr Bobby Rumpf did decrease the dose of Revlimid, but pt isn't going to take it for now. I notified Melissa P,NP, & Dr Bobby Rumpf of above. Dr Bobby Rumpf states he doesn't know of anything else to do, other than giving the medication time to work. He asked me to ask pt if he has seen the dentist lately? Pt states he saw the dentist earlier in  the year, but he didn't have any problems. Pt is edentulous on upper. Pt requesting something stronger for pain, as he hasn't slept in 2 nights. Lenna Sciara is sending in stronger pain medication (hydrocodone 10/325).

## 2021-01-07 ENCOUNTER — Inpatient Hospital Stay: Payer: BC Managed Care – PPO

## 2021-01-07 ENCOUNTER — Inpatient Hospital Stay (INDEPENDENT_AMBULATORY_CARE_PROVIDER_SITE_OTHER): Payer: BC Managed Care – PPO | Admitting: Hematology and Oncology

## 2021-01-07 ENCOUNTER — Encounter: Payer: Self-pay | Admitting: Hematology and Oncology

## 2021-01-07 ENCOUNTER — Other Ambulatory Visit: Payer: Self-pay | Admitting: Hematology and Oncology

## 2021-01-07 ENCOUNTER — Telehealth: Payer: Self-pay

## 2021-01-07 ENCOUNTER — Telehealth: Payer: Self-pay | Admitting: Hematology and Oncology

## 2021-01-07 ENCOUNTER — Other Ambulatory Visit: Payer: Self-pay

## 2021-01-07 ENCOUNTER — Encounter: Payer: Self-pay | Admitting: Oncology

## 2021-01-07 DIAGNOSIS — C9000 Multiple myeloma not having achieved remission: Secondary | ICD-10-CM | POA: Diagnosis not present

## 2021-01-07 DIAGNOSIS — K047 Periapical abscess without sinus: Secondary | ICD-10-CM | POA: Diagnosis not present

## 2021-01-07 DIAGNOSIS — R220 Localized swelling, mass and lump, head: Secondary | ICD-10-CM | POA: Diagnosis not present

## 2021-01-07 DIAGNOSIS — M609 Myositis, unspecified: Secondary | ICD-10-CM | POA: Diagnosis not present

## 2021-01-07 DIAGNOSIS — K122 Cellulitis and abscess of mouth: Secondary | ICD-10-CM | POA: Diagnosis not present

## 2021-01-07 DIAGNOSIS — C9030 Solitary plasmacytoma not having achieved remission: Secondary | ICD-10-CM | POA: Diagnosis not present

## 2021-01-07 LAB — BASIC METABOLIC PANEL
BUN: 11 (ref 4–21)
CO2: 29 — AB (ref 13–22)
Chloride: 92 — AB (ref 99–108)
Creatinine: 1 (ref 0.6–1.3)
Glucose: 124
Potassium: 3.7 (ref 3.4–5.3)
Sodium: 131 — AB (ref 137–147)

## 2021-01-07 LAB — CBC AND DIFFERENTIAL
HCT: 42 (ref 41–53)
Hemoglobin: 13.7 (ref 13.5–17.5)
Neutrophils Absolute: 11.87
Platelets: 231 (ref 150–399)
WBC: 14.3

## 2021-01-07 LAB — HEPATIC FUNCTION PANEL
ALT: 27 (ref 10–40)
AST: 31 (ref 14–40)
Alkaline Phosphatase: 128 — AB (ref 25–125)
Bilirubin, Total: 0.2

## 2021-01-07 LAB — CBC: RBC: 4.77 (ref 3.87–5.11)

## 2021-01-07 LAB — COMPREHENSIVE METABOLIC PANEL
Albumin: 3.7 (ref 3.5–5.0)
Calcium: 9.2 (ref 8.7–10.7)

## 2021-01-07 MED ORDER — CLINDAMYCIN HCL 300 MG PO CAPS: 600.0000 mg | ORAL_CAPSULE | Freq: Three times a day (TID) | ORAL | 0 refills | Status: DC

## 2021-01-07 MED FILL — Bortezomib For Inj 3.5 MG: INTRAMUSCULAR | Qty: 1.2 | Status: AC

## 2021-01-07 NOTE — Telephone Encounter (Signed)
Per 8/22 Staff Msg, patient scheduled for 8/22 STAT CT Maxillofacial w/Cont - patient is aware of the Appt/Instructions

## 2021-01-07 NOTE — Telephone Encounter (Addendum)
Pt came back to clinic after CT scan completed. Results were reviewed with pt by Rhunette Croft.     Pt here for lab draw & request to see a nurse. I brought pt back to the triage room to see how I could help him. Pt removes his mask, and his entire left jaw down to neck and chin are swollen, and red. Pt is unable to open his jaw completely. He can only drink a milkshake. He reports the pain is better, since Melissa increased his pain medication. He denies fevers.  I notified Dr Bobby Rumpf of above. He recommends a CT of the involved area. Melissa,NP, comes to the triage room to assess pt. She tells pt the plan is to order a CT scan to identify the issue. Pt will wait in the office until scans scheduled. Pt's brother is with him today.  Pt also has a list of other questions he would like answered. Melissa,NP, instructed him that those question would have to be addressed by Dr Bobby Rumpf. The most important thing @ present, is to get this area scanned today.

## 2021-01-07 NOTE — Progress Notes (Signed)
[place]  [address] [place],  [place]  [address] [phone]  Clinic Day:  01/07/2021  Referring physician: Angelina Sheriff, MD   HISTORY OF PRESENT ILLNESS:  Andrew Espinoza is a 65 y.o. 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  This is based upon his marrow having >10% plasma cells.  Cytogenetics was normal, but the myeloma FISH panel could not be done due to insufficient quantity.  He was started on RVD (RevIimid, Velcade, Decadron).   He did have problems with his 2nd cycle of RVD, which included mouth sores, altered taste and failure to thrive.  He requests that his Revlimid dose be decreased. He presents to clinic today with new onset left facial swelling and pain. He states this has been increasing since the weekend. He is unable to open his mouth to eat and has only been drinking milkshakes. He also has several questions regarding his treatment plan for which I advised we will need to address with Dr. Bobby Rumpf once these new symptoms of facial swelling have been addressed. We will obtain CT imaging of his face today and proceed once imaging has been resulted.   VITALS:  There were no vitals taken for this visit.  Wt Readings from Last 3 Encounters:  01/01/21 245 lb (111.1 kg)  12/31/20 244 lb 12.8 oz (111 kg)  12/25/20 258 lb (117 kg)    There is no height or weight on file to calculate BMI.  Performance status (ECOG): 1 - Symptomatic but completely ambulatory  PHYSICAL EXAM:  Physical Exam Constitutional:      General: He is not in acute distress.    Appearance: Normal appearance. He is normal weight.  HENT:     Head: Normocephalic and atraumatic.     Comments: Significant swelling noted to left cheek that extends to his left ear, down to his neck and across his chin. There is redness and firmness noted. He is unable to open his mouth for examination. Eyes:     General: No scleral icterus.    Extraocular  Movements: Extraocular movements intact.     Conjunctiva/sclera: Conjunctivae normal.     Pupils: Pupils are equal, round, and reactive to light.  Cardiovascular:     Rate and Rhythm: Normal rate and regular rhythm.     Pulses: Normal pulses.     Heart sounds: Normal heart sounds. No murmur heard.   No friction rub. No gallop.  Pulmonary:     Effort: Pulmonary effort is normal. No respiratory distress.     Breath sounds: Normal breath sounds.  Abdominal:     General: Bowel sounds are normal. There is no distension.     Palpations: Abdomen is soft. There is no hepatomegaly, splenomegaly or mass.     Tenderness: There is no abdominal tenderness.  Musculoskeletal:        General: Normal range of motion.     Cervical back: Normal range of motion and neck supple.     Right lower leg: No edema.     Left lower leg: No edema.  Lymphadenopathy:     Cervical: No cervical adenopathy.  Skin:    General: Skin is warm and dry.  Neurological:     General: No focal deficit present.     Mental Status: He is alert and oriented to person, place, and time. Mental status is at baseline.  Psychiatric:        Mood and  Affect: Mood normal.        Behavior: Behavior normal.        Thought Content: Thought content normal.        Judgment: Judgment normal.    LABS:   CBC Latest Ref Rng & Units 01/07/2021 12/31/2020 12/24/2020  WBC - 14.3 11.8 16.4  Hemoglobin 13.5 - 17.5 13.7 12.5(A) 14.7  Hematocrit 41 - 53 42 39(A) 46  Platelets 150 - 399 231 203 172   CMP Latest Ref Rng & Units 01/07/2021 12/31/2020 12/24/2020  Glucose 70 - 99 mg/dL - - -  BUN 4 - _0 Creatinine 0.6 - 1.3 1.0 1.0 0.9  Sodium 137 - 147 131(A) 135(A) 133(A)  Potassium 3.4 - 5.3 3.7 4.6 2.9(A)  Chloride 99 - 108 92(A) 101 96(A)  CO2 13 - 22 29(A) 28(A) 25(A)  Calcium 8.7 - 10.7 9.2 9.4 7.8(A)  Alkaline Phos 25 - 125 128(A) 67 73  AST 14 - 40 31 32 31  ALT 10 - 40 27 32 32  Exam(s): [id number] CT/CT FACE W/ CM CLINICAL  DATA:  C90.00 / R22.0.  Left-sided facial swelling.  EXAM: CT MAXILLOFACIAL WITH CONTRAST  TECHNIQUE: Multidetector CT imaging of the maxillofacial structures was performed with intravenous contrast. Multiplanar CT image reconstructions were also generated.  CONTRAST:  100 cc of Isovue 300 IV.  COMPARISON:  None.  FINDINGS: Osseous: No fracture or mandibular dislocation.  Orbits: Negative. No traumatic or inflammatory finding.  Sinuses: Mild inferior left maxillary sinus mucosal thickening. Otherwise, sinuses are largely clear.  Soft tissues/dental: There are multiple peripherally enhancing fluid collection along the left mandibular body posteriorly and the mandibular ramus, compatible with subperiosteal abscesses. There is a 1.2 by 0.3 cm collection along the buccal surface of the mandible ( series 2, image 59). More posterior 0.9 x 1.0 cm abscess along the lateral aspect of the left mandibular ramus (series 2, image 51). Smaller 0.6 by 0.2 cm fluid collection along the medial aspect of the mandibular body/ramus junction (series 2, image 52) and more posteriorly along the medial aspect of the mandibular ramus (also series 2, image 52). Extensive surrounding myositis, phlegmon and cellulitis with edematous and enlarged left masseter and pterygoid musculature and edema/stranding of the overlying subcutaneous fat with thickening of the platysma. Edema also involves the imaged left floor mouth. Periapical lucency of the nearby left first mandibular molar (series 2, image 56). Prominent partially imaged left submandibular lymph nodes, likely reactive given above findings.  Limited intracranial: No significant or unexpected finding.  IMPRESSION: Multiple subperiosteal abscesses along the posterior left mandibular body and mandibular ramus, detailed above and measuring up to 1 cm. Periapical lucency of the nearby left first mandibular molar, the likely source of infection.  Extensive surrounding myositis with edematous and enlarged left masseter, left pterygoid musculature, and to a lesser extent the imaged left floor of mouth. Overlying subcutaneous cellulitis, extending inferiorly.   Electronically Signed   By: Margaretha Sheffield M.D.   On: 01/07/2021 14:25  Electronically Signed By: Margaretha Sheffield MD  Electronically Signed Date/Time: 08/22/221428 Dictate Date/Time: 01/07/21 1359    ASSESSMENT & PLAN:  Assessment/Plan:  A 65 y.o. 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  He has developed new onset left sided facial swelling. CT images today confirm multiple abscesses with extensive surrounding myositis and overlying subcutaneous cellulitis. I have discussed the results with the patient and the fact that a left mandibular molar seems to be the likely  source of infection. We discussed the importance of antibiotic therapy and possible intervention with oral surgery. We contacted Dr. Katheren Shams with [facility] who has agreed to see the patient in his [place] office tomorrow at 7am. We will start clindamycin 600 mg three times a day for seven days. I've instructed the patient to take his first dose as soon as he picks up his prescription. His brother is in attendance to today's appointment and notes that he can get the patient to the pharmacy as well as the physician appointment in the morning. We will hold his RVD treatment for now until this issue is addressed. He will return to clinic in one week for further evaluation.   Both he and his brother verbalize understanding of and agreement to the plans discussed today. They know to call the office should any new questions or concerns arise.   Dayton Scrape, FNP-BC

## 2021-01-08 ENCOUNTER — Encounter: Payer: Self-pay | Admitting: Oncology

## 2021-01-08 ENCOUNTER — Ambulatory Visit: Payer: BC Managed Care – PPO

## 2021-01-09 ENCOUNTER — Telehealth: Payer: Self-pay | Admitting: Pharmacist

## 2021-01-09 NOTE — Telephone Encounter (Signed)
I spoke with Andrew Espinoza today and he reported that he had 2 teeth extracted under sedation yesterday at the oral surgeon's office.  He said that he was given additional antibiotics and an antiseptic mouthwash.  He wanted to cancel all of his upcoming appts and said that he will call us back to reschedule once he had some time to recuperate.  I recommended that he get some yogurt to consume while taking the 2 antibiotics as they can sometimes lead to diarrhea.  I told him to please reach out to [place] if he needs anything.

## 2021-01-11 ENCOUNTER — Encounter: Payer: Self-pay | Admitting: Oncology

## 2021-01-14 ENCOUNTER — Inpatient Hospital Stay: Payer: BC Managed Care – PPO

## 2021-01-14 DIAGNOSIS — G4733 Obstructive sleep apnea (adult) (pediatric): Secondary | ICD-10-CM | POA: Diagnosis not present

## 2021-01-15 ENCOUNTER — Ambulatory Visit: Payer: BC Managed Care – PPO

## 2021-01-15 ENCOUNTER — Encounter: Payer: Self-pay | Admitting: Oncology

## 2021-01-17 DIAGNOSIS — C9030 Solitary plasmacytoma not having achieved remission: Secondary | ICD-10-CM | POA: Diagnosis not present

## 2021-01-18 ENCOUNTER — Ambulatory Visit: Payer: BC Managed Care – PPO | Admitting: Oncology

## 2021-01-18 ENCOUNTER — Other Ambulatory Visit: Payer: BC Managed Care – PPO

## 2021-01-22 ENCOUNTER — Other Ambulatory Visit: Payer: BC Managed Care – PPO

## 2021-01-22 ENCOUNTER — Ambulatory Visit: Payer: BC Managed Care – PPO

## 2021-01-22 ENCOUNTER — Ambulatory Visit: Payer: BC Managed Care – PPO | Admitting: Oncology

## 2021-01-23 ENCOUNTER — Ambulatory Visit: Payer: BC Managed Care – PPO

## 2021-01-28 ENCOUNTER — Other Ambulatory Visit: Payer: BC Managed Care – PPO

## 2021-01-29 ENCOUNTER — Ambulatory Visit: Payer: BC Managed Care – PPO

## 2021-02-04 ENCOUNTER — Other Ambulatory Visit: Payer: BC Managed Care – PPO

## 2021-02-05 ENCOUNTER — Ambulatory Visit: Payer: BC Managed Care – PPO

## 2021-02-15 DIAGNOSIS — G4733 Obstructive sleep apnea (adult) (pediatric): Secondary | ICD-10-CM | POA: Diagnosis not present

## 2021-02-25 DIAGNOSIS — G4733 Obstructive sleep apnea (adult) (pediatric): Secondary | ICD-10-CM | POA: Diagnosis not present

## 2021-02-26 DIAGNOSIS — R5383 Other fatigue: Secondary | ICD-10-CM | POA: Diagnosis not present

## 2021-02-26 DIAGNOSIS — F17210 Nicotine dependence, cigarettes, uncomplicated: Secondary | ICD-10-CM | POA: Diagnosis not present

## 2021-02-26 DIAGNOSIS — E662 Morbid (severe) obesity with alveolar hypoventilation: Secondary | ICD-10-CM | POA: Diagnosis not present

## 2021-02-26 DIAGNOSIS — G4733 Obstructive sleep apnea (adult) (pediatric): Secondary | ICD-10-CM | POA: Diagnosis not present

## 2021-02-26 DIAGNOSIS — J4540 Moderate persistent asthma, uncomplicated: Secondary | ICD-10-CM | POA: Diagnosis not present

## 2021-03-04 DIAGNOSIS — J4540 Moderate persistent asthma, uncomplicated: Secondary | ICD-10-CM | POA: Diagnosis not present

## 2021-03-07 DIAGNOSIS — K582 Mixed irritable bowel syndrome: Secondary | ICD-10-CM | POA: Diagnosis not present

## 2021-03-07 DIAGNOSIS — R4182 Altered mental status, unspecified: Secondary | ICD-10-CM | POA: Diagnosis not present

## 2021-03-07 DIAGNOSIS — F419 Anxiety disorder, unspecified: Secondary | ICD-10-CM | POA: Diagnosis not present

## 2021-03-07 DIAGNOSIS — J811 Chronic pulmonary edema: Secondary | ICD-10-CM | POA: Diagnosis not present

## 2021-03-07 DIAGNOSIS — R0602 Shortness of breath: Secondary | ICD-10-CM | POA: Diagnosis not present

## 2021-03-07 DIAGNOSIS — R627 Adult failure to thrive: Secondary | ICD-10-CM | POA: Diagnosis not present

## 2021-03-07 DIAGNOSIS — R9431 Abnormal electrocardiogram [ECG] [EKG]: Secondary | ICD-10-CM | POA: Diagnosis not present

## 2021-03-07 DIAGNOSIS — E785 Hyperlipidemia, unspecified: Secondary | ICD-10-CM | POA: Diagnosis not present

## 2021-03-07 DIAGNOSIS — J9611 Chronic respiratory failure with hypoxia: Secondary | ICD-10-CM | POA: Diagnosis not present

## 2021-03-07 DIAGNOSIS — R140 Abdominal distension (gaseous): Secondary | ICD-10-CM | POA: Diagnosis not present

## 2021-03-07 DIAGNOSIS — R0689 Other abnormalities of breathing: Secondary | ICD-10-CM | POA: Diagnosis not present

## 2021-03-07 DIAGNOSIS — R531 Weakness: Secondary | ICD-10-CM | POA: Diagnosis not present

## 2021-03-07 DIAGNOSIS — I3139 Other pericardial effusion (noninflammatory): Secondary | ICD-10-CM | POA: Diagnosis not present

## 2021-03-07 DIAGNOSIS — F17210 Nicotine dependence, cigarettes, uncomplicated: Secondary | ICD-10-CM | POA: Diagnosis not present

## 2021-03-07 DIAGNOSIS — N401 Enlarged prostate with lower urinary tract symptoms: Secondary | ICD-10-CM | POA: Diagnosis not present

## 2021-03-07 DIAGNOSIS — I3131 Malignant pericardial effusion in diseases classified elsewhere: Secondary | ICD-10-CM | POA: Diagnosis not present

## 2021-03-07 DIAGNOSIS — R0902 Hypoxemia: Secondary | ICD-10-CM | POA: Diagnosis not present

## 2021-03-07 DIAGNOSIS — I517 Cardiomegaly: Secondary | ICD-10-CM | POA: Diagnosis not present

## 2021-03-07 DIAGNOSIS — J45909 Unspecified asthma, uncomplicated: Secondary | ICD-10-CM | POA: Diagnosis not present

## 2021-03-07 DIAGNOSIS — R10817 Generalized abdominal tenderness: Secondary | ICD-10-CM | POA: Diagnosis not present

## 2021-03-07 DIAGNOSIS — I10 Essential (primary) hypertension: Secondary | ICD-10-CM | POA: Diagnosis not present

## 2021-03-07 DIAGNOSIS — Z66 Do not resuscitate: Secondary | ICD-10-CM | POA: Diagnosis not present

## 2021-03-07 DIAGNOSIS — Z79899 Other long term (current) drug therapy: Secondary | ICD-10-CM | POA: Diagnosis not present

## 2021-03-07 DIAGNOSIS — M7918 Myalgia, other site: Secondary | ICD-10-CM | POA: Diagnosis not present

## 2021-03-07 DIAGNOSIS — E669 Obesity, unspecified: Secondary | ICD-10-CM | POA: Diagnosis not present

## 2021-03-07 DIAGNOSIS — Z20822 Contact with and (suspected) exposure to covid-19: Secondary | ICD-10-CM | POA: Diagnosis not present

## 2021-03-07 DIAGNOSIS — C9000 Multiple myeloma not having achieved remission: Secondary | ICD-10-CM | POA: Diagnosis not present

## 2021-03-07 DIAGNOSIS — R509 Fever, unspecified: Secondary | ICD-10-CM | POA: Diagnosis not present

## 2021-03-07 DIAGNOSIS — I959 Hypotension, unspecified: Secondary | ICD-10-CM | POA: Diagnosis not present

## 2021-03-07 DIAGNOSIS — R338 Other retention of urine: Secondary | ICD-10-CM | POA: Diagnosis not present

## 2021-03-07 DIAGNOSIS — N281 Cyst of kidney, acquired: Secondary | ICD-10-CM | POA: Diagnosis not present

## 2021-03-07 DIAGNOSIS — Z6833 Body mass index (BMI) 33.0-33.9, adult: Secondary | ICD-10-CM | POA: Diagnosis not present

## 2021-03-07 DIAGNOSIS — A419 Sepsis, unspecified organism: Secondary | ICD-10-CM | POA: Diagnosis not present

## 2021-03-07 DIAGNOSIS — I700 Atherosclerosis of aorta: Secondary | ICD-10-CM | POA: Diagnosis not present

## 2021-03-07 DIAGNOSIS — R410 Disorientation, unspecified: Secondary | ICD-10-CM | POA: Diagnosis not present

## 2021-03-07 DIAGNOSIS — R109 Unspecified abdominal pain: Secondary | ICD-10-CM | POA: Diagnosis not present

## 2021-03-07 DIAGNOSIS — K760 Fatty (change of) liver, not elsewhere classified: Secondary | ICD-10-CM | POA: Diagnosis not present

## 2021-03-07 DIAGNOSIS — F32A Depression, unspecified: Secondary | ICD-10-CM | POA: Diagnosis not present

## 2021-03-07 DIAGNOSIS — Z743 Need for continuous supervision: Secondary | ICD-10-CM | POA: Diagnosis not present

## 2021-03-13 DIAGNOSIS — B379 Candidiasis, unspecified: Secondary | ICD-10-CM | POA: Diagnosis not present

## 2021-03-18 DIAGNOSIS — F32A Depression, unspecified: Secondary | ICD-10-CM | POA: Diagnosis not present

## 2021-03-18 DIAGNOSIS — K219 Gastro-esophageal reflux disease without esophagitis: Secondary | ICD-10-CM | POA: Diagnosis not present

## 2021-03-18 DIAGNOSIS — R0902 Hypoxemia: Secondary | ICD-10-CM | POA: Diagnosis not present

## 2021-03-18 DIAGNOSIS — R42 Dizziness and giddiness: Secondary | ICD-10-CM | POA: Diagnosis not present

## 2021-03-18 DIAGNOSIS — I959 Hypotension, unspecified: Secondary | ICD-10-CM | POA: Diagnosis not present

## 2021-03-18 DIAGNOSIS — K5900 Constipation, unspecified: Secondary | ICD-10-CM | POA: Diagnosis not present

## 2021-03-18 DIAGNOSIS — J90 Pleural effusion, not elsewhere classified: Secondary | ICD-10-CM | POA: Diagnosis not present

## 2021-03-18 DIAGNOSIS — E7800 Pure hypercholesterolemia, unspecified: Secondary | ICD-10-CM | POA: Diagnosis not present

## 2021-03-18 DIAGNOSIS — I3139 Other pericardial effusion (noninflammatory): Secondary | ICD-10-CM | POA: Diagnosis not present

## 2021-03-18 DIAGNOSIS — Z7952 Long term (current) use of systemic steroids: Secondary | ICD-10-CM | POA: Diagnosis not present

## 2021-03-18 DIAGNOSIS — J449 Chronic obstructive pulmonary disease, unspecified: Secondary | ICD-10-CM | POA: Diagnosis not present

## 2021-03-18 DIAGNOSIS — R0989 Other specified symptoms and signs involving the circulatory and respiratory systems: Secondary | ICD-10-CM | POA: Diagnosis not present

## 2021-03-18 DIAGNOSIS — F419 Anxiety disorder, unspecified: Secondary | ICD-10-CM | POA: Diagnosis not present

## 2021-03-18 DIAGNOSIS — J9811 Atelectasis: Secondary | ICD-10-CM | POA: Diagnosis not present

## 2021-03-18 DIAGNOSIS — Z79899 Other long term (current) drug therapy: Secondary | ICD-10-CM | POA: Diagnosis not present

## 2021-03-18 DIAGNOSIS — I3131 Malignant pericardial effusion in diseases classified elsewhere: Secondary | ICD-10-CM | POA: Diagnosis not present

## 2021-03-18 DIAGNOSIS — J9611 Chronic respiratory failure with hypoxia: Secondary | ICD-10-CM | POA: Diagnosis not present

## 2021-03-18 DIAGNOSIS — Z20822 Contact with and (suspected) exposure to covid-19: Secondary | ICD-10-CM | POA: Diagnosis not present

## 2021-03-18 DIAGNOSIS — F17210 Nicotine dependence, cigarettes, uncomplicated: Secondary | ICD-10-CM | POA: Diagnosis not present

## 2021-03-18 DIAGNOSIS — C9000 Multiple myeloma not having achieved remission: Secondary | ICD-10-CM | POA: Diagnosis not present

## 2021-03-18 DIAGNOSIS — N400 Enlarged prostate without lower urinary tract symptoms: Secondary | ICD-10-CM | POA: Diagnosis not present

## 2021-03-18 DIAGNOSIS — J441 Chronic obstructive pulmonary disease with (acute) exacerbation: Secondary | ICD-10-CM | POA: Diagnosis not present

## 2021-03-18 DIAGNOSIS — R0602 Shortness of breath: Secondary | ICD-10-CM | POA: Diagnosis not present

## 2021-03-18 DIAGNOSIS — Z9981 Dependence on supplemental oxygen: Secondary | ICD-10-CM | POA: Diagnosis not present

## 2021-03-18 DIAGNOSIS — Z792 Long term (current) use of antibiotics: Secondary | ICD-10-CM | POA: Diagnosis not present

## 2021-03-19 ENCOUNTER — Other Ambulatory Visit: Payer: Self-pay | Admitting: Hematology and Oncology

## 2021-03-19 DIAGNOSIS — I3139 Other pericardial effusion (noninflammatory): Secondary | ICD-10-CM | POA: Diagnosis not present

## 2021-03-19 DIAGNOSIS — J449 Chronic obstructive pulmonary disease, unspecified: Secondary | ICD-10-CM | POA: Diagnosis not present

## 2021-03-19 DIAGNOSIS — C9000 Multiple myeloma not having achieved remission: Secondary | ICD-10-CM

## 2021-03-19 DIAGNOSIS — C9030 Solitary plasmacytoma not having achieved remission: Secondary | ICD-10-CM

## 2021-03-20 DIAGNOSIS — G4733 Obstructive sleep apnea (adult) (pediatric): Secondary | ICD-10-CM | POA: Diagnosis not present

## 2021-03-21 DIAGNOSIS — E7800 Pure hypercholesterolemia, unspecified: Secondary | ICD-10-CM | POA: Diagnosis not present

## 2021-03-21 DIAGNOSIS — K219 Gastro-esophageal reflux disease without esophagitis: Secondary | ICD-10-CM | POA: Diagnosis not present

## 2021-03-21 DIAGNOSIS — I252 Old myocardial infarction: Secondary | ICD-10-CM | POA: Diagnosis not present

## 2021-03-21 DIAGNOSIS — M19011 Primary osteoarthritis, right shoulder: Secondary | ICD-10-CM | POA: Diagnosis not present

## 2021-03-21 DIAGNOSIS — N400 Enlarged prostate without lower urinary tract symptoms: Secondary | ICD-10-CM | POA: Diagnosis not present

## 2021-03-21 DIAGNOSIS — Z7951 Long term (current) use of inhaled steroids: Secondary | ICD-10-CM | POA: Diagnosis not present

## 2021-03-21 DIAGNOSIS — F419 Anxiety disorder, unspecified: Secondary | ICD-10-CM | POA: Diagnosis not present

## 2021-03-21 DIAGNOSIS — I700 Atherosclerosis of aorta: Secondary | ICD-10-CM | POA: Diagnosis not present

## 2021-03-21 DIAGNOSIS — F17210 Nicotine dependence, cigarettes, uncomplicated: Secondary | ICD-10-CM | POA: Diagnosis not present

## 2021-03-21 DIAGNOSIS — Z7952 Long term (current) use of systemic steroids: Secondary | ICD-10-CM | POA: Diagnosis not present

## 2021-03-21 DIAGNOSIS — J441 Chronic obstructive pulmonary disease with (acute) exacerbation: Secondary | ICD-10-CM | POA: Diagnosis not present

## 2021-03-21 DIAGNOSIS — C9000 Multiple myeloma not having achieved remission: Secondary | ICD-10-CM | POA: Diagnosis not present

## 2021-03-21 DIAGNOSIS — I3139 Other pericardial effusion (noninflammatory): Secondary | ICD-10-CM | POA: Diagnosis not present

## 2021-03-21 DIAGNOSIS — J9611 Chronic respiratory failure with hypoxia: Secondary | ICD-10-CM | POA: Diagnosis not present

## 2021-03-21 DIAGNOSIS — F32A Depression, unspecified: Secondary | ICD-10-CM | POA: Diagnosis not present

## 2021-03-21 DIAGNOSIS — M19012 Primary osteoarthritis, left shoulder: Secondary | ICD-10-CM | POA: Diagnosis not present

## 2021-03-24 DIAGNOSIS — E785 Hyperlipidemia, unspecified: Secondary | ICD-10-CM | POA: Diagnosis not present

## 2021-03-24 DIAGNOSIS — Z7952 Long term (current) use of systemic steroids: Secondary | ICD-10-CM | POA: Diagnosis not present

## 2021-03-24 DIAGNOSIS — J439 Emphysema, unspecified: Secondary | ICD-10-CM | POA: Diagnosis not present

## 2021-03-24 DIAGNOSIS — F17210 Nicotine dependence, cigarettes, uncomplicated: Secondary | ICD-10-CM | POA: Diagnosis not present

## 2021-03-24 DIAGNOSIS — J45909 Unspecified asthma, uncomplicated: Secondary | ICD-10-CM | POA: Diagnosis not present

## 2021-03-24 DIAGNOSIS — R42 Dizziness and giddiness: Secondary | ICD-10-CM | POA: Diagnosis not present

## 2021-03-24 DIAGNOSIS — Z79899 Other long term (current) drug therapy: Secondary | ICD-10-CM | POA: Diagnosis not present

## 2021-03-24 DIAGNOSIS — F419 Anxiety disorder, unspecified: Secondary | ICD-10-CM | POA: Diagnosis not present

## 2021-03-24 DIAGNOSIS — R531 Weakness: Secondary | ICD-10-CM | POA: Diagnosis not present

## 2021-03-24 DIAGNOSIS — Z792 Long term (current) use of antibiotics: Secondary | ICD-10-CM | POA: Diagnosis not present

## 2021-03-24 DIAGNOSIS — Z20822 Contact with and (suspected) exposure to covid-19: Secondary | ICD-10-CM | POA: Diagnosis not present

## 2021-03-26 DIAGNOSIS — M19011 Primary osteoarthritis, right shoulder: Secondary | ICD-10-CM | POA: Diagnosis not present

## 2021-03-26 DIAGNOSIS — E7800 Pure hypercholesterolemia, unspecified: Secondary | ICD-10-CM | POA: Diagnosis not present

## 2021-03-26 DIAGNOSIS — F32A Depression, unspecified: Secondary | ICD-10-CM | POA: Diagnosis not present

## 2021-03-26 DIAGNOSIS — J9611 Chronic respiratory failure with hypoxia: Secondary | ICD-10-CM | POA: Diagnosis not present

## 2021-03-26 DIAGNOSIS — I3139 Other pericardial effusion (noninflammatory): Secondary | ICD-10-CM | POA: Diagnosis not present

## 2021-03-26 DIAGNOSIS — I252 Old myocardial infarction: Secondary | ICD-10-CM | POA: Diagnosis not present

## 2021-03-26 DIAGNOSIS — Z7951 Long term (current) use of inhaled steroids: Secondary | ICD-10-CM | POA: Diagnosis not present

## 2021-03-26 DIAGNOSIS — K219 Gastro-esophageal reflux disease without esophagitis: Secondary | ICD-10-CM | POA: Diagnosis not present

## 2021-03-26 DIAGNOSIS — Z7952 Long term (current) use of systemic steroids: Secondary | ICD-10-CM | POA: Diagnosis not present

## 2021-03-26 DIAGNOSIS — I700 Atherosclerosis of aorta: Secondary | ICD-10-CM | POA: Diagnosis not present

## 2021-03-26 DIAGNOSIS — F17210 Nicotine dependence, cigarettes, uncomplicated: Secondary | ICD-10-CM | POA: Diagnosis not present

## 2021-03-26 DIAGNOSIS — J441 Chronic obstructive pulmonary disease with (acute) exacerbation: Secondary | ICD-10-CM | POA: Diagnosis not present

## 2021-03-26 DIAGNOSIS — M19012 Primary osteoarthritis, left shoulder: Secondary | ICD-10-CM | POA: Diagnosis not present

## 2021-03-26 DIAGNOSIS — F419 Anxiety disorder, unspecified: Secondary | ICD-10-CM | POA: Diagnosis not present

## 2021-03-26 DIAGNOSIS — N400 Enlarged prostate without lower urinary tract symptoms: Secondary | ICD-10-CM | POA: Diagnosis not present

## 2021-03-26 DIAGNOSIS — C9000 Multiple myeloma not having achieved remission: Secondary | ICD-10-CM | POA: Diagnosis not present

## 2021-03-28 DIAGNOSIS — Z8579 Personal history of other malignant neoplasms of lymphoid, hematopoietic and related tissues: Secondary | ICD-10-CM | POA: Diagnosis not present

## 2021-03-28 DIAGNOSIS — Z6832 Body mass index (BMI) 32.0-32.9, adult: Secondary | ICD-10-CM | POA: Diagnosis not present

## 2021-03-28 DIAGNOSIS — J449 Chronic obstructive pulmonary disease, unspecified: Secondary | ICD-10-CM | POA: Diagnosis not present

## 2021-04-04 DIAGNOSIS — K219 Gastro-esophageal reflux disease without esophagitis: Secondary | ICD-10-CM | POA: Diagnosis not present

## 2021-04-04 DIAGNOSIS — J9611 Chronic respiratory failure with hypoxia: Secondary | ICD-10-CM | POA: Diagnosis not present

## 2021-04-04 DIAGNOSIS — Z7951 Long term (current) use of inhaled steroids: Secondary | ICD-10-CM | POA: Diagnosis not present

## 2021-04-04 DIAGNOSIS — M19012 Primary osteoarthritis, left shoulder: Secondary | ICD-10-CM | POA: Diagnosis not present

## 2021-04-04 DIAGNOSIS — F17210 Nicotine dependence, cigarettes, uncomplicated: Secondary | ICD-10-CM | POA: Diagnosis not present

## 2021-04-04 DIAGNOSIS — C9000 Multiple myeloma not having achieved remission: Secondary | ICD-10-CM | POA: Diagnosis not present

## 2021-04-04 DIAGNOSIS — J4540 Moderate persistent asthma, uncomplicated: Secondary | ICD-10-CM | POA: Diagnosis not present

## 2021-04-04 DIAGNOSIS — I252 Old myocardial infarction: Secondary | ICD-10-CM | POA: Diagnosis not present

## 2021-04-04 DIAGNOSIS — M19011 Primary osteoarthritis, right shoulder: Secondary | ICD-10-CM | POA: Diagnosis not present

## 2021-04-04 DIAGNOSIS — N400 Enlarged prostate without lower urinary tract symptoms: Secondary | ICD-10-CM | POA: Diagnosis not present

## 2021-04-04 DIAGNOSIS — E7800 Pure hypercholesterolemia, unspecified: Secondary | ICD-10-CM | POA: Diagnosis not present

## 2021-04-04 DIAGNOSIS — J441 Chronic obstructive pulmonary disease with (acute) exacerbation: Secondary | ICD-10-CM | POA: Diagnosis not present

## 2021-04-04 DIAGNOSIS — I3139 Other pericardial effusion (noninflammatory): Secondary | ICD-10-CM | POA: Diagnosis not present

## 2021-04-04 DIAGNOSIS — Z7952 Long term (current) use of systemic steroids: Secondary | ICD-10-CM | POA: Diagnosis not present

## 2021-04-04 DIAGNOSIS — I700 Atherosclerosis of aorta: Secondary | ICD-10-CM | POA: Diagnosis not present

## 2021-04-04 DIAGNOSIS — F419 Anxiety disorder, unspecified: Secondary | ICD-10-CM | POA: Diagnosis not present

## 2021-04-04 DIAGNOSIS — F32A Depression, unspecified: Secondary | ICD-10-CM | POA: Diagnosis not present

## 2021-04-09 DIAGNOSIS — K219 Gastro-esophageal reflux disease without esophagitis: Secondary | ICD-10-CM | POA: Diagnosis not present

## 2021-04-09 DIAGNOSIS — I252 Old myocardial infarction: Secondary | ICD-10-CM | POA: Diagnosis not present

## 2021-04-09 DIAGNOSIS — M19011 Primary osteoarthritis, right shoulder: Secondary | ICD-10-CM | POA: Diagnosis not present

## 2021-04-09 DIAGNOSIS — Z7951 Long term (current) use of inhaled steroids: Secondary | ICD-10-CM | POA: Diagnosis not present

## 2021-04-09 DIAGNOSIS — I3139 Other pericardial effusion (noninflammatory): Secondary | ICD-10-CM | POA: Diagnosis not present

## 2021-04-09 DIAGNOSIS — J9611 Chronic respiratory failure with hypoxia: Secondary | ICD-10-CM | POA: Diagnosis not present

## 2021-04-09 DIAGNOSIS — I700 Atherosclerosis of aorta: Secondary | ICD-10-CM | POA: Diagnosis not present

## 2021-04-09 DIAGNOSIS — F419 Anxiety disorder, unspecified: Secondary | ICD-10-CM | POA: Diagnosis not present

## 2021-04-09 DIAGNOSIS — C9000 Multiple myeloma not having achieved remission: Secondary | ICD-10-CM | POA: Diagnosis not present

## 2021-04-09 DIAGNOSIS — N400 Enlarged prostate without lower urinary tract symptoms: Secondary | ICD-10-CM | POA: Diagnosis not present

## 2021-04-09 DIAGNOSIS — F32A Depression, unspecified: Secondary | ICD-10-CM | POA: Diagnosis not present

## 2021-04-09 DIAGNOSIS — E7800 Pure hypercholesterolemia, unspecified: Secondary | ICD-10-CM | POA: Diagnosis not present

## 2021-04-09 DIAGNOSIS — Z7952 Long term (current) use of systemic steroids: Secondary | ICD-10-CM | POA: Diagnosis not present

## 2021-04-09 DIAGNOSIS — J441 Chronic obstructive pulmonary disease with (acute) exacerbation: Secondary | ICD-10-CM | POA: Diagnosis not present

## 2021-04-09 DIAGNOSIS — M19012 Primary osteoarthritis, left shoulder: Secondary | ICD-10-CM | POA: Diagnosis not present

## 2021-04-09 DIAGNOSIS — F17210 Nicotine dependence, cigarettes, uncomplicated: Secondary | ICD-10-CM | POA: Diagnosis not present

## 2021-04-15 ENCOUNTER — Other Ambulatory Visit: Payer: Self-pay | Admitting: Hematology and Oncology

## 2021-04-15 DIAGNOSIS — C9000 Multiple myeloma not having achieved remission: Secondary | ICD-10-CM

## 2021-04-15 DIAGNOSIS — C9030 Solitary plasmacytoma not having achieved remission: Secondary | ICD-10-CM

## 2021-04-15 DIAGNOSIS — G4733 Obstructive sleep apnea (adult) (pediatric): Secondary | ICD-10-CM | POA: Diagnosis not present

## 2021-04-18 DIAGNOSIS — Z6835 Body mass index (BMI) 35.0-35.9, adult: Secondary | ICD-10-CM | POA: Diagnosis not present

## 2021-04-18 DIAGNOSIS — C9030 Solitary plasmacytoma not having achieved remission: Secondary | ICD-10-CM | POA: Diagnosis not present

## 2021-04-19 DIAGNOSIS — J441 Chronic obstructive pulmonary disease with (acute) exacerbation: Secondary | ICD-10-CM | POA: Diagnosis not present

## 2021-04-19 DIAGNOSIS — G4733 Obstructive sleep apnea (adult) (pediatric): Secondary | ICD-10-CM | POA: Diagnosis not present

## 2021-04-23 DIAGNOSIS — J441 Chronic obstructive pulmonary disease with (acute) exacerbation: Secondary | ICD-10-CM | POA: Diagnosis not present

## 2021-04-23 DIAGNOSIS — F17210 Nicotine dependence, cigarettes, uncomplicated: Secondary | ICD-10-CM | POA: Diagnosis not present

## 2021-04-23 DIAGNOSIS — Z7952 Long term (current) use of systemic steroids: Secondary | ICD-10-CM | POA: Diagnosis not present

## 2021-04-23 DIAGNOSIS — M19012 Primary osteoarthritis, left shoulder: Secondary | ICD-10-CM | POA: Diagnosis not present

## 2021-04-23 DIAGNOSIS — N400 Enlarged prostate without lower urinary tract symptoms: Secondary | ICD-10-CM | POA: Diagnosis not present

## 2021-04-23 DIAGNOSIS — Z7951 Long term (current) use of inhaled steroids: Secondary | ICD-10-CM | POA: Diagnosis not present

## 2021-04-23 DIAGNOSIS — I3139 Other pericardial effusion (noninflammatory): Secondary | ICD-10-CM | POA: Diagnosis not present

## 2021-04-23 DIAGNOSIS — M19011 Primary osteoarthritis, right shoulder: Secondary | ICD-10-CM | POA: Diagnosis not present

## 2021-04-23 DIAGNOSIS — E7800 Pure hypercholesterolemia, unspecified: Secondary | ICD-10-CM | POA: Diagnosis not present

## 2021-04-23 DIAGNOSIS — J9611 Chronic respiratory failure with hypoxia: Secondary | ICD-10-CM | POA: Diagnosis not present

## 2021-04-23 DIAGNOSIS — I252 Old myocardial infarction: Secondary | ICD-10-CM | POA: Diagnosis not present

## 2021-04-23 DIAGNOSIS — K219 Gastro-esophageal reflux disease without esophagitis: Secondary | ICD-10-CM | POA: Diagnosis not present

## 2021-04-23 DIAGNOSIS — C9000 Multiple myeloma not having achieved remission: Secondary | ICD-10-CM | POA: Diagnosis not present

## 2021-04-23 DIAGNOSIS — F419 Anxiety disorder, unspecified: Secondary | ICD-10-CM | POA: Diagnosis not present

## 2021-04-23 DIAGNOSIS — F32A Depression, unspecified: Secondary | ICD-10-CM | POA: Diagnosis not present

## 2021-04-23 DIAGNOSIS — I700 Atherosclerosis of aorta: Secondary | ICD-10-CM | POA: Diagnosis not present

## 2021-05-01 ENCOUNTER — Other Ambulatory Visit: Payer: Self-pay | Admitting: Hematology and Oncology

## 2021-05-01 DIAGNOSIS — C9030 Solitary plasmacytoma not having achieved remission: Secondary | ICD-10-CM

## 2021-05-01 DIAGNOSIS — C9000 Multiple myeloma not having achieved remission: Secondary | ICD-10-CM

## 2021-05-02 DIAGNOSIS — Z7951 Long term (current) use of inhaled steroids: Secondary | ICD-10-CM | POA: Diagnosis not present

## 2021-05-02 DIAGNOSIS — I3139 Other pericardial effusion (noninflammatory): Secondary | ICD-10-CM | POA: Diagnosis not present

## 2021-05-02 DIAGNOSIS — J441 Chronic obstructive pulmonary disease with (acute) exacerbation: Secondary | ICD-10-CM | POA: Diagnosis not present

## 2021-05-02 DIAGNOSIS — F17210 Nicotine dependence, cigarettes, uncomplicated: Secondary | ICD-10-CM | POA: Diagnosis not present

## 2021-05-02 DIAGNOSIS — N400 Enlarged prostate without lower urinary tract symptoms: Secondary | ICD-10-CM | POA: Diagnosis not present

## 2021-05-02 DIAGNOSIS — I252 Old myocardial infarction: Secondary | ICD-10-CM | POA: Diagnosis not present

## 2021-05-02 DIAGNOSIS — Z7952 Long term (current) use of systemic steroids: Secondary | ICD-10-CM | POA: Diagnosis not present

## 2021-05-02 DIAGNOSIS — J9611 Chronic respiratory failure with hypoxia: Secondary | ICD-10-CM | POA: Diagnosis not present

## 2021-05-02 DIAGNOSIS — M19012 Primary osteoarthritis, left shoulder: Secondary | ICD-10-CM | POA: Diagnosis not present

## 2021-05-02 DIAGNOSIS — F32A Depression, unspecified: Secondary | ICD-10-CM | POA: Diagnosis not present

## 2021-05-02 DIAGNOSIS — I700 Atherosclerosis of aorta: Secondary | ICD-10-CM | POA: Diagnosis not present

## 2021-05-02 DIAGNOSIS — C9000 Multiple myeloma not having achieved remission: Secondary | ICD-10-CM | POA: Diagnosis not present

## 2021-05-02 DIAGNOSIS — F419 Anxiety disorder, unspecified: Secondary | ICD-10-CM | POA: Diagnosis not present

## 2021-05-02 DIAGNOSIS — E7800 Pure hypercholesterolemia, unspecified: Secondary | ICD-10-CM | POA: Diagnosis not present

## 2021-05-02 DIAGNOSIS — K219 Gastro-esophageal reflux disease without esophagitis: Secondary | ICD-10-CM | POA: Diagnosis not present

## 2021-05-02 DIAGNOSIS — M19011 Primary osteoarthritis, right shoulder: Secondary | ICD-10-CM | POA: Diagnosis not present

## 2021-05-04 DIAGNOSIS — J4540 Moderate persistent asthma, uncomplicated: Secondary | ICD-10-CM | POA: Diagnosis not present

## 2021-05-09 DIAGNOSIS — F17210 Nicotine dependence, cigarettes, uncomplicated: Secondary | ICD-10-CM | POA: Diagnosis not present

## 2021-05-09 DIAGNOSIS — F419 Anxiety disorder, unspecified: Secondary | ICD-10-CM | POA: Diagnosis not present

## 2021-05-09 DIAGNOSIS — J9611 Chronic respiratory failure with hypoxia: Secondary | ICD-10-CM | POA: Diagnosis not present

## 2021-05-09 DIAGNOSIS — M19012 Primary osteoarthritis, left shoulder: Secondary | ICD-10-CM | POA: Diagnosis not present

## 2021-05-09 DIAGNOSIS — I700 Atherosclerosis of aorta: Secondary | ICD-10-CM | POA: Diagnosis not present

## 2021-05-09 DIAGNOSIS — I3139 Other pericardial effusion (noninflammatory): Secondary | ICD-10-CM | POA: Diagnosis not present

## 2021-05-09 DIAGNOSIS — N400 Enlarged prostate without lower urinary tract symptoms: Secondary | ICD-10-CM | POA: Diagnosis not present

## 2021-05-09 DIAGNOSIS — M19011 Primary osteoarthritis, right shoulder: Secondary | ICD-10-CM | POA: Diagnosis not present

## 2021-05-09 DIAGNOSIS — Z7952 Long term (current) use of systemic steroids: Secondary | ICD-10-CM | POA: Diagnosis not present

## 2021-05-09 DIAGNOSIS — J441 Chronic obstructive pulmonary disease with (acute) exacerbation: Secondary | ICD-10-CM | POA: Diagnosis not present

## 2021-05-09 DIAGNOSIS — I252 Old myocardial infarction: Secondary | ICD-10-CM | POA: Diagnosis not present

## 2021-05-09 DIAGNOSIS — F32A Depression, unspecified: Secondary | ICD-10-CM | POA: Diagnosis not present

## 2021-05-09 DIAGNOSIS — K219 Gastro-esophageal reflux disease without esophagitis: Secondary | ICD-10-CM | POA: Diagnosis not present

## 2021-05-09 DIAGNOSIS — Z7951 Long term (current) use of inhaled steroids: Secondary | ICD-10-CM | POA: Diagnosis not present

## 2021-05-09 DIAGNOSIS — C9000 Multiple myeloma not having achieved remission: Secondary | ICD-10-CM | POA: Diagnosis not present

## 2021-05-09 DIAGNOSIS — E7800 Pure hypercholesterolemia, unspecified: Secondary | ICD-10-CM | POA: Diagnosis not present

## 2021-05-16 DIAGNOSIS — C9000 Multiple myeloma not having achieved remission: Secondary | ICD-10-CM | POA: Diagnosis not present

## 2021-05-16 DIAGNOSIS — I700 Atherosclerosis of aorta: Secondary | ICD-10-CM | POA: Diagnosis not present

## 2021-05-16 DIAGNOSIS — Z7951 Long term (current) use of inhaled steroids: Secondary | ICD-10-CM | POA: Diagnosis not present

## 2021-05-16 DIAGNOSIS — F419 Anxiety disorder, unspecified: Secondary | ICD-10-CM | POA: Diagnosis not present

## 2021-05-16 DIAGNOSIS — I252 Old myocardial infarction: Secondary | ICD-10-CM | POA: Diagnosis not present

## 2021-05-16 DIAGNOSIS — I3139 Other pericardial effusion (noninflammatory): Secondary | ICD-10-CM | POA: Diagnosis not present

## 2021-05-16 DIAGNOSIS — Z7952 Long term (current) use of systemic steroids: Secondary | ICD-10-CM | POA: Diagnosis not present

## 2021-05-16 DIAGNOSIS — M19011 Primary osteoarthritis, right shoulder: Secondary | ICD-10-CM | POA: Diagnosis not present

## 2021-05-16 DIAGNOSIS — K219 Gastro-esophageal reflux disease without esophagitis: Secondary | ICD-10-CM | POA: Diagnosis not present

## 2021-05-16 DIAGNOSIS — J441 Chronic obstructive pulmonary disease with (acute) exacerbation: Secondary | ICD-10-CM | POA: Diagnosis not present

## 2021-05-16 DIAGNOSIS — J9611 Chronic respiratory failure with hypoxia: Secondary | ICD-10-CM | POA: Diagnosis not present

## 2021-05-16 DIAGNOSIS — F32A Depression, unspecified: Secondary | ICD-10-CM | POA: Diagnosis not present

## 2021-05-16 DIAGNOSIS — F17210 Nicotine dependence, cigarettes, uncomplicated: Secondary | ICD-10-CM | POA: Diagnosis not present

## 2021-05-16 DIAGNOSIS — E7800 Pure hypercholesterolemia, unspecified: Secondary | ICD-10-CM | POA: Diagnosis not present

## 2021-05-16 DIAGNOSIS — N400 Enlarged prostate without lower urinary tract symptoms: Secondary | ICD-10-CM | POA: Diagnosis not present

## 2021-05-16 DIAGNOSIS — M19012 Primary osteoarthritis, left shoulder: Secondary | ICD-10-CM | POA: Diagnosis not present

## 2021-05-22 ENCOUNTER — Other Ambulatory Visit: Payer: Self-pay

## 2021-05-22 ENCOUNTER — Other Ambulatory Visit: Payer: Self-pay | Admitting: Hematology and Oncology

## 2021-05-22 MED ORDER — HYDROCODONE-ACETAMINOPHEN 10-325 MG PO TABS: 1.0000 | ORAL_TABLET | ORAL | 0 refills | Status: DC | PRN

## 2021-05-24 ENCOUNTER — Other Ambulatory Visit: Payer: Self-pay | Admitting: Hematology and Oncology

## 2021-05-24 DIAGNOSIS — C9030 Solitary plasmacytoma not having achieved remission: Secondary | ICD-10-CM

## 2021-05-24 DIAGNOSIS — C9000 Multiple myeloma not having achieved remission: Secondary | ICD-10-CM

## 2021-05-24 NOTE — Progress Notes (Signed)
[facility]  [address] [place],  [place]  [address] [phone]  Clinic Day:  05/29/2021  Referring physician: Angelina Sheriff, MD  This document serves as a record of services personally performed by Camren Lipsett Macarthur Critchley, MD. It was created on their behalf by [facility] E, a trained medical scribe. The creation of this record is based on the scribe's personal observations and the provider's statements to them.  HISTORY OF PRESENT ILLNESS:  The patient is a 66 y.o. 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 This is based upon his marrow having >10% plasma cells.  Cytogenetics was normal, but the myeloma FISH panel could not be done due to insufficient quantity.  The patient received 2 cycles of RVD therapy.  He was scheduled to receive his third cycle of RVD, but he began having significant oral problems.  CT imaging revealed multiple abscesses with extensive surrounding myositis and overlying subcutaneous cellulitis, with a left mandibular molar appearing to be the likely source of locally extensive infection.  Based upon this and his personal preference, the patient abruptly discontinued his RVD therapy, as well as his Zometa.  He comes back in 5 months later today to reassess the status of his multiple myeloma.  He admits to having more diffuse bone pain.  Of note, this gentleman did receive palliative radiation to his lower back last year, which was related to his multiple myeloma causing painful bone lesions in this area.  He is concerned more bone disease has developed over these past few months.  VITALS:  Blood pressure (!) 146/89, pulse 89, temperature 97.8 F (36.6 C), resp. rate 18, height $RemoveBe'5\' 10"'LRLiJpiPt$  (1.778 m), weight 225 lb 1.6 oz (102.1 kg), SpO2 97 %.  Wt Readings from Last 3 Encounters:  05/29/21 225 lb 1.6 oz (102.1 kg)  01/01/21 245 lb (111.1 kg)  12/31/20 244 lb 12.8 oz (111 kg)    Body mass index is 32.3  kg/m.  Performance status (ECOG): 1 - Symptomatic but completely ambulatory  PHYSICAL EXAM:  Physical Exam Constitutional:      General: He is not in acute distress.    Appearance: Normal appearance. He is normal weight.  HENT:     Head: Normocephalic and atraumatic.  Eyes:     General: No scleral icterus.    Extraocular Movements: Extraocular movements intact.     Conjunctiva/sclera: Conjunctivae normal.     Pupils: Pupils are equal, round, and reactive to light.  Cardiovascular:     Rate and Rhythm: Normal rate and regular rhythm.     Pulses: Normal pulses.     Heart sounds: Normal heart sounds. No murmur heard.   No friction rub. No gallop.  Pulmonary:     Effort: Pulmonary effort is normal. No respiratory distress.     Breath sounds: Normal breath sounds.  Abdominal:     General: Bowel sounds are normal. There is no distension.     Palpations: Abdomen is soft. There is no hepatomegaly, splenomegaly or mass.     Tenderness: There is no abdominal tenderness.  Musculoskeletal:        General: Normal range of motion.     Cervical back: Normal range of motion and neck supple.     Right lower leg: No edema.     Left lower leg: No edema.  Lymphadenopathy:     Cervical: No cervical adenopathy.  Skin:    General: Skin is warm and  dry.  Neurological:     General: No focal deficit present.     Mental Status: He is alert and oriented to person, place, and time. Mental status is at baseline.  Psychiatric:        Mood and Affect: Mood normal.        Behavior: Behavior normal.        Thought Content: Thought content normal.        Judgment: Judgment normal.    LABS:   CBC Latest Ref Rng & Units 05/29/2021 01/07/2021 12/31/2020  WBC - 5.7 14.3 11.8  Hemoglobin 13.5 - 17.5 13.8 13.7 12.5(A)  Hematocrit 41 - 53 42 42 39(A)  Platelets 150 - 399 187 231 203   CMP Latest Ref Rng & Units 05/29/2021 01/07/2021 12/31/2020  Glucose 70 - 99 mg/dL - - -  BUN 4 - $R'21 5 11 19  'gD$ Creatinine  0.6 - 1.3 0.7 1.0 1.0  Sodium 137 - 147 139 131(A) 135(A)  Potassium 3.4 - 5.3 3.0(A) 3.7 4.6  Chloride 99 - 108 105 92(A) 101  CO2 13 - 22 26(A) 29(A) 28(A)  Calcium 8.7 - 10.7 9.3 9.2 9.4  Alkaline Phos 25 - 125 64 128(A) 67  AST 14 - 40 55(A) 31 32  ALT 10 - 40 24 27 32   ASSESSMENT & PLAN:  Assessment/Plan:  A 66 y.o. 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  I am pleased with his peripheral counts today.  However, his multiple myeloma parameters are currently pending.  As this gentleman does have increased bone pain, I will arrange for him to undergo a skeletal survey next week to identify all areas of myelomatous bone involvement.  As mentioned previously, the patient personally made the decision to discontinue his RVD therapy 5 months ago.  I explained to the patient that I want him to give me some idea of what he wishes to do moving forward, particularly as multiple myeloma is considered incurable disease which will ultimately progress over time when therapy is discontinued.  I will see him back next week to go over his skeletal survey, as well as his most recent multiple myeloma parameters.  Hopefully, by then, the patient will have formulated an idea of how he wishes to treat his multiple myeloma moving forward.  The patient understands all the plans discussed today and is in agreement with them.     I, Rita Ohara, am acting as scribe for Marice Potter, MD    I have reviewed this report as typed by the medical scribe, and it is complete and accurate.  Nolen Lindamood Macarthur Critchley, MD

## 2021-05-28 DIAGNOSIS — G4733 Obstructive sleep apnea (adult) (pediatric): Secondary | ICD-10-CM | POA: Diagnosis not present

## 2021-05-29 ENCOUNTER — Other Ambulatory Visit: Payer: Self-pay | Admitting: Hematology and Oncology

## 2021-05-29 ENCOUNTER — Other Ambulatory Visit: Payer: Self-pay | Admitting: Oncology

## 2021-05-29 ENCOUNTER — Telehealth: Payer: Self-pay | Admitting: Oncology

## 2021-05-29 ENCOUNTER — Inpatient Hospital Stay: Payer: BC Managed Care – PPO | Attending: Oncology | Admitting: Oncology

## 2021-05-29 ENCOUNTER — Inpatient Hospital Stay: Payer: BC Managed Care – PPO

## 2021-05-29 ENCOUNTER — Other Ambulatory Visit: Payer: Self-pay

## 2021-05-29 VITALS — BP 146/89 | HR 89 | Temp 97.8°F | Resp 18 | Ht 70.0 in | Wt 225.1 lb

## 2021-05-29 DIAGNOSIS — C9000 Multiple myeloma not having achieved remission: Secondary | ICD-10-CM

## 2021-05-29 DIAGNOSIS — K582 Mixed irritable bowel syndrome: Secondary | ICD-10-CM | POA: Insufficient documentation

## 2021-05-29 DIAGNOSIS — E876 Hypokalemia: Secondary | ICD-10-CM

## 2021-05-29 DIAGNOSIS — E785 Hyperlipidemia, unspecified: Secondary | ICD-10-CM | POA: Insufficient documentation

## 2021-05-29 LAB — BASIC METABOLIC PANEL
BUN: 5 (ref 4–21)
CO2: 26 — AB (ref 13–22)
Chloride: 105 (ref 99–108)
Creatinine: 0.7 (ref 0.6–1.3)
Glucose: 155
Potassium: 3 — AB (ref 3.4–5.3)
Sodium: 139 (ref 137–147)

## 2021-05-29 LAB — CBC AND DIFFERENTIAL
HCT: 42 (ref 41–53)
Hemoglobin: 13.8 (ref 13.5–17.5)
Neutrophils Absolute: 3.08
Platelets: 187 (ref 150–399)
WBC: 5.7

## 2021-05-29 LAB — CBC: RBC: 4.74 (ref 3.87–5.11)

## 2021-05-29 LAB — HEPATIC FUNCTION PANEL
ALT: 24 (ref 10–40)
AST: 55 — AB (ref 14–40)
Alkaline Phosphatase: 64 (ref 25–125)
Bilirubin, Total: 0.5

## 2021-05-29 LAB — COMPREHENSIVE METABOLIC PANEL
Albumin: 4.2 (ref 3.5–5.0)
Calcium: 9.3 (ref 8.7–10.7)

## 2021-05-29 MED ORDER — POTASSIUM CHLORIDE CRYS ER 20 MEQ PO TBCR: 20.0000 meq | EXTENDED_RELEASE_TABLET | Freq: Two times a day (BID) | ORAL | 0 refills | Status: DC

## 2021-05-29 NOTE — Telephone Encounter (Signed)
Per 1/11 los next appt scheduled and confirmed with patient

## 2021-05-30 LAB — KAPPA/LAMBDA LIGHT CHAINS
Kappa free light chain: 35.1 mg/L — ABNORMAL HIGH (ref 3.3–19.4)
Kappa, lambda light chain ratio: 0.75 (ref 0.26–1.65)
Lambda free light chains: 46.6 mg/L — ABNORMAL HIGH (ref 5.7–26.3)

## 2021-05-30 NOTE — Progress Notes (Signed)
Andrew Espinoza  [address] [place],  [place]  [address] [phone]  Clinic Day:  06/05/2021  Referring physician: Angelina Sheriff, MD  This document serves as a record of services personally performed by Andrew Macarthur Critchley, MD. It was created on their behalf by [facility] E, a trained medical scribe. Andrew creation of this record is based on Andrew scribe's personal observations and Andrew provider's statements to them.  HISTORY OF PRESENT ILLNESS:  Andrew Espinoza is a 66 y.o. 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 This is based upon his marrow having >10% plasma cells.  Cytogenetics was normal, but Andrew myeloma FISH panel could not be done due to insufficient quantity.  Andrew Espinoza received 2 cycles of RVD therapy.  He was scheduled to receive his third cycle of RVD, but he began having significant oral problems.  He recently came back in to re-establish care.  He comes in today to go over his most recent multiple myeloma parameters, as well as a skeletal survey.  Overall, Andrew Espinoza claims to be doing okay.  He does have intermittent pain in his lower back.  He relates to his concern about is left jaw decay, which is likely related to Andrew Zometa he received for bone protection against his underlying multiple myeloma.  Of note, this Espinoza has not received any form of systemic therapy since August 2022.    VITALS:  Blood pressure 140/77, pulse (!) 101, temperature (!) 97.5 F (36.4 C), resp. rate 16, height $RemoveBe'5\' 10"'etqzZlcJx$  (1.778 m), weight 218 lb 1.6 oz (98.9 kg), SpO2 94 %.  Wt Readings from Last 3 Encounters:  06/05/21 218 lb 1.6 oz (98.9 kg)  05/29/21 225 lb 1.6 oz (102.1 kg)  01/01/21 245 lb (111.1 kg)    Body mass index is 31.29 kg/m.  Performance status (ECOG): 1 - Symptomatic but completely ambulatory  PHYSICAL EXAM:  Physical Exam Constitutional:      General: He is not in acute distress.    Appearance: Normal appearance.  He is normal weight.  HENT:     Head: Normocephalic and atraumatic.  Eyes:     General: No scleral icterus.    Extraocular Movements: Extraocular movements intact.     Conjunctiva/sclera: Conjunctivae normal.     Pupils: Pupils are equal, round, and reactive to light.  Cardiovascular:     Rate and Rhythm: Regular rhythm. Tachycardia present.     Pulses: Normal pulses.     Heart sounds: Normal heart sounds. No murmur heard.   No friction rub. No gallop.  Pulmonary:     Effort: Pulmonary effort is normal. No respiratory distress.     Breath sounds: Normal breath sounds.  Abdominal:     General: Bowel sounds are normal. There is no distension.     Palpations: Abdomen is soft. There is no hepatomegaly, splenomegaly or mass.     Tenderness: There is no abdominal tenderness.  Musculoskeletal:        General: Normal range of motion.     Cervical back: Normal range of motion and neck supple.     Right lower leg: No edema.     Left lower leg: No edema.  Lymphadenopathy:     Cervical: No cervical adenopathy.  Skin:    General: Skin is warm and dry.  Neurological:     General: No focal deficit present.     Mental Status: He is alert and oriented to  person, place, and time. Mental status is at baseline.  Psychiatric:        Mood and Affect: Mood normal.        Behavior: Behavior normal.        Thought Content: Thought content normal.        Judgment: Judgment normal.    LABS:   CBC Latest Ref Rng & Units 05/29/2021 01/07/2021 12/31/2020  WBC - 5.7 14.3 11.8  Hemoglobin 13.5 - 17.5 13.8 13.7 12.5(A)  Hematocrit 41 - 53 42 42 39(A)  Platelets 150 - 399 187 231 203   CMP Latest Ref Rng & Units 05/29/2021 01/07/2021 12/31/2020  Glucose 70 - 99 mg/dL - - -  BUN 4 - _0 Creatinine 0.6 - 1.3 0.7 1.0 1.0  Sodium 137 - 147 139 131(A) 135(A)  Potassium 3.4 - 5.3 3.0(A) 3.7 4.6  Chloride 99 - 108 105 92(A) 101  CO2 13 - 22 26(A) 29(A) 28(A)  Calcium 8.7 - 10.7 9.3 9.2 9.4  Alkaline  Phos 25 - 125 64 128(A) 67  AST 14 - 40 55(A) 31 32  ALT 10 - 40 24 27 32     Latest Reference Range & Units 05/29/21 10:53  Total Protein ELP 6.0 - 8.5 g/dL 6.3 (C)  Albumin SerPl Elph-Mcnc 2.9 - 4.4 g/dL 3.7 (C)  Albumin/Glob SerPl 0.7 - 1.7  1.5 (C)  Alpha2 Glob SerPl Elph-Mcnc 0.4 - 1.0 g/dL 0.6 (C)  Alpha 1 0.0 - 0.4 g/dL 0.3 (C)  Gamma Glob SerPl Elph-Mcnc 0.4 - 1.8 g/dL 0.9 (C)  M Protein SerPl Elph-Mcnc Not Observed g/dL 0.3 (H) (C)  IFE 1  Comment ! (C)  Globulin, Total 2.2 - 3.9 g/dL 2.6 (C)  B-Globulin SerPl Elph-Mcnc 0.7 - 1.3 g/dL 0.8 (C)  IgG (Immunoglobin G), Serum 603 - 1,613 mg/dL 942  IgM (Immunoglobulin M), Srm 20 - 172 mg/dL 17 (L)  IgA 61 - 437 mg/dL 207  Please Note (HCV):  Comment (C)  Kappa free light chain 3.3 - 19.4 mg/L 35.1 (H)  Lambda free light chains 5.7 - 26.3 mg/L 46.6 (H)  Kappa, lambda light chain ratio 0.26 - 1.65  0.75  (H): Data is abnormally high !: Data is abnormal (L): Data is abnormally low (C): Corrected  SCANS: His recent skeletal survey revealed Andrew following: FINDINGS: There again numerous small lucent lesions overlying Andrew calvarium. This may be secondary to low bone mineralization. No definite change from prior. Cannot exclude myelomatous lesions.  No lucent lesions are seen within either humerus. Moderate inferior right humeral head-neck junction degenerative osteophytosis, unchanged.  No suspicious lytic lesion is seen within either radius or ulna. 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 This is unchanged from prior.  Postsurgical changes are again seen of T11 corpectomy with T11 strut graft and posterior rod and screw fusion hardware spanning T9 through L1. Mild-to-moderate T11 vertebral body height loss, unchanged. Mild T12 vertebral body height loss, unchanged.  No suspicious lytic lesion is seen within either femur or  either tibia/fibula. Lateral plate and screw fixation hardware is again seen overlying Andrew distal left fibula.  Bowel gas and stool within Andrew ascending colon overlie Andrew right ilium. Mild bowel gas overlies Andrew left ilium in a changed pattern compared to prior.  Cardiac silhouette is at Andrew upper limits of normal size. Bilateral lower lung horizontal linear scarring is similar to prior. No pneumothorax. Mild calcification within Andrew aortic  arch.  IMPRESSION: Compared to 10/25/2020:  No significant change from prior.  There are again multiple lucent lesions overlying Andrew calvarium. This may be secondary to low bone mineralization. Cannot exclude myelomatous lesions.  Otherwise, no definite lytic lesions suspicious for multiple myeloma are seen.  Postsurgical changes of T11 corpectomy with T9 through L1 posterior rod and screw fusion hardware.  ASSESSMENT & PLAN:  Assessment/Plan:  A 66 y.o. 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  In clinic today, I went over all of his recent multiple myeloma parameters with him.  Unsurprisingly, they show signs of rising, albeit very slightly.  Andrew Espinoza understands as he has not been treated in over 5 months, it is unsurprising his disease levels are beginning to rise.  In clinic today, we came to Andrew mutual decision to restart Revlimid from a maintenance therapy standpoint.  This medicine will be given at 10 mg daily.  He understands Velcade and Decadron will not be given.  As he has had osteonecrosis of Andrew jaw, Zometa will also not be given.  Andrew hope is his Revlimid dose will be enough to keep his disease in check moving forward.  I will see him back in 2 months to reassess his multiple myeloma parameters to see how well his maintenance Revlimid therapy is working.  Andrew Espinoza understands all Andrew plans discussed today and is in agreement with them.     I, Rita Ohara, am acting as scribe for Marice Potter, MD    I have  reviewed this report as typed by Andrew medical scribe, and it is complete and accurate.  Andrew Macarthur Critchley, MD

## 2021-06-03 LAB — MULTIPLE MYELOMA PANEL, SERUM
Albumin SerPl Elph-Mcnc: 3.7 g/dL (ref 2.9–4.4)
Albumin/Glob SerPl: 1.5 (ref 0.7–1.7)
Alpha 1: 0.3 g/dL (ref 0.0–0.4)
Alpha2 Glob SerPl Elph-Mcnc: 0.6 g/dL (ref 0.4–1.0)
B-Globulin SerPl Elph-Mcnc: 0.8 g/dL (ref 0.7–1.3)
Gamma Glob SerPl Elph-Mcnc: 0.9 g/dL (ref 0.4–1.8)
Globulin, Total: 2.6 g/dL (ref 2.2–3.9)
IgA: 207 mg/dL (ref 61–437)
IgG (Immunoglobin G), Serum: 942 mg/dL (ref 603–1613)
IgM (Immunoglobulin M), Srm: 17 mg/dL — ABNORMAL LOW (ref 20–172)
M Protein SerPl Elph-Mcnc: 0.3 g/dL — ABNORMAL HIGH
Total Protein ELP: 6.3 g/dL (ref 6.0–8.5)

## 2021-06-04 DIAGNOSIS — J4540 Moderate persistent asthma, uncomplicated: Secondary | ICD-10-CM | POA: Diagnosis not present

## 2021-06-04 DIAGNOSIS — C9000 Multiple myeloma not having achieved remission: Secondary | ICD-10-CM | POA: Diagnosis not present

## 2021-06-05 ENCOUNTER — Inpatient Hospital Stay (INDEPENDENT_AMBULATORY_CARE_PROVIDER_SITE_OTHER): Payer: BC Managed Care – PPO | Admitting: Oncology

## 2021-06-05 ENCOUNTER — Other Ambulatory Visit: Payer: Self-pay | Admitting: Oncology

## 2021-06-05 ENCOUNTER — Telehealth: Payer: Self-pay

## 2021-06-05 ENCOUNTER — Other Ambulatory Visit (HOSPITAL_COMMUNITY): Payer: Self-pay

## 2021-06-05 ENCOUNTER — Telehealth: Payer: Self-pay | Admitting: Oncology

## 2021-06-05 ENCOUNTER — Other Ambulatory Visit: Payer: Self-pay

## 2021-06-05 VITALS — BP 140/77 | HR 101 | Temp 97.5°F | Resp 16 | Ht 70.0 in | Wt 218.1 lb

## 2021-06-05 DIAGNOSIS — C9000 Multiple myeloma not having achieved remission: Secondary | ICD-10-CM

## 2021-06-05 MED ORDER — LENALIDOMIDE 10 MG PO CAPS
10.0000 mg | ORAL_CAPSULE | Freq: Every day | ORAL | 3 refills | Status: DC
  Filled 2021-06-05: qty 30, 30d supply, fill #0

## 2021-06-05 NOTE — Telephone Encounter (Signed)
Per 1/18 los next appt scheduled and confirmed with patient

## 2021-06-06 ENCOUNTER — Encounter: Payer: Self-pay | Admitting: Oncology

## 2021-06-06 ENCOUNTER — Other Ambulatory Visit (HOSPITAL_COMMUNITY): Payer: Self-pay

## 2021-06-06 MED ORDER — LENALIDOMIDE 10 MG PO CAPS: 10.0000 mg | ORAL_CAPSULE | Freq: Every day | ORAL | 0 refills | Status: DC

## 2021-06-06 NOTE — Telephone Encounter (Addendum)
Oral Oncology Pharmacist Encounter  Received new prescription for lenalidomide (Revlimid) for the treatment of multiple myeloma, planned duration until disease progression or unacceptable toxicity. Prescription dose and frequency assessed.   Patient restarting therapy and patient has no questions or concerns about restarting. Patient was paused for dental work.  Labs from 05/29/2021 assessed, potassium supplements sent in on 05/29/2021 as potassium is decreased. No other interventions needed.   Current medication list in Epic reviewed, DDIs with revlimid identified: none  Evaluated chart and no patient barriers to medication adherence noted.   Patient agreement for treatment documented in MD note on 06/05/2021.  Prescription has been e-scribed to the [facility] for benefits analysis and approval.  Oral Oncology Clinic will continue to follow for insurance authorization, copayment issues, initial counseling and start date.  Drema Halon, PharmD Hematology/Oncology Clinical Pharmacist [facility] [phone] 06/06/2021 8:36 AM

## 2021-06-07 ENCOUNTER — Encounter: Payer: Self-pay | Admitting: Oncology

## 2021-06-18 ENCOUNTER — Other Ambulatory Visit (HOSPITAL_COMMUNITY): Payer: Self-pay

## 2021-06-20 ENCOUNTER — Other Ambulatory Visit: Payer: Self-pay

## 2021-06-20 DIAGNOSIS — C9000 Multiple myeloma not having achieved remission: Secondary | ICD-10-CM

## 2021-06-20 DIAGNOSIS — D492 Neoplasm of unspecified behavior of bone, soft tissue, and skin: Secondary | ICD-10-CM

## 2021-06-20 MED ORDER — HYDROCODONE-ACETAMINOPHEN 10-325 MG PO TABS: 1.0000 | ORAL_TABLET | ORAL | 0 refills | Status: DC | PRN

## 2021-07-04 ENCOUNTER — Other Ambulatory Visit: Payer: Self-pay | Admitting: Hematology and Oncology

## 2021-07-04 MED ORDER — PROCHLORPERAZINE MALEATE 10 MG PO TABS: 10.0000 mg | ORAL_TABLET | Freq: Four times a day (QID) | ORAL | 3 refills | Status: DC | PRN

## 2021-07-05 DIAGNOSIS — J4540 Moderate persistent asthma, uncomplicated: Secondary | ICD-10-CM | POA: Diagnosis not present

## 2021-07-15 DIAGNOSIS — G4733 Obstructive sleep apnea (adult) (pediatric): Secondary | ICD-10-CM | POA: Diagnosis not present

## 2021-07-17 ENCOUNTER — Other Ambulatory Visit: Payer: Self-pay

## 2021-07-17 DIAGNOSIS — D492 Neoplasm of unspecified behavior of bone, soft tissue, and skin: Secondary | ICD-10-CM

## 2021-07-17 DIAGNOSIS — C9000 Multiple myeloma not having achieved remission: Secondary | ICD-10-CM

## 2021-07-17 MED ORDER — HYDROCODONE-ACETAMINOPHEN 10-325 MG PO TABS: 1.0000 | ORAL_TABLET | ORAL | 0 refills | Status: DC | PRN

## 2021-07-18 DIAGNOSIS — R030 Elevated blood-pressure reading, without diagnosis of hypertension: Secondary | ICD-10-CM | POA: Diagnosis not present

## 2021-07-18 DIAGNOSIS — C9030 Solitary plasmacytoma not having achieved remission: Secondary | ICD-10-CM | POA: Diagnosis not present

## 2021-07-18 DIAGNOSIS — Z6830 Body mass index (BMI) 30.0-30.9, adult: Secondary | ICD-10-CM | POA: Diagnosis not present

## 2021-07-25 NOTE — Progress Notes (Incomplete)
Andrew Espinoza  [address] [place],  [place]  [address] [phone]  Clinic Day:  07/25/2021  Referring physician: Angelina Sheriff, MD  This document serves as a record of services personally performed by [name], MD. It was created on their behalf by [facility], a trained medical scribe. The creation of this record is based on the scribe's personal observations and the provider's statements to them.  HISTORY OF PRESENT ILLNESS:  The patient is a 66 y.o. 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 This is based upon his marrow having >10% plasma cells.  Cytogenetics was normal, but the myeloma FISH panel could not be done due to insufficient quantity.  The patient received 2 cycles of RVD therapy.  He was scheduled to receive his third cycle of RVD, but he began having significant oral problems.  He recently came back in to re-establish care.  He comes in today to go over his most recent multiple myeloma parameters, as well as a skeletal survey.  Overall, the patient claims to be doing okay.  He does have intermittent pain in his lower back.  He relates to his concern about is left jaw decay, which is likely related to the Zometa he received for bone protection against his underlying multiple myeloma.  Of note, this patient has not received any form of systemic therapy since August 2022.    VITALS:  There were no vitals taken for this visit.  Wt Readings from Last 3 Encounters:  06/05/21 218 lb 1.6 oz (98.9 kg)  05/29/21 225 lb 1.6 oz (102.1 kg)  01/01/21 245 lb (111.1 kg)    There is no height or weight on file to calculate BMI.  Performance status (ECOG): 1 - Symptomatic but completely ambulatory  PHYSICAL EXAM:  Physical Exam Constitutional:      General: He is not in acute distress.    Appearance: Normal appearance. He is normal weight.  HENT:     Head: Normocephalic and atraumatic.  Eyes:     General:  No scleral icterus.    Extraocular Movements: Extraocular movements intact.     Conjunctiva/sclera: Conjunctivae normal.     Pupils: Pupils are equal, round, and reactive to light.  Cardiovascular:     Rate and Rhythm: Regular rhythm. Tachycardia present.     Pulses: Normal pulses.     Heart sounds: Normal heart sounds. No murmur heard.   No friction rub. No gallop.  Pulmonary:     Effort: Pulmonary effort is normal. No respiratory distress.     Breath sounds: Normal breath sounds.  Abdominal:     General: Bowel sounds are normal. There is no distension.     Palpations: Abdomen is soft. There is no hepatomegaly, splenomegaly or mass.     Tenderness: There is no abdominal tenderness.  Musculoskeletal:        General: Normal range of motion.     Cervical back: Normal range of motion and neck supple.     Right lower leg: No edema.     Left lower leg: No edema.  Lymphadenopathy:     Cervical: No cervical adenopathy.  Skin:    General: Skin is warm and dry.  Neurological:     General: No focal deficit present.     Mental Status: He is alert and oriented to person, place, and time. Mental status is at baseline.  Psychiatric:  Mood and Affect: Mood normal.        Behavior: Behavior normal.        Thought Content: Thought content normal.        Judgment: Judgment normal.    LABS:   CBC Latest Ref Rng & Units 05/29/2021 01/07/2021 12/31/2020  WBC - 5.7 14.3 11.8  Hemoglobin 13.5 - 17.5 13.8 13.7 12.5(A)  Hematocrit 41 - 53 42 42 39(A)  Platelets 150 - 399 187 231 203   CMP Latest Ref Rng & Units 05/29/2021 01/07/2021 12/31/2020  Glucose 70 - 99 mg/dL - - -  BUN 4 - _0 Creatinine 0.6 - 1.3 0.7 1.0 1.0  Sodium 137 - 147 139 131(A) 135(A)  Potassium 3.4 - 5.3 3.0(A) 3.7 4.6  Chloride 99 - 108 105 92(A) 101  CO2 13 - 22 26(A) 29(A) 28(A)  Calcium 8.7 - 10.7 9.3 9.2 9.4  Alkaline Phos 25 - 125 64 128(A) 67  AST 14 - 40 55(A) 31 32  ALT 10 - 40 24 27 32     Latest  Reference Range & Units 05/29/21 10:53  Total Protein ELP 6.0 - 8.5 g/dL 6.3 (C)  Albumin SerPl Elph-Mcnc 2.9 - 4.4 g/dL 3.7 (C)  Albumin/Glob SerPl 0.7 - 1.7  1.5 (C)  Alpha2 Glob SerPl Elph-Mcnc 0.4 - 1.0 g/dL 0.6 (C)  Alpha 1 0.0 - 0.4 g/dL 0.3 (C)  Gamma Glob SerPl Elph-Mcnc 0.4 - 1.8 g/dL 0.9 (C)  M Protein SerPl Elph-Mcnc Not Observed g/dL 0.3 (H) (C)  IFE 1  Comment ! (C)  Globulin, Total 2.2 - 3.9 g/dL 2.6 (C)  B-Globulin SerPl Elph-Mcnc 0.7 - 1.3 g/dL 0.8 (C)  IgG (Immunoglobin G), Serum 603 - 1,613 mg/dL 942  IgM (Immunoglobulin M), Srm 20 - 172 mg/dL 17 (L)  IgA 61 - 437 mg/dL 207  Please Note (HCV):  Comment (C)  Kappa free light chain 3.3 - 19.4 mg/L 35.1 (H)  Lambda free light chains 5.7 - 26.3 mg/L 46.6 (H)  Kappa, lambda light chain ratio 0.26 - 1.65  0.75  (H): Data is abnormally high !: Data is abnormal (L): Data is abnormally low (C): Corrected   ASSESSMENT & PLAN:  Assessment/Plan:  A 66 y.o. 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  In clinic today, I went over all of his recent multiple myeloma parameters with him.  Unsurprisingly, they show signs of rising, albeit very slightly.  The patient understands as he has not been treated in over 5 months, it is unsurprising his disease levels are beginning to rise.  In clinic today, we came to the mutual decision to restart Revlimid from a maintenance therapy standpoint.  This medicine will be given at 10 mg daily.  He understands Velcade and Decadron will not be given.  As he has had osteonecrosis of the jaw, Zometa will also not be given.  The hope is his Revlimid dose will be enough to keep his disease in check moving forward.  I will see him back in 2 months to reassess his multiple myeloma parameters to see how well his maintenance Revlimid therapy is working.  The patient understands all the plans discussed today and is in agreement with them.     I, Rita Ohara, am acting as scribe for Marice Potter,  MD    I have reviewed this report as typed by the medical scribe, and it is complete and accurate.  [name], MD

## 2021-07-26 ENCOUNTER — Other Ambulatory Visit: Payer: Self-pay

## 2021-07-26 ENCOUNTER — Telehealth: Payer: Self-pay

## 2021-07-26 ENCOUNTER — Encounter: Payer: Self-pay | Admitting: Hematology and Oncology

## 2021-07-26 ENCOUNTER — Inpatient Hospital Stay: Payer: BC Managed Care – PPO | Attending: Oncology

## 2021-07-26 DIAGNOSIS — C9000 Multiple myeloma not having achieved remission: Secondary | ICD-10-CM | POA: Diagnosis not present

## 2021-07-26 DIAGNOSIS — Z7961 Long term (current) use of immunomodulator: Secondary | ICD-10-CM | POA: Diagnosis not present

## 2021-07-26 LAB — BASIC METABOLIC PANEL
BUN: 8 (ref 4–21)
CO2: 34 — AB (ref 13–22)
Chloride: 101 (ref 99–108)
Creatinine: 0.9 (ref 0.6–1.3)
Glucose: 101
Potassium: 2.6 mEq/L — AB (ref 3.5–5.1)
Sodium: 140 (ref 137–147)

## 2021-07-26 LAB — CBC AND DIFFERENTIAL
HCT: 41 (ref 41–53)
Hemoglobin: 13.2 — AB (ref 13.5–17.5)
Neutrophils Absolute: 2.74
Platelets: 240 10*3/uL (ref 150–400)
WBC: 5.7

## 2021-07-26 LAB — HEPATIC FUNCTION PANEL
ALT: 21 U/L (ref 10–40)
AST: 37 (ref 14–40)
Alkaline Phosphatase: 74 (ref 25–125)
Bilirubin, Total: 0.4

## 2021-07-26 LAB — CBC: RBC: 4.56 (ref 3.87–5.11)

## 2021-07-26 LAB — COMPREHENSIVE METABOLIC PANEL
Albumin: 4.1 (ref 3.5–5.0)
Calcium: 8.8 (ref 8.7–10.7)

## 2021-07-26 NOTE — Telephone Encounter (Addendum)
RE: Pt req MMW; on antibiotic for swollen left jaw ?Received: Today ?Melodye Ped, NP  Dairl Ponder, RN ?Yes, please ?  ?   ?Previous Messages ?  ?----- Message -----  ?From: Dairl Ponder, RN  ?Sent: 07/26/2021   2:17 PM EST  ?To: Melodye Ped, NP  ?Subject: FW: Pt req MMW; on antibiotic for swollen le*  ? ?Would you like for me to call in the MMW?  ? ?----- Message -----  ?From: Dairl Ponder, RN  ?Sent: 07/26/2021  10:43 AM EST  ?To: Melodye Ped, NP  ? ?Pt here for labs and req to see a nurse. Pt reports he went to Urgent Care because his left jaw was swollen. He was given an antibiotic. He has f/u appt with oral surgeon on Wednesday next week. The oral surgeon did some type of procedure on his bone lower left side a few weeks ago. I asked pt to open his mouth, as he reports he thought he had thrush. I did not see any visible thrush at all. I did see bone exposed on lower left jaw. No redness or s/s infection @ area. Pt denies fever. He asked if we could send him in the mouthwash we use to [place]? He has taken it before from here. He is still taking Revlimid '10mg'$  as ordered. No missed doses. ?

## 2021-07-29 LAB — MULTIPLE MYELOMA PANEL, SERUM
Albumin SerPl Elph-Mcnc: 3.7 g/dL (ref 2.9–4.4)
Albumin/Glob SerPl: 1.2 (ref 0.7–1.7)
Alpha 1: 0.3 g/dL (ref 0.0–0.4)
Alpha2 Glob SerPl Elph-Mcnc: 0.7 g/dL (ref 0.4–1.0)
B-Globulin SerPl Elph-Mcnc: 0.9 g/dL (ref 0.7–1.3)
Gamma Glob SerPl Elph-Mcnc: 1.1 g/dL (ref 0.4–1.8)
Globulin, Total: 3.1 g/dL (ref 2.2–3.9)
IgA: 314 mg/dL (ref 61–437)
IgG (Immunoglobin G), Serum: 1062 mg/dL (ref 603–1613)
IgM (Immunoglobulin M), Srm: 24 mg/dL (ref 20–172)
M Protein SerPl Elph-Mcnc: 0.2 g/dL — ABNORMAL HIGH
Total Protein ELP: 6.8 g/dL (ref 6.0–8.5)

## 2021-07-29 LAB — KAPPA/LAMBDA LIGHT CHAINS
Kappa free light chain: 73.7 mg/L — ABNORMAL HIGH (ref 3.3–19.4)
Kappa, lambda light chain ratio: 1.14 (ref 0.26–1.65)
Lambda free light chains: 64.4 mg/L — ABNORMAL HIGH (ref 5.7–26.3)

## 2021-08-01 ENCOUNTER — Other Ambulatory Visit: Payer: Self-pay | Admitting: Hematology and Oncology

## 2021-08-01 MED ORDER — TAMSULOSIN HCL 0.4 MG PO CAPS: 0.8000 mg | ORAL_CAPSULE | Freq: Every day | ORAL | 3 refills | Status: DC

## 2021-08-02 ENCOUNTER — Telehealth: Payer: Self-pay | Admitting: Oncology

## 2021-08-02 ENCOUNTER — Inpatient Hospital Stay (INDEPENDENT_AMBULATORY_CARE_PROVIDER_SITE_OTHER): Payer: BC Managed Care – PPO | Admitting: Oncology

## 2021-08-02 ENCOUNTER — Other Ambulatory Visit: Payer: Self-pay | Admitting: Oncology

## 2021-08-02 ENCOUNTER — Encounter: Payer: Self-pay | Admitting: Oncology

## 2021-08-02 VITALS — BP 131/79 | HR 65 | Temp 97.9°F | Resp 16 | Ht 70.0 in | Wt 220.1 lb

## 2021-08-02 DIAGNOSIS — J4540 Moderate persistent asthma, uncomplicated: Secondary | ICD-10-CM | POA: Diagnosis not present

## 2021-08-02 DIAGNOSIS — C9000 Multiple myeloma not having achieved remission: Secondary | ICD-10-CM

## 2021-08-02 NOTE — Progress Notes (Signed)
Pt c/o thrush in mouth, left mandible swollen, necrotic bone in mandible. ?

## 2021-08-02 NOTE — Telephone Encounter (Signed)
Per 08/02/21 los next appt scheduled and confirmed with patient ?

## 2021-08-09 ENCOUNTER — Telehealth: Payer: Self-pay | Admitting: Pharmacy Technician

## 2021-08-09 ENCOUNTER — Telehealth: Payer: Self-pay

## 2021-08-09 ENCOUNTER — Other Ambulatory Visit: Payer: Self-pay | Admitting: Oncology

## 2021-08-09 ENCOUNTER — Other Ambulatory Visit (HOSPITAL_COMMUNITY): Payer: Self-pay

## 2021-08-09 MED ORDER — IXAZOMIB CITRATE 4 MG PO CAPS
ORAL_CAPSULE | ORAL | 3 refills | Status: DC
Start: 2021-08-09 — End: 2021-11-11
  Filled 2021-08-09: qty 3, fill #0
  Filled 2021-08-13: qty 3, 28d supply, fill #0
  Filled 2021-09-03: qty 3, 28d supply, fill #1
  Filled 2021-10-10: qty 3, 28d supply, fill #2
  Filled 2021-11-08: qty 3, 28d supply, fill #3

## 2021-08-09 NOTE — Telephone Encounter (Signed)
Received New start notification for  Ninlaro '4mg'$  . Will update as we work through the benefits process. ? ?Submitted a Prior Authorization request to [facility] for  Ninlaro '4mg'$   via CoverMyMeds. Will update once we receive a response. ? ? ?Key: [id number] ? ?

## 2021-08-12 ENCOUNTER — Other Ambulatory Visit (HOSPITAL_COMMUNITY): Payer: Self-pay

## 2021-08-12 NOTE — Telephone Encounter (Signed)
Oral Oncology Pharmacist Encounter ? ?Received new prescription for ixazomib (Ninlaro) for the treatment of multiple myeloma as single agent, planned duration until disease progression or unacceptable toxicity. Prescription dose and frequency assessed. ? ?Labs from 07/26/21 assessed, no interventions needed. ? ?Current medication list in Epic reviewed, DDIs with Ninlaro identified: none ? ?Evaluated chart and no patient barriers to medication adherence noted.  ? ?Patient agreement for treatment documented in MD note on 08/09/21. ? ?Prescription has been e-scribed to the [facility] for benefits analysis and approval. ? ?Oral Oncology Clinic will continue to follow for insurance authorization, copayment issues, initial counseling and start date. ? ?Drema Halon, PharmD ?Hematology/Oncology Clinical Pharmacist ?[facility] ?[phone] ? ?08/12/2021 8:48 AM ?Oral Oncology Clinic ?[phone] ? ?

## 2021-08-12 NOTE — Telephone Encounter (Signed)
Oral Oncology Patient Advocate Encounter ? ?Prior Authorization for Ninlaro '4mg'$  has been approved.   ? ?PA# [id number] ?Effective dates: 08/09/21 through 08/08/22 ? ?Patients co-pay is $0.00 ? ?Oral Oncology Clinic will continue to follow.   ?

## 2021-08-13 ENCOUNTER — Other Ambulatory Visit (HOSPITAL_COMMUNITY): Payer: Self-pay

## 2021-08-13 NOTE — Telephone Encounter (Signed)
Oral Chemotherapy Pharmacist Encounter ? ?I spoke with patient for overview of: Ninlaro for the treatment of multiple myeloma, planned duration until disease progression or unacceptable toxicity. ? ?Counseled patient on administration, dosing, side effects, monitoring, drug-food interactions, safe handling, storage, and disposal. ? ?Patient will take Ninlaro $RemoveBefo'4mg'[id number]$  capsules, 1 capsule by mouth once weekly. Take on an empty stomach, 1 hour before or 2 hours after a meal. Take on days 1, 8, and 15 of each 28 day cycle. ? ?Ninlaro start date: 08/17/2021 ? ?Adverse effects of Ninlaro include but are not limited to: peripheral edema, constipation, nausea, vomiting, decreased blood counts, and peripheral neuropathy.   ?Patient has anti-emetic on hand and knows to take it if nausea develops.   ? ?Reviewed with patient importance of keeping a medication schedule and plan for any missed doses. No barriers to medication adherence identified. ? ?Medication reconciliation performed and medication/allergy list updated. ? ?Confirmed with patient they have picked up acyclovir prescription and have started it. ?Patient on daily baby aspirin. ?Importance of both of these supportive care medications reviewed with patient. ? ?Ship broker for Automatic Data have been obtained. ? ?All questions answered. ? ?Mr. Owens Shark voiced understanding and appreciation.  ? ?Medication education handout placed in mail for patient. Patient knows to call the office with questions or concerns. Oral Chemotherapy Clinic phone number provided to patient.  ? ?Drema Halon, PharmD ?Hematology/Oncology Clinical Pharmacist ?[facility] ?[phone] ?08/13/2021 10:45 AM ? ? ?

## 2021-08-14 ENCOUNTER — Encounter: Payer: Self-pay | Admitting: Oncology

## 2021-08-14 DIAGNOSIS — G4733 Obstructive sleep apnea (adult) (pediatric): Secondary | ICD-10-CM | POA: Diagnosis not present

## 2021-08-16 ENCOUNTER — Other Ambulatory Visit (HOSPITAL_COMMUNITY): Payer: Self-pay

## 2021-08-22 ENCOUNTER — Other Ambulatory Visit (HOSPITAL_COMMUNITY): Payer: Self-pay

## 2021-08-22 ENCOUNTER — Other Ambulatory Visit: Payer: Self-pay | Admitting: Hematology and Oncology

## 2021-08-22 DIAGNOSIS — C9000 Multiple myeloma not having achieved remission: Secondary | ICD-10-CM

## 2021-08-22 DIAGNOSIS — D492 Neoplasm of unspecified behavior of bone, soft tissue, and skin: Secondary | ICD-10-CM

## 2021-08-22 MED ORDER — HYDROCODONE-ACETAMINOPHEN 10-325 MG PO TABS: 1.0000 | ORAL_TABLET | ORAL | 0 refills | Status: DC | PRN

## 2021-08-27 ENCOUNTER — Other Ambulatory Visit (HOSPITAL_COMMUNITY): Payer: Self-pay

## 2021-08-29 ENCOUNTER — Telehealth: Payer: Self-pay

## 2021-08-29 NOTE — Telephone Encounter (Signed)
I spoke with pt. He did start the Ninlaro on 08/17/2021. He is taking the Ninlaro on Saturday's @ 12 noon, without food. He is also taking a nausea medication when he takes the Automatic Data. No missed doses. This Sat will be his 3rd dose. He denies fever, N/V, mouth sores, diarrhea, & skin rashes. He is aware to call us if he develops temp of 100.4 or higher, day or night. I made him an appt for tomorrow to f/u with Melissa and get labs (2 wk f/u). ?

## 2021-08-30 ENCOUNTER — Ambulatory Visit: Payer: BC Managed Care – PPO | Admitting: Hematology and Oncology

## 2021-08-30 ENCOUNTER — Inpatient Hospital Stay: Payer: BC Managed Care – PPO | Attending: Oncology | Admitting: Hematology and Oncology

## 2021-08-30 ENCOUNTER — Other Ambulatory Visit: Payer: Self-pay

## 2021-08-30 ENCOUNTER — Encounter: Payer: Self-pay | Admitting: Hematology and Oncology

## 2021-08-30 ENCOUNTER — Inpatient Hospital Stay: Payer: BC Managed Care – PPO

## 2021-08-30 DIAGNOSIS — C9000 Multiple myeloma not having achieved remission: Secondary | ICD-10-CM | POA: Diagnosis not present

## 2021-08-30 DIAGNOSIS — C9030 Solitary plasmacytoma not having achieved remission: Secondary | ICD-10-CM

## 2021-08-30 LAB — CBC AND DIFFERENTIAL
HCT: 41 (ref 41–53)
Hemoglobin: 13.3 — AB (ref 13.5–17.5)
Neutrophils Absolute: 3.81
Platelets: 168 10*3/uL (ref 150–400)
WBC: 6.8

## 2021-08-30 LAB — BASIC METABOLIC PANEL
BUN: 9 (ref 4–21)
CO2: 27 — AB (ref 13–22)
Chloride: 102 (ref 99–108)
Creatinine: 0.9 (ref 0.6–1.3)
Glucose: 94
Potassium: 3.2 mEq/L — AB (ref 3.5–5.1)
Sodium: 139 (ref 137–147)

## 2021-08-30 LAB — HEPATIC FUNCTION PANEL
ALT: 19 U/L (ref 10–40)
AST: 45 — AB (ref 14–40)
Alkaline Phosphatase: 77 (ref 25–125)
Bilirubin, Total: 0.3

## 2021-08-30 LAB — CBC: RBC: 4.55 (ref 3.87–5.11)

## 2021-08-30 LAB — COMPREHENSIVE METABOLIC PANEL
Albumin: 4.4 (ref 3.5–5.0)
Calcium: 9.4 (ref 8.7–10.7)

## 2021-08-30 NOTE — Progress Notes (Cosign Needed)
?Patient Care Team: ?Angelina Sheriff, MD as PCP - General (Family Medicine) ?Ashok Pall, MD as Consulting Physician (Neurosurgery) ?Marice Potter, MD as Consulting Physician (Oncology) ?Gatha Mayer, MD as Consulting Physician (Radiation Oncology) ? ?Clinic Day:  08/30/2021 ? ?Referring physician: Angelina Sheriff, MD ? ?ASSESSMENT & PLAN:  ? ?Assessment & Plan: ?Multiple myeloma ([place]) ?A 66 y.o. 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  In clinic today, I went over all of his recent multiple myeloma parameters with him.  Although his M spike did drop slightly, his lambda light chain level rose slightly. The patient was not happy with being on Revlimid as he feels it is contributing to his osteonecrosis of the jaw. He was placed on Ixazomib 4 mg, which he will take once weekly every 3 out of 4 weeks. He has completed 2 doses so far and is doing well. CBC and CMP are unremarkable today. He will keep scheduled follow up with Dr. Bobby Rumpf.   ? ?The patient understands the plans discussed today and is in agreement with them.  He knows to contact our office if he develops concerns prior to his next appointment. ? ? ? ?Melodye Ped, NP  ?[place] ?[place] ?[place] [address] ?Dept: [phone] ?Dept Fax: [phone]  ? ?Orders Placed This Encounter  ?Procedures  ? CBC and differential  ?  This external order was created through the Results Console.  ? CBC  ?  This external order was created through the Results Console.  ? Basic metabolic panel  ?  This external order was created through the Results Console.  ? Comprehensive metabolic panel  ?  This external order was created through the Results Console.  ? Hepatic function panel  ?  This external order was created through the Results Console.  ?  ? ? ?CHIEF COMPLAINT:  ?CC: A 66 year old male with history of multiple myeloma here for 2 week evaluation after  starting ninlaro ? ?Current Treatment:  Ninlaro 4 mg Days 1, 8 and 15 every 21 days. ? ?INTERVAL HISTORY:  ?Andrew Espinoza is here today for repeat clinical assessment. He denies fevers or chills. He denies pain. His appetite is good. His weight has been stable. ? ?I have reviewed the past medical history, past surgical history, social history and family history with the patient and they are unchanged from previous note. ? ?ALLERGIES:  is allergic to elemental sulfur, penicillins, seasonal ic [cholestatin], and clarithromycin. ? ?MEDICATIONS:  ?Current Outpatient Medications  ?Medication Sig Dispense Refill  ? acyclovir (ZOVIRAX) 400 MG tablet Take 1 tablet (400 mg total) by mouth daily. 90 tablet 3  ? albuterol (VENTOLIN HFA) 108 (90 Base) MCG/ACT inhaler Inhale 1-2 puffs into the lungs every 6 (six) hours as needed for wheezing.    ? BREO ELLIPTA 100-25 MCG/INH AEPB Inhale 1 puff into the lungs daily.    ? buPROPion (WELLBUTRIN XL) 150 MG 24 hr tablet Take 150 mg by mouth daily.    ? chlorhexidine (PERIDEX) 0.12 % solution 15 mLs by Mouth Rinse route 2 (two) times daily.    ? Cholecalciferol (VITAMIN D3) 50 MCG (2000 UT) CAPS Take 2 capsules by mouth in the morning and at bedtime.    ? cyclobenzaprine (FLEXERIL) 10 MG tablet Take 10 mg by mouth 3 (three) times daily as needed.    ? dicyclomine (BENTYL) 10 MG capsule Take 10 mg by mouth 3 (  three) times daily as needed for spasms.    ? esomeprazole (NEXIUM) 40 MG capsule Take 40 mg by mouth daily at 12 noon.    ? HYDROcodone-acetaminophen (NORCO) 10-325 MG tablet Take 1 tablet by mouth every 4 (four) hours as needed (pain). 90 tablet 0  ? ixazomib citrate (NINLARO) 4 MG capsule Take 1 capsule (4 mg) by mouth weekly, 3 weeks on, 1 week off, repeat every 4 weeks. Take on an empty stomach 1hr before or 2hr after meals. 3 capsule 3  ? Loperamide HCl 1 MG/7.5ML LIQD Take 2 mg by mouth daily as needed (diarrhea or loose stools).    ? ondansetron (ZOFRAN) 4 MG tablet Take 1 tablet  (4 mg total) by mouth every 4 (four) hours as needed for nausea. 90 tablet 3  ? potassium chloride SA (KLOR-CON M) 20 MEQ tablet TAKE 1 TABLET BY MOUTH TWICE DAILY AS DIRECTED 30 tablet 0  ? pravastatin (PRAVACHOL) 20 MG tablet Take 20 mg by mouth daily.    ? prochlorperazine (COMPAZINE) 10 MG tablet Take 1 tablet (10 mg total) by mouth every 6 (six) hours as needed for nausea or vomiting. 90 tablet 3  ? tamsulosin (FLOMAX) 0.4 MG CAPS capsule Take 2 capsules (0.8 mg total) by mouth daily. 30 capsule 3  ? venlafaxine XR (EFFEXOR-XR) 150 MG 24 hr capsule Take 150 mg by mouth daily.    ? tiZANidine (ZANAFLEX) 4 MG tablet Take 1 tablet (4 mg total) by mouth every 6 (six) hours as needed for muscle spasms. (Patient not taking: Reported on 08/30/2021) 60 tablet 0  ? ?No current facility-administered medications for this visit.  ? ? ?HISTORY OF PRESENT ILLNESS:  ? ?Oncology History  ?Multiple myeloma ([place])  ?10/24/2020 Cancer Staging  ? Staging form: Plasma Cell Myeloma and Plasma Cell Disorders, AJCC 8th Edition ?- Clinical stage from 10/24/2020: RISS Stage I (Beta-2-microglobulin (mg/L): 2.5, Albumin (g/dL): 4.1, ISS: Stage I, High-risk cytogenetics: Absent, LDH: Normal) - Signed by Marice Potter, MD on 12/31/2020 ?Histopathologic type: Multiple myeloma ?Stage prefix: Initial diagnosis ?Beta 2 microglobulin range (mg/L): Less than 3.5 ?Albumin range (g/dL): Greater than or equal to 3.5 ?Cytogenetics: No abnormalities ? ?  ?11/07/2020 Initial Diagnosis  ? Multiple myeloma ([place]) ?  ?11/20/2020 -  Chemotherapy  ?  Patient is on Treatment Plan: MYELOMA  RVD SQ Q21D X 4 CYCLES ? ?  ? ?  ?  ? ? ?REVIEW OF SYSTEMS:  ? ?Constitutional: Denies fevers, chills or abnormal weight loss ?Eyes: Denies blurriness of vision ?Ears, nose, mouth, throat, and face: Denies mucositis or sore throat ?Respiratory: Denies cough, dyspnea or wheezes ?Cardiovascular: Denies palpitation, chest discomfort or lower extremity swelling ?Gastrointestinal:   Denies nausea, heartburn or change in bowel habits ?Skin: Denies abnormal skin rashes ?Lymphatics: Denies new lymphadenopathy or easy bruising ?Neurological:Denies numbness, tingling or new weaknesses ?Behavioral/Psych: Mood is stable, no new changes  ?All other systems were reviewed with the patient and are negative. ? ? ?VITALS:  ?Blood pressure 129/88, pulse 77, temperature 98.2 ?F (36.8 ?C), temperature source Oral, resp. rate 20, height $RemoveBe'5\' 10"'hXgXDyNNP$  (1.778 m), weight 227 lb 1.6 oz (103 kg), SpO2 95 %.  ?Wt Readings from Last 3 Encounters:  ?08/30/21 227 lb 1.6 oz (103 kg)  ?08/02/21 220 lb 1.6 oz (99.8 kg)  ?06/05/21 218 lb 1.6 oz (98.9 kg)  ?  ?Body mass index is 32.59 kg/m?. ? ?Performance status (ECOG): 1 - Symptomatic but completely ambulatory ? ?PHYSICAL EXAM:  ? ?GENERAL:alert,  no distress and comfortable ?SKIN: skin color, texture, turgor are normal, no rashes or significant lesions ?EYES: normal, Conjunctiva are pink and non-injected, sclera clear ?OROPHARYNX:no exudate, no erythema and lips, buccal mucosa, and tongue normal  ?NECK: supple, thyroid normal size, non-tender, without nodularity ?LYMPH:  no palpable lymphadenopathy in the cervical, axillary or inguinal ?LUNGS: clear to auscultation and percussion with normal breathing effort ?HEART: regular rate & rhythm and no murmurs and no lower extremity edema ?ABDOMEN:abdomen soft, non-tender and normal bowel sounds ?Musculoskeletal:no cyanosis of digits and no clubbing  ?NEURO: alert & oriented x 3 with fluent speech, no focal motor/sensory deficits ? ?LABORATORY DATA:  ?I have reviewed the data as listed ?   ?Component Value Date/Time  ? NA 139 08/30/2021 0000  ? K 3.2 (A) 08/30/2021 0000  ? CL 102 08/30/2021 0000  ? CO2 27 (A) 08/30/2021 0000  ? GLUCOSE 139 (H) 09/24/2020 1408  ? BUN 9 08/30/2021 0000  ? CREATININE 0.9 08/30/2021 0000  ? CREATININE 1.38 (H) 09/26/2020 1823  ? CALCIUM 9.4 08/30/2021 0000  ? ALBUMIN 4.4 08/30/2021 0000  ? AST 45 (A)  08/30/2021 0000  ? ALT 19 08/30/2021 0000  ? ALKPHOS 77 08/30/2021 0000  ? GFRNONAA 57 (L) 09/26/2020 1823  ? ? ?No results found for: SPEP, UPEP ? ?Lab Results  ?Component Value Date  ? WBC 6.8 08/30/2021  ? NEUTRO

## 2021-08-30 NOTE — Assessment & Plan Note (Signed)
A 66 y.o. 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  In clinic today, I went over all of his recent multiple myeloma parameters with him.  Although his M spike did drop slightly, his lambda light chain level rose slightly. The patient was not happy with being on Revlimid as he feels it is contributing to his osteonecrosis of the jaw. He was placed on Ixazomib 4 mg, which he will take once weekly every 3 out of 4 weeks. He has completed 2 doses so far and is doing well. CBC and CMP are unremarkable today. He will keep scheduled follow up with Dr. Bobby Rumpf.  ?

## 2021-09-02 DIAGNOSIS — J4540 Moderate persistent asthma, uncomplicated: Secondary | ICD-10-CM | POA: Diagnosis not present

## 2021-09-03 ENCOUNTER — Other Ambulatory Visit (HOSPITAL_COMMUNITY): Payer: Self-pay

## 2021-09-03 ENCOUNTER — Telehealth: Payer: Self-pay

## 2021-09-03 NOTE — Telephone Encounter (Signed)
Pt in clinic today for 2 wk f/u Ninlaro w/Melissa P,NP. Pt is doing ok. He is fatigued easily. He takes the Automatic Data @ 12 noon without food each Saturday for 3 weeks, then off 1 Sat. He denies missed doses. He is taking an antiemetic with the Ninlaro to prevent any nausea. No fevers, and skin rashes/itching.  Pt reminded to call us if he develops temp of 100.4 or higher, day or night. He verbalized understanding. ?

## 2021-09-04 ENCOUNTER — Other Ambulatory Visit (HOSPITAL_COMMUNITY): Payer: Self-pay

## 2021-09-06 ENCOUNTER — Other Ambulatory Visit (HOSPITAL_COMMUNITY): Payer: Self-pay

## 2021-09-09 ENCOUNTER — Other Ambulatory Visit: Payer: Self-pay | Admitting: Hematology and Oncology

## 2021-09-09 ENCOUNTER — Telehealth: Payer: Self-pay

## 2021-09-09 DIAGNOSIS — E876 Hypokalemia: Secondary | ICD-10-CM

## 2021-09-09 NOTE — Telephone Encounter (Signed)
I spoke with pt. He just got back from taking his brother to have eye surgery. Pt will restart the Ninlaro on 4/29/20023. No missed doses. Pt denies N/V, diarrhea, fevers, & skin rashes/itching. He does report pain in lower back on left side. He has a neurosurgeon appt in Sept 2023. I encouraged him to call them and make them aware of his pain. He stated he would. I confirmed next 2 appt's for pt. ? ?

## 2021-09-10 ENCOUNTER — Encounter: Payer: Self-pay | Admitting: Hematology and Oncology

## 2021-09-10 ENCOUNTER — Inpatient Hospital Stay (INDEPENDENT_AMBULATORY_CARE_PROVIDER_SITE_OTHER): Payer: BC Managed Care – PPO | Admitting: Hematology and Oncology

## 2021-09-10 VITALS — BP 144/78 | HR 88 | Temp 98.1°F | Resp 20 | Ht 66.9 in | Wt 232.9 lb

## 2021-09-10 DIAGNOSIS — C9000 Multiple myeloma not having achieved remission: Secondary | ICD-10-CM

## 2021-09-10 DIAGNOSIS — C9030 Solitary plasmacytoma not having achieved remission: Secondary | ICD-10-CM

## 2021-09-10 NOTE — Assessment & Plan Note (Signed)
Multiple myeloma with evidence of progression in January.  He was initially placed on Revlimid, but switched to Ninlaro due to side effects.  He is tolerating Ninlaro well.  He has new onset mid back pain, as well as low back pain with radiation down the right leg.  Due to his previous history of plasmacytoma and T11 corpectomy/fusion, I will obtain a MRI of the thoracic and lumbar spine for further evaluation.  We will plan to contact him with the results.  He will keep his appointments in May as scheduled. ?

## 2021-09-10 NOTE — Progress Notes (Signed)
?Andrew Espinoza  ?[address] ?[place],  [place]  [address] ?[phone] ? ?Clinic Day:  09/10/2021 ? ?Referring physician: Angelina Sheriff, MD ? ?ASSESSMENT & PLAN:  ? ?Assessment & Plan: ?Multiple myeloma ([place]) ?Multiple myeloma with evidence of progression in January.  He was initially placed on Revlimid, but switched to Ninlaro due to side effects.  He is tolerating Ninlaro well.  He has new onset mid back pain, as well as low back pain with radiation down the right leg.  Due to his previous history of plasmacytoma and T11 corpectomy/fusion, I will obtain a MRI of the thoracic and lumbar spine for further evaluation.  We will plan to contact him with the results.  He will keep his appointments in May as scheduled. ?  ? ?The patient understands the plans discussed today and is in agreement with them.  He knows to contact our office if he develops concerns prior to his next appointment. ? ? ?I provided 20 minutes of face-to-face time during this encounter and > 50% was spent counseling as documented under my assessment and plan.  ? ? ?Andrew Pickles, PA-C  ?[place] ?[place] ?Andrew Espinoza [address] ?Dept: [phone] ?Dept Fax: [phone]  ? ?Orders Placed This Encounter  ?Procedures  ? MR Thoracic Spine W Contrast  ?  Standing Status:   Future  ?  Standing Expiration Date:   09/10/2022  ?  Order Specific Question:   If indicated for the ordered procedure, I authorize the administration of contrast media per Radiology protocol  ?  Answer:   Yes  ?  Order Specific Question:   What is the patient's sedation requirement?  ?  Answer:   No Sedation  ?  Order Specific Question:   Does the patient have a pacemaker or implanted devices?  ?  Answer:   No  ?  Order Specific Question:   Preferred imaging location?  ?  Answer:   External  ? MR Lumbar Spine W Contrast  ?  Standing Status:   Future  ?  Standing  Expiration Date:   09/10/2022  ?  Order Specific Question:   If indicated for the ordered procedure, I authorize the administration of contrast media per Radiology protocol  ?  Answer:   Yes  ?  Order Specific Question:   What is the patient's sedation requirement?  ?  Answer:   No Sedation  ?  Order Specific Question:   Does the patient have a pacemaker or implanted devices?  ?  Answer:   No  ?  Order Specific Question:   Preferred imaging location?  ?  Answer:   External  ?  ? ? ?CHIEF COMPLAINT:  ?CC: Mid and low back pain ? ?Current Treatment:  Nilaro 4 mg weekly ? ?HISTORY OF PRESENT ILLNESS:  ?The patient is a 66 y.o. 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 This is based upon his marrow having >10% plasma cells.  Cytogenetics were normal, but the myeloma FISH panel could not be done due to insufficient quantity.  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  He received postoperative radiation to that area.  The patient received 2 cycles of RVD therapy before stopping therapy for numerous months.  He restarted Revlimid at 10 mg daily in January due to labs  showing disease recurrence.  He was seen in March and reported worsening jawbone issues which he attributed Revlimid.  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  Of note, the patient was previously taking Zometa when his multiple myeloma therapy was initially started.  However, Zometa has since been discontinued after the osteonecrosis was appreciated.  He was therefore switched to Nilaro (ixazomib) 4 mg once weekly every 3 out of 4 weeks.  He was seen on April 14th for routine follow up and was doing fairly well. ? ? ?Oncology History  ?Multiple myeloma ([place])  ?10/24/2020 Cancer Staging  ? Staging form: Plasma Cell Myeloma and Plasma Cell Disorders, AJCC 8th Edition ?- Clinical stage from 10/24/2020: RISS Stage I  (Beta-2-microglobulin (mg/L): 2.5, Albumin (g/dL): 4.1, ISS: Stage I, High-risk cytogenetics: Absent, LDH: Normal) - Signed by Marice Potter, MD on 12/31/2020 ?Histopathologic type: Multiple myeloma ?Stage prefix: Initial diagnosis ?Beta 2 microglobulin range (mg/L): Less than 3.5 ?Albumin range (g/dL): Greater than or equal to 3.5 ?Cytogenetics: No abnormalities ? ?  ?11/07/2020 Initial Diagnosis  ? Multiple myeloma ([place]) ? ?  ?11/20/2020 -  Chemotherapy  ?  Patient is on Treatment Plan: MYELOMA  RVD SQ Q21D X 4 CYCLES ? ?  ? ?  ?  ? ? ?INTERVAL HISTORY:  ?Andrew Espinoza is added to the schedule today as he reports new mid back pain, which is intermittent sharp and burning, more pronounced with twisting.  He rates this pain as 7 out of 10.  He also reports low back pain with radiation down the right leg.  His most recent imaging was a skeletal survey in January which revealed small lucent lesions overlying the calvarium unchanged from previous.  Postsurgical changes were seen of T11 corpectomy with T11 strut graft and posterior rod and screw fusion hardware from T9-L1.  There is mild to moderate T11 and T12 vertebral body height loss.  No new suspicious lytic lesions were seen.  He is using cyclobenzaprine, as well as hydrocodone/APAP 10/325 3 times daily, which helps his low back pain, but not mid back pain.  He denies recent injury.  He states he continues Ninlaro weekly without significant difficulty.  He denies fevers or chills. He denies pain. His appetite is good. His weight has increased 6 pounds over last 10 days . ? ?REVIEW OF SYSTEMS:  ?Review of Systems  ?Constitutional:  Positive for fatigue. Negative for appetite change, chills, fever and unexpected weight change.  ?HENT:   Negative for lump/mass, mouth sores and sore throat.   ?Respiratory:  Negative for cough and shortness of breath.   ?Cardiovascular:  Negative for chest pain and leg swelling.  ?Gastrointestinal:  Negative for abdominal pain, constipation,  diarrhea, nausea and vomiting.  ?Genitourinary:  Negative for difficulty urinating, dysuria, frequency and hematuria.   ?Musculoskeletal:  Positive for back pain. Negative for arthralgias and myalgias.  ?Skin:  Negative for itching, rash and wound.  ?Neurological:  Negative for dizziness, extremity weakness, headaches, light-headedness and numbness.  ?Hematological:  Negative for adenopathy.  ?Psychiatric/Behavioral:  Negative for depression and sleep disturbance. The patient is not nervous/anxious.    ? ?VITALS:  ?Blood pressure (!) 144/78, pulse 88, temperature 98.1 ?F (36.7 ?C), temperature source Oral, resp. rate 20, height 5' 6.9" (1.699 m), weight 232 lb 14.4 oz (105.6 kg), SpO2 98 %.  ?Wt Readings from Last 3 Encounters:  ?09/10/21 232 lb 14.4 oz (105.6 kg)  ?08/30/21 227 lb 1.6 oz (103 kg)  ?  08/02/21 220 lb 1.6 oz (99.8 kg)  ?  ?Body mass index is 36.59 kg/m?. ? ?Performance status (ECOG): 1 - Symptomatic but completely ambulatory ? ?PHYSICAL EXAM:  ?Physical Exam ?Vitals and nursing note reviewed.  ?Constitutional:   ?   General: He is not in acute distress. ?   Appearance: Normal appearance. He is normal weight.  ?HENT:  ?   Head: Normocephalic and atraumatic.  ?Eyes:  ?   General: No scleral icterus. ?   Extraocular Movements: Extraocular movements intact.  ?   Conjunctiva/sclera: Conjunctivae normal.  ?   Pupils: Pupils are equal, round, and reactive to light.  ?Cardiovascular:  ?   Rate and Rhythm: Normal rate and regular rhythm.  ?   Heart sounds: Normal heart sounds. No murmur heard. ?  No friction rub. No gallop.  ?Pulmonary:  ?   Effort: Pulmonary effort is normal.  ?   Breath sounds: Normal breath sounds. No wheezing, rhonchi or rales.  ?Musculoskeletal:     ?   General: Tenderness (mid to lower thoracic area just left of spine) present. Normal range of motion.  ?   Cervical back: Normal range of motion and neck supple.  ?   Right lower leg: No edema.  ?   Left lower leg: No edema.  ?Skin: ?    General: Skin is warm and dry.  ?   Coloration: Skin is not jaundiced.  ?   Findings: No rash.  ?Neurological:  ?   Mental Status: He is alert and oriented to person, place, and time.  ?   Cranial Nerves: No crania

## 2021-09-11 ENCOUNTER — Encounter: Payer: Self-pay | Admitting: Oncology

## 2021-09-13 DIAGNOSIS — G4733 Obstructive sleep apnea (adult) (pediatric): Secondary | ICD-10-CM | POA: Diagnosis not present

## 2021-09-16 DIAGNOSIS — M546 Pain in thoracic spine: Secondary | ICD-10-CM | POA: Diagnosis not present

## 2021-09-16 DIAGNOSIS — C9030 Solitary plasmacytoma not having achieved remission: Secondary | ICD-10-CM | POA: Diagnosis not present

## 2021-09-16 DIAGNOSIS — M545 Low back pain, unspecified: Secondary | ICD-10-CM | POA: Diagnosis not present

## 2021-09-17 ENCOUNTER — Encounter: Payer: Self-pay | Admitting: Hematology and Oncology

## 2021-09-20 ENCOUNTER — Telehealth: Payer: Self-pay

## 2021-09-20 NOTE — Telephone Encounter (Signed)
Elana Jian,RN - I spoke with pt and notified him of pharmacists recommendation below. Pt verbalized understanding.  I asked pt how he was doing with the Ninlaro. He continues to take the [facility] on Saturday @ 12n without food. He did not take a dose on 09/14/2021. He will take a dose tomorrow, 09/21/2021, which is the start of cycle 2. Pt admits to fatigue. He denies other side effects from the [facility]. I confirmed next appt with pt. ? ?Drema Halon, [facility] - That is perfect thank you!! I will follow up with the patient on Monday to see how he is doing! ? ?Meredith Leeds, RPH: How long is he going to be taking Clindamycin? I don't see any specific interactions with taking those together. Although, Clindamycin can be hard on the stomach and also cause diarrhea, which can also be side effects of his Ninlaro. He should be aware this can occur. If he takes the Clindamycin with food, it may cause let stomach upset. The Ninlaro should continue to be taken on an empty stomach. I am going to attach Joellen Jersey (his oral chemo pharmacist) to see if she wants to add anything else and so that she is aware. ? ?I sent message to [facility], pharmacist, to ask her about the clindamycin and Ninlaro. I'll await her response. ?

## 2021-09-24 ENCOUNTER — Other Ambulatory Visit: Payer: Self-pay | Admitting: Hematology and Oncology

## 2021-09-24 DIAGNOSIS — C9000 Multiple myeloma not having achieved remission: Secondary | ICD-10-CM

## 2021-09-24 DIAGNOSIS — D492 Neoplasm of unspecified behavior of bone, soft tissue, and skin: Secondary | ICD-10-CM

## 2021-09-24 MED ORDER — HYDROCODONE-ACETAMINOPHEN 10-325 MG PO TABS: 1.0000 | ORAL_TABLET | ORAL | 0 refills | Status: DC | PRN

## 2021-09-25 ENCOUNTER — Other Ambulatory Visit: Payer: Self-pay | Admitting: Hematology and Oncology

## 2021-09-25 DIAGNOSIS — C9000 Multiple myeloma not having achieved remission: Secondary | ICD-10-CM

## 2021-09-26 ENCOUNTER — Inpatient Hospital Stay: Payer: BC Managed Care – PPO | Attending: Oncology

## 2021-09-26 ENCOUNTER — Other Ambulatory Visit: Payer: Self-pay

## 2021-09-26 DIAGNOSIS — C9000 Multiple myeloma not having achieved remission: Secondary | ICD-10-CM | POA: Diagnosis not present

## 2021-09-26 LAB — CBC AND DIFFERENTIAL
HCT: 40 — AB (ref 41–53)
Hemoglobin: 13 — AB (ref 13.5–17.5)
Neutrophils Absolute: 3.36
Platelets: 174 10*3/uL (ref 150–400)
WBC: 5.7

## 2021-09-26 LAB — BASIC METABOLIC PANEL
BUN: 11 (ref 4–21)
CO2: 32 — AB (ref 13–22)
Chloride: 100 (ref 99–108)
Creatinine: 1 (ref 0.6–1.3)
Glucose: 108
Potassium: 3.9 mEq/L (ref 3.5–5.1)
Sodium: 139 (ref 137–147)

## 2021-09-26 LAB — HEPATIC FUNCTION PANEL
ALT: 20 U/L (ref 10–40)
AST: 43 — AB (ref 14–40)
Alkaline Phosphatase: 67 (ref 25–125)
Bilirubin, Total: 0.4

## 2021-09-26 LAB — COMPREHENSIVE METABOLIC PANEL
Albumin: 4.4 (ref 3.5–5.0)
Calcium: 9.3 (ref 8.7–10.7)

## 2021-09-26 LAB — CBC: RBC: 4.4 (ref 3.87–5.11)

## 2021-09-27 ENCOUNTER — Telehealth: Payer: Self-pay

## 2021-09-27 LAB — KAPPA/LAMBDA LIGHT CHAINS
Kappa free light chain: 32.6 mg/L — ABNORMAL HIGH (ref 3.3–19.4)
Kappa, lambda light chain ratio: 0.95 (ref 0.26–1.65)
Lambda free light chains: 34.2 mg/L — ABNORMAL HIGH (ref 5.7–26.3)

## 2021-09-27 NOTE — Telephone Encounter (Signed)
Pt returned my call. Pt states, "I feel good. I feel like I got my life back since I quit that Revlimid". He denies N/V, diarrhea, rashes/itching, mouth sores and fever. I reminded pt to call us if he develops temp of 100.4 or higher day or night. He verbalized understanding. He mentioned he has been visiting with his brothers and sisters weekly. I confirmed next appt.

## 2021-10-01 LAB — MULTIPLE MYELOMA PANEL, SERUM
Albumin SerPl Elph-Mcnc: 3.9 g/dL (ref 2.9–4.4)
Albumin/Glob SerPl: 1.6 (ref 0.7–1.7)
Alpha 1: 0.3 g/dL (ref 0.0–0.4)
Alpha2 Glob SerPl Elph-Mcnc: 0.6 g/dL (ref 0.4–1.0)
B-Globulin SerPl Elph-Mcnc: 0.9 g/dL (ref 0.7–1.3)
Gamma Glob SerPl Elph-Mcnc: 0.9 g/dL (ref 0.4–1.8)
Globulin, Total: 2.6 g/dL (ref 2.2–3.9)
IgA: 219 mg/dL (ref 61–437)
IgG (Immunoglobin G), Serum: 957 mg/dL (ref 603–1613)
IgM (Immunoglobulin M), Srm: 24 mg/dL (ref 20–172)
M Protein SerPl Elph-Mcnc: 0.2 g/dL — ABNORMAL HIGH
Total Protein ELP: 6.5 g/dL (ref 6.0–8.5)

## 2021-10-01 LAB — IGG, IGA, IGM
IgA: 242 mg/dL (ref 61–437)
IgG (Immunoglobin G), Serum: 935 mg/dL (ref 603–1613)
IgM (Immunoglobulin M), Srm: 18 mg/dL — ABNORMAL LOW (ref 20–172)

## 2021-10-02 ENCOUNTER — Other Ambulatory Visit: Payer: Self-pay

## 2021-10-02 ENCOUNTER — Other Ambulatory Visit (HOSPITAL_COMMUNITY): Payer: Self-pay

## 2021-10-02 ENCOUNTER — Encounter: Payer: Self-pay | Admitting: Hematology and Oncology

## 2021-10-02 ENCOUNTER — Inpatient Hospital Stay (INDEPENDENT_AMBULATORY_CARE_PROVIDER_SITE_OTHER): Payer: BC Managed Care – PPO | Admitting: Hematology and Oncology

## 2021-10-02 ENCOUNTER — Ambulatory Visit: Payer: BC Managed Care – PPO | Admitting: Oncology

## 2021-10-02 VITALS — BP 153/82 | HR 78 | Temp 98.2°F | Resp 16 | Ht 66.9 in | Wt 235.3 lb

## 2021-10-02 DIAGNOSIS — C9000 Multiple myeloma not having achieved remission: Secondary | ICD-10-CM

## 2021-10-02 NOTE — Progress Notes (Cosign Needed)
?[place]  ?[address] ?[place],  [place]  [address] ?[phone] ? ?Clinic Day:  10/02/2021 ? ?Referring physician: Angelina Sheriff, MD ? ? ?HISTORY OF PRESENT ILLNESS:  ?The patient is a 66 y.o. 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 This is based upon his marrow having >10% plasma cells.  Cytogenetics were normal, but the myeloma FISH panel could not be done due to insufficient quantity.  The patient received 2 cycles of RVD therapy before stopping therapy for numerous months.  He restarted Revlimid at 10 mg daily in January due to labs showing disease recurrence.  He developed osteonecrosis of the jaw and was previously taking Zometa when his multiple myeloma therapy was initially started.  However, Zometa was discontinued after the osteonecrosis was appreciated.  However, patient had persistent jawbone issues.  He attributed this to Revlimid, so his oral therapy was switched to [facility] in March.  He was seen off schedule at the end of April due to increasing back pain.  MRI lumbar and thoracic spine did not reveal any evidence of plasmacytoma or lytic lesions.  He has significant degenerative disease, so I recommended he see his surgeon.  He comes in today to review his most recent myeloma parameters.  The patient claims to be tolerating weekly Ninlaro without difficulty.  He has occasional diarrhea for which Imodium is effective.  He reports sores of the skin of his right shoulder and proximal arm, which he has been scratching.  He states it rarely itches.  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  He has not seen Dr. Christella Noa yet.  He continues hydrocodone/APAP 10/325 and cyclobenzaprine 10 mg 3 times a day as needed for pain.  He states his pain is well controlled at this time. ? ?PHYSICAL EXAM:  ?Blood pressure (!) 153/82, pulse 78, temperature 98.2 ?F (36.8 ?C), resp. rate 16, height  5' 6.9" (1.699 m), weight 235 lb 4.8 oz (106.7 kg), SpO2 96 %. ?Wt Readings from Last 3 Encounters:  ?10/02/21 235 lb 4.8 oz (106.7 kg)  ?09/10/21 232 lb 14.4 oz (105.6 kg)  ?08/30/21 227 lb 1.6 oz (103 kg)  ? ?Body mass index is 36.96 kg/m?. ? ?Performance status (ECOG): 1 - Symptomatic but completely ambulatory ? ?Physical Exam ?Vitals and nursing note reviewed.  ?Constitutional:   ?   General: He is not in acute distress. ?   Appearance: Normal appearance. He is normal weight.  ?HENT:  ?   Head: Normocephalic and atraumatic.  ?   Mouth/Throat:  ?   Mouth: Mucous membranes are moist.  ?   Pharynx: Oropharynx is clear. No oropharyngeal exudate or posterior oropharyngeal erythema.  ?Eyes:  ?   General: No scleral icterus. ?   Extraocular Movements: Extraocular movements intact.  ?   Conjunctiva/sclera: Conjunctivae normal.  ?   Pupils: Pupils are equal, round, and reactive to light.  ?Cardiovascular:  ?   Rate and Rhythm: Normal rate and regular rhythm.  ?   Heart sounds: Normal heart sounds. No murmur heard. ?  No friction rub. No gallop.  ?Pulmonary:  ?   Effort: Pulmonary effort is normal.  ?   Breath sounds: Normal breath sounds. No wheezing, rhonchi or rales.  ?Abdominal:  ?   General: Bowel sounds are normal. There is no distension.  ?   Palpations: Abdomen is soft. There is no hepatomegaly, splenomegaly  or mass.  ?   Tenderness: There is no abdominal tenderness.  ?Musculoskeletal:     ?   General: Normal range of motion.  ?   Cervical back: Normal range of motion and neck supple. No tenderness.  ?   Thoracic back: Tenderness (To the left of the mid thoracic spine) present.  ?   Right lower leg: No edema.  ?   Left lower leg: No edema.  ?Lymphadenopathy:  ?   Cervical: No cervical adenopathy.  ?   Upper Body:  ?   Right upper body: No supraclavicular or axillary adenopathy.  ?   Left upper body: No supraclavicular or axillary adenopathy.  ?   Lower Body: No right inguinal adenopathy. No left inguinal  adenopathy.  ?Skin: ?   General: Skin is warm and dry.  ?   Coloration: Skin is not jaundiced.  ?   Findings: Rash (Multiple excoriated lesions of the right shoulder and proximal arm) present.  ?Neurological:  ?   Mental Status: He is alert and oriented to person, place, and time.  ?   Cranial Nerves: No cranial nerve deficit.  ?Psychiatric:     ?   Mood and Affect: Mood normal.     ?   Behavior: Behavior normal.     ?   Thought Content: Thought content normal.  ? ? ?LABS:  ? ? ?  Latest Ref Rng & Units 09/26/2021  ? 12:00 AM 08/30/2021  ? 12:00 AM 07/26/2021  ? 12:00 AM  ?CBC  ?WBC  5.7      6.8      5.7    ?Hemoglobin 13.5 - 17.5 13.0      13.3      13.2    ?Hematocrit 41 - 53 40      41      41    ?Platelets 150 - 400 K/uL 174      168      240    ?  ? This result is from an external source.  ? ? ?  Latest Ref Rng & Units 09/26/2021  ? 12:00 AM 08/30/2021  ? 12:00 AM 07/26/2021  ? 12:00 AM  ?CMP  ?BUN 4 - $R'21 11      9      8    'SI$ ?Creatinine 0.6 - 1.3 1.0      0.9      0.9    ?Sodium 137 - 147 139      139      140    ?Potassium 3.5 - 5.1 mEq/L 3.9      3.2      2.6    ?Chloride 99 - 108 100      102      101    ?CO2 13 - 22 32      27      34    ?Calcium 8.7 - 10.7 9.3      9.4      8.8    ?Alkaline Phos 25 - 125 67      77      74    ?AST 14 - 40 43      45      37    ?ALT 10 - 40 U/L $Remo'20      19      21    'NrmDr$ ?  ? This result is from an external source.  ? ?Kappa/lambda light chains ?Order: [id number] ?  Status: Final result    ?Visible to patient: No (inaccessible in MyChart)    ?Next appt: Today at 11:00 AM in Oncology Marvia Pickles, PA-C)    ?Dx: Multiple myeloma not having achieved ...    ?0 Result Notes ?        ?Component Ref Range & Units 6 d ago 2 mo ago 4 mo ago 9 mo ago 11 mo ago  ?Kappa free light chain 3.3 - 19.4 mg/L 32.6 High   73.7 High   35.1 High   17.1  21.8 High    ?Lambda free light chains 5.7 - 26.3 mg/L 34.2 High   64.4 High   46.6 High   23.0  215.9 High    ?Kappa, lambda light chain ratio 0.26 -  1.65 0.95  1.14 CM  0.75 CM  0.74 CM  0.10 Low  CM   ?Comment: (NOTE)  ?Performed At: [place]  ?[address] [place], [place] [id number]  ?Rush Farmer MD KT:[id number]   ?  ? ? ?Multiple Myeloma Panel (SPEP&IFE w/QIG) ?Order: [id number] ?Status: Edited Result - FINAL    ?Visible to patient: No (inaccessible in MyChart)    ?Next appt: Today at 11:00 AM in Oncology Marvia Pickles, PA-C)    ?Dx: Multiple myeloma not having achieved ...    ?0 Result Notes ?         ?Component Ref Range & Units 6 d ago ?(09/26/21) 2 mo ago ?(07/26/21) 4 mo ago ?(05/29/21) 9 mo ago ?(12/10/20) 11 mo ago ?(10/24/20) 11 mo ago ?(10/24/20)  ?IgG (Immunoglobulin G), Serum 603 - 1,613 mg/dL 957  1,062  942  499 Low    675   ?IgA 61 - 437 mg/dL 219  314  207  161   170   ?IgM (Immunoglobulin M), Srm 20 - 172 mg/dL 24  24 CM  17 Low  CM  23 CM   20 CM   ?Comment: Result confirmed on concentration.  ?Total Protein ELP 6.0 - 8.5 g/dL 6.5 VC  6.8 VC  6.3 VC  5.7 Low  VC  6.3    ?Albumin SerPl Elph-Mcnc 2.9 - 4.4 g/dL 3.9 VC  3.7 VC  3.7 VC  3.4 VC     ?Alpha 1 0.0 - 0.4 g/dL 0.3 VC  0.3 VC  0.3 VC  0.2 VC     ?Alpha2 Glob SerPl Elph-Mcnc 0.4 - 1.0 g/dL 0.6 VC  0.7 VC  0.6 VC  0.7 VC     ?B-Globulin SerPl Elph-Mcnc 0.7 - 1.3 g/dL 0.9 VC  0.9 VC  0.8 VC  0.9 VC     ?Gamma Glob SerPl Elph-Mcnc 0.4 - 1.8 g/dL 0.9 VC  1.1 VC  0.9 VC  0.4 VC     ?M Protein SerPl Elph-Mcnc Not Observed g/dL 0.2 High  VC  0.2 High  VC  0.3 High  VC  Not Observed VC     ?Globulin, Total 2.2 - 3.9 g/dL 2.6 VC  3.1 VC  2.6 VC  2.3 VC  2.5 VC    ?Albumin/Glob SerPl 0.7 - 1.7 1.6 VC  1.2 VC  1.5 VC  1.5 VC     ?IFE 1  Comment Abnormal  VC  Comment Abnormal  VC, CM  Comment Abnormal  VC, CM  Comment VC, CM     ?Comment: (NOTE)  ?Immunofixation shows IgG monoclonal protein with lambda light chain  ?specificity.   ?Please Note  Comment VC  Comment  VC, CM  Comment VC, CM  Comment VC, CM     ?Comment: (NOTE)  ?Protein electrophoresis scan will follow via computer, mail, or   ?courier delivery.  ?Performed At: [place]  ?[address] [place], [place] [id number]  ?Rush Farmer MD ZR:[id number]   ?Resulting Agency  [facility] [place]

## 2021-10-04 ENCOUNTER — Other Ambulatory Visit: Payer: Self-pay | Admitting: Hematology and Oncology

## 2021-10-04 ENCOUNTER — Other Ambulatory Visit (HOSPITAL_COMMUNITY): Payer: Self-pay

## 2021-10-04 DIAGNOSIS — E876 Hypokalemia: Secondary | ICD-10-CM

## 2021-10-04 MED ORDER — POTASSIUM CHLORIDE CRYS ER 20 MEQ PO TBCR: 20.0000 meq | EXTENDED_RELEASE_TABLET | Freq: Two times a day (BID) | ORAL | 0 refills | Status: DC

## 2021-10-07 ENCOUNTER — Other Ambulatory Visit (HOSPITAL_COMMUNITY): Payer: Self-pay

## 2021-10-09 ENCOUNTER — Encounter: Payer: Self-pay | Admitting: Oncology

## 2021-10-10 ENCOUNTER — Other Ambulatory Visit (HOSPITAL_COMMUNITY): Payer: Self-pay

## 2021-10-13 DIAGNOSIS — G4733 Obstructive sleep apnea (adult) (pediatric): Secondary | ICD-10-CM | POA: Diagnosis not present

## 2021-10-21 ENCOUNTER — Other Ambulatory Visit: Payer: Self-pay

## 2021-10-21 ENCOUNTER — Telehealth: Payer: Self-pay

## 2021-10-21 DIAGNOSIS — M545 Low back pain, unspecified: Secondary | ICD-10-CM

## 2021-10-21 DIAGNOSIS — D492 Neoplasm of unspecified behavior of bone, soft tissue, and skin: Secondary | ICD-10-CM

## 2021-10-21 DIAGNOSIS — C9000 Multiple myeloma not having achieved remission: Secondary | ICD-10-CM

## 2021-10-21 MED ORDER — HYDROCODONE-ACETAMINOPHEN 10-325 MG PO TABS: 1.0000 | ORAL_TABLET | ORAL | 0 refills | Status: DC | PRN

## 2021-10-21 MED ORDER — CYCLOBENZAPRINE HCL 10 MG PO TABS: 10.0000 mg | ORAL_TABLET | Freq: Three times a day (TID) | ORAL | 0 refills | Status: DC | PRN

## 2021-10-21 NOTE — Telephone Encounter (Signed)
Pt called to request refill of hydrocodone and flexeril. Dr Christella Noa (neurosurgeon) has been the one to fill his flexeril in the past, but he only sees him q 6 months. He states Dr Christella Noa told him to ask if we would refill his flexeril when needed. I told him I would send the message to provider.

## 2021-10-22 ENCOUNTER — Encounter: Payer: Self-pay | Admitting: Oncology

## 2021-11-08 ENCOUNTER — Other Ambulatory Visit (HOSPITAL_COMMUNITY): Payer: Self-pay

## 2021-11-11 ENCOUNTER — Other Ambulatory Visit: Payer: Self-pay | Admitting: Hematology and Oncology

## 2021-11-11 ENCOUNTER — Other Ambulatory Visit (HOSPITAL_COMMUNITY): Payer: Self-pay

## 2021-11-11 ENCOUNTER — Telehealth: Payer: Self-pay | Admitting: Pharmacy Technician

## 2021-11-11 MED ORDER — IXAZOMIB CITRATE 4 MG PO CAPS: ORAL_CAPSULE | ORAL | 3 refills | Status: DC

## 2021-11-11 NOTE — Telephone Encounter (Signed)
Spoke to patient, he will gather income documents and will plan to stop by the office to complete patient assistance paperwork

## 2021-11-13 ENCOUNTER — Other Ambulatory Visit: Payer: Self-pay | Admitting: Hematology and Oncology

## 2021-11-13 ENCOUNTER — Telehealth: Payer: Self-pay

## 2021-11-13 DIAGNOSIS — E876 Hypokalemia: Secondary | ICD-10-CM

## 2021-11-13 MED ORDER — TAMSULOSIN HCL 0.4 MG PO CAPS: ORAL_CAPSULE | ORAL | 0 refills | Status: DC

## 2021-11-13 MED ORDER — POTASSIUM CHLORIDE CRYS ER 20 MEQ PO TBCR: 20.0000 meq | EXTENDED_RELEASE_TABLET | Freq: Two times a day (BID) | ORAL | 0 refills | Status: AC

## 2021-11-13 NOTE — Telephone Encounter (Signed)
I called pt to see how he is tolerating the Ninlaro. At last phone visit he was taking the [facility] on Saturdays' @ 12n w/food (for 3 weeks, then off 1 week). He continues to take the [facility] @ 12n w/food. This Sat, 11/16/2021, is his week off. Pt denies emesis. However, he is intermittently experiencing nausea w/breakfast. He does take an antiemetic w/relief. No mouth sores. Pt is having bowel movements is back to normal. No swelling in lower extremities? No fevers. "Everything is better since stopping that Revlimid".  I reminded pt to call us if he develops temp of 100.4 or higher, day or night. He verbalized understanding. I confirmed his next f/u appt is July 10 for labs, then July 17 to see Dr Bobby Rumpf.

## 2021-11-14 NOTE — Progress Notes (Signed)
Sent in application to [facility] for free [employer].

## 2021-11-18 ENCOUNTER — Other Ambulatory Visit (HOSPITAL_COMMUNITY): Payer: Self-pay

## 2021-11-18 ENCOUNTER — Other Ambulatory Visit: Payer: Self-pay | Admitting: Hematology and Oncology

## 2021-11-18 MED ORDER — IXAZOMIB CITRATE 4 MG PO CAPS: ORAL_CAPSULE | ORAL | 3 refills | Status: DC

## 2021-11-18 NOTE — Progress Notes (Signed)
Patient was approved for co-pay assistance through [employer].  05/22/2021 - 0/06/2022 Cardholder: [id number] BIN: [id number] Group: [id number] PCN: [id number] [phone]  Patient gets his medication through [facility]

## 2021-11-25 ENCOUNTER — Inpatient Hospital Stay: Payer: Medicare Other | Attending: Oncology

## 2021-11-25 ENCOUNTER — Other Ambulatory Visit: Payer: Self-pay | Admitting: Hematology and Oncology

## 2021-11-25 DIAGNOSIS — C9000 Multiple myeloma not having achieved remission: Secondary | ICD-10-CM | POA: Diagnosis present

## 2021-11-25 DIAGNOSIS — D492 Neoplasm of unspecified behavior of bone, soft tissue, and skin: Secondary | ICD-10-CM

## 2021-11-25 DIAGNOSIS — G8929 Other chronic pain: Secondary | ICD-10-CM

## 2021-11-25 LAB — COMPREHENSIVE METABOLIC PANEL
Albumin: 4.3 (ref 3.5–5.0)
Calcium: 9.6 (ref 8.7–10.7)

## 2021-11-25 LAB — CBC AND DIFFERENTIAL
HCT: 39 — AB (ref 41–53)
Hemoglobin: 13.2 — AB (ref 13.5–17.5)
Neutrophils Absolute: 4.51
Platelets: 185 10*3/uL (ref 150–400)
WBC: 7.4

## 2021-11-25 LAB — BASIC METABOLIC PANEL
BUN: 14 (ref 4–21)
CO2: 26 — AB (ref 13–22)
Chloride: 103 (ref 99–108)
Creatinine: 0.9 (ref 0.6–1.3)
Glucose: 105
Potassium: 3.6 mEq/L (ref 3.5–5.1)
Sodium: 138 (ref 137–147)

## 2021-11-25 LAB — HEPATIC FUNCTION PANEL
ALT: 22 U/L (ref 10–40)
AST: 48 — AB (ref 14–40)
Alkaline Phosphatase: 65 (ref 25–125)
Bilirubin, Total: 0.3

## 2021-11-25 LAB — CBC: RBC: 4.22 (ref 3.87–5.11)

## 2021-11-25 MED ORDER — CYCLOBENZAPRINE HCL 10 MG PO TABS: 10.0000 mg | ORAL_TABLET | Freq: Three times a day (TID) | ORAL | 0 refills | Status: DC | PRN

## 2021-11-25 MED ORDER — HYDROCODONE-ACETAMINOPHEN 10-325 MG PO TABS: 1.0000 | ORAL_TABLET | ORAL | 0 refills | Status: DC | PRN

## 2021-11-25 NOTE — Telephone Encounter (Signed)
Office was able to obtain grant for patient. PAP application was also submitted.

## 2021-11-26 LAB — KAPPA/LAMBDA LIGHT CHAINS
Kappa free light chain: 31.2 mg/L — ABNORMAL HIGH (ref 3.3–19.4)
Kappa, lambda light chain ratio: 0.83 (ref 0.26–1.65)
Lambda free light chains: 37.5 mg/L — ABNORMAL HIGH (ref 5.7–26.3)

## 2021-11-26 LAB — IGG, IGA, IGM
IgA: 214 mg/dL (ref 61–437)
IgG (Immunoglobin G), Serum: 877 mg/dL (ref 603–1613)
IgM (Immunoglobulin M), Srm: 17 mg/dL — ABNORMAL LOW (ref 20–172)

## 2021-11-29 LAB — MULTIPLE MYELOMA PANEL, SERUM
Albumin SerPl Elph-Mcnc: 4 g/dL (ref 2.9–4.4)
Albumin/Glob SerPl: 1.6 (ref 0.7–1.7)
Alpha 1: 0.2 g/dL (ref 0.0–0.4)
Alpha2 Glob SerPl Elph-Mcnc: 0.6 g/dL (ref 0.4–1.0)
B-Globulin SerPl Elph-Mcnc: 0.9 g/dL (ref 0.7–1.3)
Gamma Glob SerPl Elph-Mcnc: 0.8 g/dL (ref 0.4–1.8)
Globulin, Total: 2.6 g/dL (ref 2.2–3.9)
IgA: 210 mg/dL (ref 61–437)
IgG (Immunoglobin G), Serum: 857 mg/dL (ref 603–1613)
IgM (Immunoglobulin M), Srm: 15 mg/dL — ABNORMAL LOW (ref 20–172)
M Protein SerPl Elph-Mcnc: 0.3 g/dL — ABNORMAL HIGH
Total Protein ELP: 6.6 g/dL (ref 6.0–8.5)

## 2021-12-01 NOTE — Progress Notes (Signed)
Andrew Espinoza  [address] [place],  [place]  [address] [phone]  Clinic Day:  12/02/2021  Referring physician: Angelina Sheriff, MD  HISTORY OF PRESENT ILLNESS:  The patient is a 66 y.o. 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 He currently takes Ninlaro weekly, every 3 out of 4 weeks.  He comes in today for routine follow-up.  Since his last visit, the patient has been doing okay.  He claims to have involuntary musculoskeletal spasms, which apparently never happened before until exacerbated was started.  He also has occasional skin lesions, which he attributes to his Ninlaro therapy.  Despite these issues, his daily quality of life remains fine.  He denies having any new symptoms or findings which concern him for overt progression of his multiple myeloma.    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  A bone marrow done in June 2022 showed 14-15% plasma cells.  Cytogenetics were normal, but the myeloma FISH panel could not be done due to insufficient quantity.  The patient received 2 cycles of RVD therapy before stopping therapy for numerous months.  He restarted Revlimid at 10 mg daily in January 2023 due to labs showing disease recurrence.  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  Due to jaw issues and other problems the patient claims was from from Revlimid, he was switched to Ixazomib in March 2023, which he continues take.    PHYSICAL EXAM:  Blood pressure (!) 165/92, pulse 78, temperature 98.3 F (36.8 C), resp. rate 16, height 5' 6.9" (1.699 m), weight 246 lb 12.8 oz (111.9 kg), SpO2 96 %. Wt Readings from Last 3 Encounters:  12/02/21 246 lb 12.8 oz (111.9 kg)  10/02/21 235 lb 4.8 oz (106.7 kg)  09/10/21 232 lb 14.4 oz (105.6 kg)   Body mass index is 38.77  kg/m.  Performance status (ECOG): 1 - Symptomatic but completely ambulatory  Physical Exam Vitals and nursing note reviewed.  Constitutional:      General: He is not in acute distress.    Appearance: Normal appearance. He is normal weight.  HENT:     Head: Normocephalic and atraumatic.     Mouth/Throat:     Mouth: Mucous membranes are moist.     Pharynx: Oropharynx is clear. No oropharyngeal exudate or posterior oropharyngeal erythema.  Eyes:     General: No scleral icterus.    Extraocular Movements: Extraocular movements intact.     Conjunctiva/sclera: Conjunctivae normal.     Pupils: Pupils are equal, round, and reactive to light.  Cardiovascular:     Rate and Rhythm: Normal rate and regular rhythm.     Heart sounds: Normal heart sounds. No murmur heard.    No friction rub. No gallop.  Pulmonary:     Effort: Pulmonary effort is normal.     Breath sounds: Normal breath sounds. No wheezing, rhonchi or rales.  Abdominal:     General: Bowel sounds are normal. There is no distension.     Palpations: Abdomen is soft. There is no hepatomegaly, splenomegaly or mass.     Tenderness: There is no abdominal tenderness.  Musculoskeletal:        General: Normal range of motion.     Cervical back: Normal range of motion and neck supple. No tenderness.  Thoracic back: No tenderness.     Right lower leg: No edema.     Left lower leg: No edema.  Lymphadenopathy:     Cervical: No cervical adenopathy.     Upper Body:     Right upper body: No supraclavicular or axillary adenopathy.     Left upper body: No supraclavicular or axillary adenopathy.     Lower Body: No right inguinal adenopathy. No left inguinal adenopathy.  Skin:    General: Skin is warm and dry.     Coloration: Skin is not jaundiced.     Findings: Rash (Multiple excoriations of the right shoulder and proximal arm) present.  Neurological:     Mental Status: He is alert and oriented to person, place, and time.     Cranial  Nerves: No cranial nerve deficit.  Psychiatric:        Mood and Affect: Mood normal.        Behavior: Behavior normal.        Thought Content: Thought content normal.    LABS:      Latest Ref Rng & Units 11/25/2021   12:00 AM 09/26/2021   12:00 AM 08/30/2021   12:00 AM  CBC  WBC  7.4     5.7     6.8      Hemoglobin 13.5 - 17.5 13.2     13.0     13.3      Hematocrit 41 - 53 39     40     41      Platelets 150 - 400 K/uL 185     174     168         This result is from an external source.      Latest Ref Rng & Units 11/25/2021   12:00 AM 09/26/2021   12:00 AM 08/30/2021   12:00 AM  CMP  BUN 4 - $R'21 14     11     9      'II$ Creatinine 0.6 - 1.3 0.9     1.0     0.9      Sodium 137 - 147 138     139     139      Potassium 3.5 - 5.1 mEq/L 3.6     3.9     3.2      Chloride 99 - 108 103     100     102      CO2 13 - 22 26     32     27      Calcium 8.7 - 10.7 9.6     9.3     9.4      Alkaline Phos 25 - 125 65     67     77      AST 14 - 40 48     43     45      ALT 10 - 40 U/L $Remo'22     20     19         'tVBfL$ This result is from an external source.    Latest Reference Range & Units 09/26/21 10:49 11/25/21 11:57  M Protein SerPl Elph-Mcnc Not Observed g/dL 0.2 (H) (C) 0.3 (H) (C)  (H): Data is abnormally high (C): Corrected  Latest Reference Range & Units 09/26/21 10:49 11/25/21 11:57  IgG (Immunoglobin G), Serum 603 - 1,613 mg/dL 603 - 1,613 mg/dL [phone] 877  IgM (Immunoglobulin M), Srm 20 - 172 mg/dL 20 - 172 mg/dL 18 (L) 24 15 (L) 17 (L)  IgA 61 - 437 mg/dL 61 - 437 mg/dL 242 219 210 214  (L): Data is abnormally low  Latest Reference Range & Units 09/26/21 10:49 11/25/21 11:57  Kappa free light chain 3.3 - 19.4 mg/L 32.6 (H) 31.2 (H)  Lambda free light chains 5.7 - 26.3 mg/L 34.2 (H) 37.5 (H)  (H): Data is abnormally high  ASSESSMENT & PLAN:  Assessment/Plan:  A 66 y.o. 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  His most recent myeloma parameters show a  mild increase in his lambda light chain.  His M-spike has also minimally increased.  Overall, there are no significant rise in his myeloma parameters to think a change in therapy is necessary.  He will continue  ixazomib $RemoveB'4mg'bgEpLyOT$  once weekly every 3 out of 4 weeks.  I will see him back in 8 weeks for repeat clinical assessment.  The patient understands all the plans discussed today and is in agreement with them.    Tanganyika Bowlds Macarthur Critchley, MD

## 2021-12-02 ENCOUNTER — Inpatient Hospital Stay (INDEPENDENT_AMBULATORY_CARE_PROVIDER_SITE_OTHER): Payer: Medicare Other | Admitting: Oncology

## 2021-12-02 ENCOUNTER — Telehealth: Payer: Self-pay | Admitting: Oncology

## 2021-12-02 ENCOUNTER — Other Ambulatory Visit: Payer: Self-pay | Admitting: Oncology

## 2021-12-02 VITALS — BP 165/92 | HR 78 | Temp 98.3°F | Resp 16 | Ht 66.9 in | Wt 246.8 lb

## 2021-12-02 DIAGNOSIS — C9000 Multiple myeloma not having achieved remission: Secondary | ICD-10-CM | POA: Diagnosis not present

## 2021-12-02 NOTE — Telephone Encounter (Signed)
Per 12/02/21 los next appt sched and confirmed with patient

## 2021-12-26 ENCOUNTER — Other Ambulatory Visit: Payer: Self-pay | Admitting: Hematology and Oncology

## 2021-12-26 DIAGNOSIS — D492 Neoplasm of unspecified behavior of bone, soft tissue, and skin: Secondary | ICD-10-CM

## 2021-12-26 DIAGNOSIS — M545 Low back pain, unspecified: Secondary | ICD-10-CM

## 2021-12-26 DIAGNOSIS — C9000 Multiple myeloma not having achieved remission: Secondary | ICD-10-CM

## 2021-12-26 MED ORDER — CYCLOBENZAPRINE HCL 10 MG PO TABS: 10.0000 mg | ORAL_TABLET | Freq: Three times a day (TID) | ORAL | 0 refills | Status: DC | PRN

## 2021-12-26 MED ORDER — HYDROCODONE-ACETAMINOPHEN 10-325 MG PO TABS: 1.0000 | ORAL_TABLET | ORAL | 0 refills | Status: DC | PRN

## 2022-01-21 ENCOUNTER — Other Ambulatory Visit: Payer: Self-pay

## 2022-01-21 ENCOUNTER — Inpatient Hospital Stay: Payer: Medicare Other | Attending: Oncology

## 2022-01-21 ENCOUNTER — Telehealth: Payer: Self-pay

## 2022-01-21 DIAGNOSIS — C9000 Multiple myeloma not having achieved remission: Secondary | ICD-10-CM | POA: Insufficient documentation

## 2022-01-21 LAB — BASIC METABOLIC PANEL
BUN: 12 (ref 4–21)
CO2: 29 — AB (ref 13–22)
Chloride: 98 — AB (ref 99–108)
Creatinine: 0.9 (ref 0.6–1.3)
Glucose: 92
Potassium: 3 mEq/L — AB (ref 3.5–5.1)
Sodium: 135 — AB (ref 137–147)

## 2022-01-21 LAB — HEPATIC FUNCTION PANEL
ALT: 28 U/L (ref 10–40)
AST: 48 — AB (ref 14–40)
Alkaline Phosphatase: 67 (ref 25–125)
Bilirubin, Total: 0.4

## 2022-01-21 LAB — CBC AND DIFFERENTIAL
HCT: 40 — AB (ref 41–53)
Hemoglobin: 13.5 (ref 13.5–17.5)
Neutrophils Absolute: 5.76
Platelets: 229 10*3/uL (ref 150–400)
WBC: 8.6

## 2022-01-21 LAB — COMPREHENSIVE METABOLIC PANEL
Albumin: 4.5 (ref 3.5–5.0)
Calcium: 9.4 (ref 8.7–10.7)

## 2022-01-21 LAB — CBC: RBC: 4.31 (ref 3.87–5.11)

## 2022-01-21 NOTE — Telephone Encounter (Signed)
Pt came into clinic to give [place] an update on his jaw. He began having jaw pain and swelling on left side. He was seen @ [facility] in [place]. He was given Clindamycin 3 tabs po TID, hydrocodone 10/325 for pain, & was told to stay on liquid diet. The ER doctor wanted him to be seen for second opinion. Pt was sent to [facility] and CT done 2 days later confirmed that pt has pathological fracture of the left mandible. Pt is scheduled for surgery tomorrow, 01/22/2022 @ [facility]. Dr Bobby Rumpf has spoken with the oral surgeon @ [facility] today, 01/21/2022. Pt is to stop Ninlaro until oral surgeon gives him the ok to restart.  Pt called and told to stop Ninlaro until further notice. Pt verbalized understanding.

## 2022-01-21 NOTE — Telephone Encounter (Signed)
error 

## 2022-01-22 ENCOUNTER — Inpatient Hospital Stay: Payer: Medicare Other

## 2022-01-23 LAB — KAPPA/LAMBDA LIGHT CHAINS
Kappa free light chain: 24.3 mg/L — ABNORMAL HIGH (ref 3.3–19.4)
Kappa, lambda light chain ratio: 0.58 (ref 0.26–1.65)
Lambda free light chains: 41.6 mg/L — ABNORMAL HIGH (ref 5.7–26.3)

## 2022-01-27 NOTE — Progress Notes (Signed)
[facility]  [address] [place],  [place]  [address] [phone]  Clinic Day:  01/28/2022  Referring physician: Angelina Sheriff, MD  HISTORY OF PRESENT ILLNESS:  The patient is a 66 y.o. 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 He currently takes Ninlaro weekly, every 3 out of 4 weeks.  He comes in today for routine follow-up.  Since his last visit, the patient has been doing okay.  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  He is currently on 2 antibiotics to treat this bacterial infection.  As a pertains to his multiple myeloma, he denies having any bone pain, fatigue, or other symptoms which concern him for disease progression while on his Ninlaro oral therapy.  Of note, he has been off Ninlaro  over the past few weeks to prevent wound healing from his recent general surgery.  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  A bone marrow done in June 2022 showed 14-15% plasma cells.  Cytogenetics were normal, but the myeloma FISH panel could not be done due to insufficient quantity.  The patient received 2 cycles of RVD therapy before stopping therapy for numerous months.  He restarted Revlimid at 10 mg daily in January 2023 due to labs showing disease recurrence.  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  Due to jaw issues and other problems the patient claims was from from Revlimid, he was switched to Ixazomib in March 2023, which he continues take.    PHYSICAL EXAM:  Blood pressure (!) 143/90, pulse 68, temperature 98 F (36.7 C), resp. rate 18, height 5' 6.9" (1.699 m), weight 229 lb 9.6 oz (104.1 kg), SpO2 94 %. Wt Readings from Last 3 Encounters:  01/28/22 229 lb 9.6 oz (104.1 kg)   12/02/21 246 lb 12.8 oz (111.9 kg)  10/02/21 235 lb 4.8 oz (106.7 kg)   Body mass index is 36.07 kg/m.  Performance status (ECOG): 1 - Symptomatic but completely ambulatory  Physical Exam Vitals and nursing note reviewed.  Constitutional:      General: He is not in acute distress.    Appearance: Normal appearance. He is normal weight.  HENT:     Head: Normocephalic and atraumatic.     Mouth/Throat:     Mouth: Mucous membranes are moist.     Pharynx: Oropharynx is clear. No oropharyngeal exudate or posterior oropharyngeal erythema.  Eyes:     General: No scleral icterus.    Extraocular Movements: Extraocular movements intact.     Conjunctiva/sclera: Conjunctivae normal.     Pupils: Pupils are equal, round, and reactive to light.  Cardiovascular:     Rate and Rhythm: Normal rate and regular rhythm.     Heart sounds: Normal heart sounds. No murmur heard.    No friction rub. No gallop.  Pulmonary:     Effort: Pulmonary effort is normal.     Breath sounds: Normal breath sounds. No wheezing, rhonchi or rales.  Abdominal:     General: Bowel sounds are normal. There is no distension.     Palpations: Abdomen is soft. There is no hepatomegaly, splenomegaly or mass.     Tenderness: There is no abdominal tenderness.  Musculoskeletal:        General: Normal range  of motion.     Cervical back: Normal range of motion and neck supple. No tenderness.     Thoracic back: No tenderness.     Right lower leg: No edema.     Left lower leg: No edema.  Lymphadenopathy:     Cervical: No cervical adenopathy.     Upper Body:     Right upper body: No supraclavicular or axillary adenopathy.     Left upper body: No supraclavicular or axillary adenopathy.     Lower Body: No right inguinal adenopathy. No left inguinal adenopathy.  Skin:    General: Skin is warm and dry.     Coloration: Skin is not jaundiced.     Findings: Rash (Multiple excoriations of the right shoulder and proximal arm) present.   Neurological:     Mental Status: He is alert and oriented to person, place, and time.     Cranial Nerves: No cranial nerve deficit.  Psychiatric:        Mood and Affect: Mood normal.        Behavior: Behavior normal.        Thought Content: Thought content normal.    LABS:      Latest Ref Rng & Units 01/21/2022   12:00 AM 11/25/2021   12:00 AM 09/26/2021   12:00 AM  CBC  WBC  8.6     7.4     5.7      Hemoglobin 13.5 - 17.5 13.5     13.2     13.0      Hematocrit 41 - 53 40     39     40      Platelets 150 - 400 K/uL 229     185     174         This result is from an external source.      Latest Ref Rng & Units 01/21/2022   12:00 AM 11/25/2021   12:00 AM 09/26/2021   12:00 AM  CMP  BUN 4 - $R'21 12     14     11      'wO$ Creatinine 0.6 - 1.3 0.9     0.9     1.0      Sodium 137 - 147 135     138     139      Potassium 3.5 - 5.1 mEq/L 3.0     3.6     3.9      Chloride 99 - 108 98     103     100      CO2 13 - $Re'22 29     26     'eay$ 32      Calcium 8.7 - 10.7 9.4     9.6     9.3      Alkaline Phos 25 - 125 67     65     67      AST 14 - 40 48     48     43      ALT 10 - 40 U/L $Remo'28     22     20         'vwyop$ This result is from an external source.    Latest Reference Range & Units Most Recent 11/25/21 11:57  M Protein SerPl Elph-Mcnc Not Observed g/dL 0.2 (H) (C) 01/21/22 11:49 0.3 (H) (C)  (H): Data is abnormally high (C): Corrected   Latest Reference  Range & Units 11/25/21 11:57 01/21/22 11:49  Kappa free light chain 3.3 - 19.4 mg/L 31.2 (H) 24.3 (H)  Lambda free light chains 5.7 - 26.3 mg/L 37.5 (H) 41.6 (H)  Kappa, lambda light chain ratio 0.26 - 1.65  0.83 0.58  (H): Data is abnormally high  PATHOLOGY: Diagnosis    A: Left mandibular bone, excision - Osteonecrosis and fracture site with acute inflammation and organisms consistent with actinomyces    ASSESSMENT & PLAN:  Assessment/Plan:  A 66 y.o. 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  His most recent myeloma  parameters show a mild increase in his lambda light chain.  However, his M-spike has fallen over the past few months..  Overall, there remain no significant changes in his myeloma parameters to where a change in therapy is necessary.  He will continue  ixazomib 4 mg once weekly, every 3 out of 4 weeks.  As he is still recuperating from his left jaw surgery, he knows to call us when his oral surgeon has given him the okay to restart treatment.  Otherwise, I will see him back in 8 weeks for repeat clinical assessment.  The patient understands all the plans discussed today and is in agreement with them.    Wang [place] Macarthur Critchley, MD

## 2022-01-28 ENCOUNTER — Inpatient Hospital Stay (INDEPENDENT_AMBULATORY_CARE_PROVIDER_SITE_OTHER): Payer: Medicare Other | Admitting: Oncology

## 2022-01-28 ENCOUNTER — Other Ambulatory Visit: Payer: Self-pay | Admitting: Oncology

## 2022-01-28 ENCOUNTER — Telehealth: Payer: Self-pay | Admitting: Oncology

## 2022-01-28 VITALS — BP 143/90 | HR 68 | Temp 98.0°F | Resp 18 | Ht 66.9 in | Wt 229.6 lb

## 2022-01-28 DIAGNOSIS — C9000 Multiple myeloma not having achieved remission: Secondary | ICD-10-CM | POA: Diagnosis not present

## 2022-01-28 LAB — MULTIPLE MYELOMA PANEL, SERUM
Albumin SerPl Elph-Mcnc: 4.1 g/dL (ref 2.9–4.4)
Albumin/Glob SerPl: 1.6 (ref 0.7–1.7)
Alpha 1: 0.2 g/dL (ref 0.0–0.4)
Alpha2 Glob SerPl Elph-Mcnc: 0.7 g/dL (ref 0.4–1.0)
B-Globulin SerPl Elph-Mcnc: 1 g/dL (ref 0.7–1.3)
Gamma Glob SerPl Elph-Mcnc: 0.6 g/dL (ref 0.4–1.8)
Globulin, Total: 2.6 g/dL (ref 2.2–3.9)
IgA: 244 mg/dL (ref 61–437)
IgG (Immunoglobin G), Serum: 870 mg/dL (ref 603–1613)
IgM (Immunoglobulin M), Srm: 20 mg/dL (ref 20–172)
M Protein SerPl Elph-Mcnc: 0.2 g/dL — ABNORMAL HIGH
Total Protein ELP: 6.7 g/dL (ref 6.0–8.5)

## 2022-01-28 MED ORDER — POTASSIUM CHLORIDE 20 MEQ/15ML (10%) PO SOLN: 20.0000 meq | Freq: Every day | ORAL | 1 refills | Status: DC

## 2022-01-28 NOTE — Telephone Encounter (Signed)
01/28/22 Next appt scheduled and confirmed with patient 

## 2022-01-31 ENCOUNTER — Other Ambulatory Visit: Payer: Self-pay | Admitting: Oncology

## 2022-01-31 DIAGNOSIS — C9000 Multiple myeloma not having achieved remission: Secondary | ICD-10-CM

## 2022-01-31 DIAGNOSIS — G8929 Other chronic pain: Secondary | ICD-10-CM

## 2022-01-31 DIAGNOSIS — D492 Neoplasm of unspecified behavior of bone, soft tissue, and skin: Secondary | ICD-10-CM

## 2022-01-31 MED ORDER — CYCLOBENZAPRINE HCL 10 MG PO TABS: 10.0000 mg | ORAL_TABLET | Freq: Three times a day (TID) | ORAL | 0 refills | Status: DC | PRN

## 2022-01-31 MED ORDER — HYDROCODONE-ACETAMINOPHEN 10-325 MG PO TABS: 1.0000 | ORAL_TABLET | ORAL | 0 refills | Status: DC | PRN

## 2022-03-18 ENCOUNTER — Inpatient Hospital Stay: Payer: Medicare Other | Attending: Oncology

## 2022-03-18 DIAGNOSIS — C9000 Multiple myeloma not having achieved remission: Secondary | ICD-10-CM | POA: Insufficient documentation

## 2022-03-18 LAB — COMPREHENSIVE METABOLIC PANEL
Albumin: 4.5 (ref 3.5–5.0)
Calcium: 10 (ref 8.7–10.7)

## 2022-03-18 LAB — HEPATIC FUNCTION PANEL
ALT: 23 U/L (ref 10–40)
AST: 49 — AB (ref 14–40)
Alkaline Phosphatase: 79 (ref 25–125)
Bilirubin, Total: 0.4

## 2022-03-18 LAB — BASIC METABOLIC PANEL WITH GFR
BUN: 13 (ref 4–21)
CO2: 27 — AB (ref 13–22)
Chloride: 102 (ref 99–108)
Creatinine: 1 (ref 0.6–1.3)
Glucose: 129
Potassium: 3.6 meq/L (ref 3.5–5.1)
Sodium: 133 — AB (ref 137–147)

## 2022-03-18 LAB — CBC AND DIFFERENTIAL
HCT: 42 (ref 41–53)
Hemoglobin: 14.4 (ref 13.5–17.5)
Neutrophils Absolute: 4.84
Platelets: 226 K/uL (ref 150–400)
WBC: 8.2

## 2022-03-18 LAB — CBC: RBC: 4.57 (ref 3.87–5.11)

## 2022-03-20 LAB — KAPPA/LAMBDA LIGHT CHAINS
Kappa free light chain: 30.7 mg/L — ABNORMAL HIGH (ref 3.3–19.4)
Kappa, lambda light chain ratio: 0.67 (ref 0.26–1.65)
Lambda free light chains: 45.6 mg/L — ABNORMAL HIGH (ref 5.7–26.3)

## 2022-03-21 ENCOUNTER — Ambulatory Visit: Payer: Self-pay | Admitting: Licensed Clinical Social Worker

## 2022-03-21 NOTE — Patient Outreach (Signed)
  Care Coordination   03/21/2022 Name: Andrew Espinoza MRN: [id number] DOB: January 29, 1956   Care Coordination Outreach Attempts:  An unsuccessful telephone outreach was attempted today to offer the patient information about available care coordination services as a benefit of their health plan.   Follow Up Plan:  Additional outreach attempts will be made to offer the patient care coordination information and services.   Encounter Outcome:  No Answer  Care Coordination Interventions Activated:  No   Care Coordination Interventions:  No, not indicated    Milus Height, [place]  Social Worker [facility] Care Management  [phone]

## 2022-03-24 LAB — MULTIPLE MYELOMA PANEL, SERUM
Albumin SerPl Elph-Mcnc: 3.8 g/dL (ref 2.9–4.4)
Albumin/Glob SerPl: 1.4 (ref 0.7–1.7)
Alpha 1: 0.3 g/dL (ref 0.0–0.4)
Alpha2 Glob SerPl Elph-Mcnc: 0.6 g/dL (ref 0.4–1.0)
B-Globulin SerPl Elph-Mcnc: 1 g/dL (ref 0.7–1.3)
Gamma Glob SerPl Elph-Mcnc: 0.9 g/dL (ref 0.4–1.8)
Globulin, Total: 2.9 g/dL (ref 2.2–3.9)
IgA: 266 mg/dL (ref 61–437)
IgG (Immunoglobin G), Serum: 1026 mg/dL (ref 603–1613)
IgM (Immunoglobulin M), Srm: 22 mg/dL (ref 20–172)
M Protein SerPl Elph-Mcnc: 0.3 g/dL — ABNORMAL HIGH
Total Protein ELP: 6.7 g/dL (ref 6.0–8.5)

## 2022-03-25 ENCOUNTER — Ambulatory Visit: Payer: Medicare Other | Admitting: Oncology

## 2022-03-27 ENCOUNTER — Telehealth: Payer: Self-pay

## 2022-03-27 NOTE — Patient Outreach (Signed)
  Care Coordination   03/27/2022 Name: Andrew Espinoza MRN: [id number] DOB: [birth year]   Care Coordination Outreach Attempts:  A second unsuccessful outreach was attempted today to offer the patient with information about available care coordination services as a benefit of their health plan.     Follow Up Plan:  Additional outreach attempts will be made to offer the patient care coordination information and services.   Encounter Outcome:  No Answer  Care Coordination Interventions Activated:  No   Care Coordination Interventions:  No, not indicated    Tomasa Rand, RN, BSN, CEN [facility] [employer] [phone]

## 2022-03-28 ENCOUNTER — Telehealth: Payer: Self-pay

## 2022-03-28 NOTE — Patient Outreach (Signed)
  Care Coordination   Initial Visit Note   03/28/2022 Name: [name] MRN: [id number] DOB: 10/14/1955  [name] is a 66 y.o. year old male who sees [place], Angelique Blonder, MD for primary care. I spoke with  Claudette Stapler by phone today.  What matters to the patients health and wellness today?  Placed call to patient and reviewed reason for call. Patient reports he just had surgery and would like a call back on Monday.    Goals Addressed   None     SDOH assessments and interventions completed:  No     Care Coordination Interventions Activated:  No  Care Coordination Interventions:  No, not indicated   Follow up plan: Follow up call scheduled for 03/31/2022    Encounter Outcome:  Pt. Scheduled     Tomasa Rand, RN, BSN, [place] Coordinator [phone]

## 2022-03-31 ENCOUNTER — Ambulatory Visit: Payer: Self-pay

## 2022-03-31 NOTE — Patient Outreach (Signed)
  Care Coordination   Initial Visit Note   03/31/2022 Name: Adilson Grafton MRN: [id number] DOB: 1956-05-06  Andrew Espinoza is a 66 y.o. year old male who sees [place], Angelique Blonder, MD for primary care. I spoke with  Claudette Stapler by phone today.  What matters to the patients health and wellness today?  Placed call to patient today to review and offer [facility] care coordination program.   Patient reports that he is doing well. Reports that his refill was denied. Patient reports that it has been more than a year since he was seen at the office. Patient reports he does not want to go to the office for a visit.  Encouraged patient to call and leave a message for the nurse.  Patient declined.  Patient has declined offers for [facility] care coordination.    SDOH assessments and interventions completed:  No     Care Coordination Interventions Activated:  No  Care Coordination Interventions:  No, not indicated   Follow up plan: No further intervention required.   Encounter Outcome:  Pt. Refused   Tomasa Rand, RN, BSN, CEN [facility] [employer] [phone]

## 2022-04-28 ENCOUNTER — Telehealth: Payer: Self-pay

## 2022-04-28 NOTE — Telephone Encounter (Signed)
I notified Andrew Espinoza that [name] has been on hold since November due to left mandible surgery 03/25/2022 (osteonecrosis @ [facility]). Pt's first post op visit was 04/24/2022. I notified Andrew Espinoza that once Dr Bobby Rumpf sees pt in office again, he will decided when to restart the [facility] therapy. She verbalized understanding.

## 2022-05-20 ENCOUNTER — Other Ambulatory Visit: Payer: Self-pay | Admitting: Oncology

## 2022-05-20 DIAGNOSIS — D492 Neoplasm of unspecified behavior of bone, soft tissue, and skin: Secondary | ICD-10-CM

## 2022-05-20 DIAGNOSIS — C9000 Multiple myeloma not having achieved remission: Secondary | ICD-10-CM

## 2022-05-21 ENCOUNTER — Other Ambulatory Visit: Payer: Self-pay | Admitting: Hematology and Oncology

## 2022-05-21 DIAGNOSIS — D492 Neoplasm of unspecified behavior of bone, soft tissue, and skin: Secondary | ICD-10-CM

## 2022-05-21 DIAGNOSIS — C9000 Multiple myeloma not having achieved remission: Secondary | ICD-10-CM

## 2022-09-05 NOTE — Telephone Encounter (Signed)
DOne

## 2022-10-16 ENCOUNTER — Other Ambulatory Visit (HOSPITAL_COMMUNITY): Payer: Self-pay

## 2022-10-16 ENCOUNTER — Telehealth: Payer: Self-pay | Admitting: Pharmacy Technician

## 2022-10-16 NOTE — Telephone Encounter (Signed)
Oral Oncology Patient Advocate Encounter  Was successful in securing patient a $12,000 grant from [employer] to provide copayment coverage for [employer].  This will keep the out of pocket expense at $0.     [employer] ID: [id number]   The billing information is as follows and has been shared with [facility].    RxBin: [id number] PCN: [id number] Member ID: [id number] Group ID: [id number] Dates of Eligibility: 4/30/4 through 09/15/23  Fund:  MM  Jinger Neighbors, CPhT-Adv Oncology Pharmacy Patient Advocate [facility] Direct Number: [phone]  Fax: [phone]

## 2023-01-01 ENCOUNTER — Other Ambulatory Visit: Payer: Self-pay | Admitting: Hematology and Oncology

## 2023-01-07 ENCOUNTER — Telehealth: Payer: Self-pay

## 2023-01-07 NOTE — Telephone Encounter (Signed)
I had asked scheduling to call pt and make appt, as he hasn't been seen here since 2023. Pt refused to make appt and scheduler sent call to me. Pt aggravated and states, "Why do I need to come up there? I'm not taking any chemotherapy because it was about to kill me. Dr Andrey Cota give me anything for pain, because I wont take chemo".

## 2023-10-08 ENCOUNTER — Other Ambulatory Visit: Payer: Self-pay | Admitting: Oncology

## 2023-10-08 DIAGNOSIS — C9000 Multiple myeloma not having achieved remission: Secondary | ICD-10-CM

## 2023-10-08 NOTE — Progress Notes (Signed)
 [facility]  [address] [place],  [place]  [address] [phone]  Clinic Day:  10/09/2023  Referring physician: Madelyne Schiff, MD  HISTORY OF PRESENT ILLNESS:  The patient is a 68 y.o. 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 He has not been seen by me since September 2023.  At that time, he was taking Ninlaro  weekly, every 3 out of 4 weeks.  However, he stopped taking this medicine around that same time as it apparently caused him significant toxicity.  He comes in today to re-establish follow-up, primarily because he recently developed a pathologic fracture of his distal left femur.  This was surgically repaired at [facility] in [place] in late April 2025.  The patient currently denies having any significant pain at the area of his fracture.  Although he is willing to reconsider some form of therapy for his multiple myeloma, the patient is adamant about it not negatively impacting his daily quality of life.  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  A bone marrow done in June 2022 showed 14-15% plasma cells.  Cytogenetics were normal, but the myeloma FISH panel could not be done due to insufficient quantity.  The patient received 2 cycles of RVD therapy before stopping therapy for numerous months.  He restarted Revlimid  at 10 mg daily in January 2023 due to labs showing disease recurrence.  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  Due to jaw issues and other problems the patient claims was from from Revlimid , he was switched to Ixazomib in March 2023.  However, due to poor tolerance, he stopped this medicine on his own in September 2023.  PHYSICAL EXAM:  Blood pressure 123/85, pulse 77, temperature 99.4 F (37.4 C), temperature source Oral, resp. rate 14, weight 180  lb (81.6 kg), SpO2 100%. Wt Readings from Last 3 Encounters:  10/09/23 180 lb (81.6 kg)  01/28/22 229 lb 9.6 oz (104.1 kg)  12/02/21 246 lb 12.8 oz (111.9 kg)   Body mass index is 28.28 kg/m.  Performance status (ECOG): 1 - Symptomatic but completely ambulatory  Physical Exam Vitals and nursing note reviewed.  Constitutional:      General: He is not in acute distress.    Appearance: Normal appearance. He is normal weight.     Comments: He appears weaker, thinner, and more frail versus his last visit.  He is in a wheelchair.  HENT:     Head: Normocephalic and atraumatic.     Mouth/Throat:     Mouth: Mucous membranes are moist.     Pharynx: Oropharynx is clear. No oropharyngeal exudate or posterior oropharyngeal erythema.  Eyes:     General: No scleral icterus.    Extraocular Movements: Extraocular movements intact.     Conjunctiva/sclera: Conjunctivae normal.     Pupils: Pupils are equal, round, and reactive to light.  Cardiovascular:     Rate and Rhythm: Normal rate and regular rhythm.     Heart sounds: Normal heart sounds. No murmur heard.    No friction rub. No gallop.  Pulmonary:     Effort: Pulmonary effort is normal.     Breath sounds: Normal breath sounds. No wheezing, rhonchi or rales.  Abdominal:     General: Bowel sounds are normal. There is no distension.  Palpations: Abdomen is soft. There is no hepatomegaly, splenomegaly or mass.     Tenderness: There is no abdominal tenderness.  Musculoskeletal:        General: Normal range of motion.     Cervical back: Normal range of motion and neck supple. No tenderness.     Thoracic back: No tenderness.     Right lower leg: No edema.     Left lower leg: No edema.  Lymphadenopathy:     Cervical: No cervical adenopathy.     Upper Body:     Right upper body: No supraclavicular or axillary adenopathy.     Left upper body: No supraclavicular or axillary adenopathy.     Lower Body: No right inguinal adenopathy. No left  inguinal adenopathy.  Skin:    General: Skin is warm and dry.     Coloration: Skin is not jaundiced.     Findings: No rash.  Neurological:     Mental Status: He is alert and oriented to person, place, and time.     Cranial Nerves: No cranial nerve deficit.  Psychiatric:        Mood and Affect: Mood normal.        Behavior: Behavior normal.        Thought Content: Thought content normal.    LABS:      Latest Ref Rng & Units 10/09/2023   11:04 AM 03/18/2022   12:00 AM 01/21/2022   12:00 AM  CBC  WBC 4.0 - 10.5 K/uL 8.1  8.2     8.6      Hemoglobin 13.0 - 17.0 g/dL 91.4  78.2     95.6      Hematocrit 39.0 - 52.0 % 37.9  42     40      Platelets 150 - 400 K/uL 190  226     229         This result is from an external source.      Latest Ref Rng & Units 10/09/2023   11:04 AM 03/18/2022   12:00 AM 01/21/2022   12:00 AM  CMP  Glucose 70 - 99 mg/dL 213     BUN 8 - 23 mg/dL 15  13     12       Creatinine 0.61 - 1.24 mg/dL 0.86  1.0     0.9      Sodium 135 - 145 mmol/L 137  133     135      Potassium 3.5 - 5.1 mmol/L 4.0  3.6     3.0      Chloride 98 - 111 mmol/L 100  102     98      CO2 22 - 32 mmol/L 25  27     29       Calcium  8.9 - 10.3 mg/dL 57.8  46.9     9.4      Total Protein 6.5 - 8.1 g/dL 7.1     Total Bilirubin 0.0 - 1.2 mg/dL 0.3     Alkaline Phos 38 - 126 U/L 109  79     67      AST 15 - 41 U/L 47  49     48      ALT 0 - 44 U/L 13  23     28          This result is from an external source.    ASSESSMENT & PLAN:  Assessment/Plan:  A 68 y.o. male  with ISS stage I lambda light chain multiple myeloma.  As mentioned previously, this patient has not been seen by me since September 2023.  In clinic today, all of his myeloma parameters will be reassessed to ascertain where his disease is at this point in time.  Furthermore, as he is complaining of other areas of bone pain, including his shoulders, I will have him undergo a PET scan to delineate all other possible areas of  disease his multiple myeloma may be.  With respect to his disease management, I will place this gentleman on daratumumab/Revlimid /Decadron  (dRD).  Daratumumab will be given weekly subcutaneously for the first 8 weeks, then will be spaced out accordingly.  Revlimid  will be given at 10 mg daily, every 21 out of 28 days.  Decadron  will be given at 20 mg weekly.  I anticipate starting this regimen on June 9th, which is the day that I will see him back to go over all of his PET scan images and their implications. The patient understands all the plans discussed today and is in agreement with them.    Keigan Girten Felicia Horde, MD

## 2023-10-09 ENCOUNTER — Other Ambulatory Visit: Payer: Self-pay | Admitting: Oncology

## 2023-10-09 ENCOUNTER — Other Ambulatory Visit: Payer: Self-pay

## 2023-10-09 ENCOUNTER — Telehealth: Payer: Self-pay

## 2023-10-09 ENCOUNTER — Encounter: Payer: Self-pay | Admitting: Oncology

## 2023-10-09 ENCOUNTER — Inpatient Hospital Stay

## 2023-10-09 ENCOUNTER — Inpatient Hospital Stay: Attending: Oncology | Admitting: Oncology

## 2023-10-09 ENCOUNTER — Telehealth: Payer: Self-pay | Admitting: Oncology

## 2023-10-09 VITALS — BP 123/85 | HR 77 | Temp 99.4°F | Resp 14 | Wt 180.0 lb

## 2023-10-09 DIAGNOSIS — C9000 Multiple myeloma not having achieved remission: Secondary | ICD-10-CM

## 2023-10-09 LAB — CMP (CANCER CENTER ONLY)
ALT: 13 U/L (ref 0–44)
AST: 47 U/L — ABNORMAL HIGH (ref 15–41)
Albumin: 4.1 g/dL (ref 3.5–5.0)
Alkaline Phosphatase: 109 U/L (ref 38–126)
Anion gap: 13 (ref 5–15)
BUN: 15 mg/dL (ref 8–23)
CO2: 25 mmol/L (ref 22–32)
Calcium: 10.3 mg/dL (ref 8.9–10.3)
Chloride: 100 mmol/L (ref 98–111)
Creatinine: 0.92 mg/dL (ref 0.61–1.24)
GFR, Estimated: >60 mL/min (ref >60)
Glucose, Bld: 100 mg/dL — ABNORMAL HIGH (ref 70–99)
Potassium: 4 mmol/L (ref 3.5–5.1)
Sodium: 137 mmol/L (ref 135–145)
Total Bilirubin: 0.3 mg/dL (ref 0.0–1.2)
Total Protein: 7.1 g/dL (ref 6.5–8.1)

## 2023-10-09 LAB — CBC WITH DIFFERENTIAL (CANCER CENTER ONLY)
Abs Immature Granulocytes: 0.03 10*3/uL (ref 0.00–0.07)
Basophils Absolute: 0.1 10*3/uL (ref 0.0–0.1)
Basophils Relative: 1 %
Eosinophils Absolute: 0.3 10*3/uL (ref 0.0–0.5)
Eosinophils Relative: 3 %
HCT: 37.9 % — ABNORMAL LOW (ref 39.0–52.0)
Hemoglobin: 12.1 g/dL — ABNORMAL LOW (ref 13.0–17.0)
Immature Granulocytes: 0 %
Immature Platelet Fraction: 1.6 % (ref 1.2–8.6)
Lymphocytes Relative: 30 %
Lymphs Abs: 2.4 10*3/uL (ref 0.7–4.0)
MCH: 30.1 pg (ref 26.0–34.0)
MCHC: 31.9 g/dL (ref 30.0–36.0)
MCV: 94.3 fL (ref 80.0–100.0)
Monocytes Absolute: 0.5 10*3/uL (ref 0.1–1.0)
Monocytes Relative: 7 %
Neutro Abs: 4.8 10*3/uL (ref 1.7–7.7)
Neutrophils Relative %: 59 %
Platelet Count: 190 10*3/uL (ref 150–400)
RBC: 4.02 MIL/uL — ABNORMAL LOW (ref 4.22–5.81)
RDW: 17.3 % — ABNORMAL HIGH (ref 11.5–15.5)
WBC Count: 8.1 10*3/uL (ref 4.0–10.5)
nRBC: 0 % (ref 0.0–0.2)

## 2023-10-09 MED ORDER — LENALIDOMIDE 10 MG PO CAPS
10.0000 mg | ORAL_CAPSULE | Freq: Every day | ORAL | 0 refills | Status: DC
  Filled 2023-10-09: qty 21, 21d supply, fill #0

## 2023-10-09 NOTE — Telephone Encounter (Signed)
 Patient has been scheduled for follow-up visit per 10/07/23 LOS.  Pt aware of scheduled appt details.

## 2023-10-09 NOTE — Telephone Encounter (Signed)
 Brea left message from [facility] to clarify orders from todays visit , call transferred to [facility] CMA for Dr Harles Lied.

## 2023-10-10 ENCOUNTER — Other Ambulatory Visit (HOSPITAL_COMMUNITY): Payer: Self-pay

## 2023-10-12 LAB — MULTIPLE MYELOMA PANEL, SERUM
Albumin SerPl Elph-Mcnc: 4 g/dL (ref 2.9–4.4)
Albumin/Glob SerPl: 1.4 (ref 0.7–1.7)
Alpha 1: 0.4 g/dL (ref 0.0–0.4)
Alpha2 Glob SerPl Elph-Mcnc: 0.7 g/dL (ref 0.4–1.0)
B-Globulin SerPl Elph-Mcnc: 1 g/dL (ref 0.7–1.3)
Gamma Glob SerPl Elph-Mcnc: 0.8 g/dL (ref 0.4–1.8)
Globulin, Total: 2.9 g/dL (ref 2.2–3.9)
IgA: 233 mg/dL (ref 61–437)
IgG (Immunoglobin G), Serum: 927 mg/dL (ref 603–1613)
IgM (Immunoglobulin M), Srm: 37 mg/dL (ref 20–172)
M Protein SerPl Elph-Mcnc: 0.5 g/dL — ABNORMAL HIGH
Total Protein ELP: 6.9 g/dL (ref 6.0–8.5)

## 2023-10-13 ENCOUNTER — Telehealth: Payer: Self-pay | Admitting: Pharmacy Technician

## 2023-10-13 ENCOUNTER — Other Ambulatory Visit: Payer: Self-pay

## 2023-10-13 ENCOUNTER — Telehealth: Payer: Self-pay

## 2023-10-13 ENCOUNTER — Other Ambulatory Visit (HOSPITAL_COMMUNITY): Payer: Self-pay

## 2023-10-13 DIAGNOSIS — C9000 Multiple myeloma not having achieved remission: Secondary | ICD-10-CM

## 2023-10-13 LAB — KAPPA/LAMBDA LIGHT CHAINS
Kappa free light chain: 20.8 mg/L — ABNORMAL HIGH (ref 3.3–19.4)
Kappa, lambda light chain ratio: 0.08 — ABNORMAL LOW (ref 0.26–1.65)
Lambda free light chains: 274.7 mg/L — ABNORMAL HIGH (ref 5.7–26.3)

## 2023-10-13 NOTE — Telephone Encounter (Signed)
 Oral Oncology Pharmacist Encounter  Received new prescription for Revlimid  (lenalidomide ) for the treatment of multiple myeloma in conjunction with daratumumab and dexamethasone , planned duration until disease progression or unacceptable toxicity.  Labs from 10/09/23 (CBC and CMP) assessed, no interventions needed. Prescription dose and frequency assessed for appropriateness. Prescription will need to be modified to include cycle length of 28 days with patient taking medication for 21 days and 7 days off.   Current medication list in Epic reviewed, no significant/ relevant DDIs with Revlimid  identified.  Evaluated chart and no patient barriers to medication adherence noted.   Patient agreement for treatment documented in MD note on 10/09/2023.  Prescription has been e-scribed to the [facility] for benefits analysis and approval.  Oral Oncology Clinic will continue to follow for insurance authorization, copayment issues, initial counseling and start date.  Tanya Marvin, PharmD Hematology/Oncology Clinical Pharmacist [facility] [phone] 10/13/2023 8:44 AM

## 2023-10-13 NOTE — Telephone Encounter (Signed)
 Oral Oncology Patient Advocate Encounter  Prior Authorization for lenalidomide  has been approved.    PA# PA Case ID #: [id number] Effective dates: 09/13/23 through 10/12/24  Patients co-pay is $[id number].  Pt likely has to fill through [facility] specialty pharmacy due to having [facility], even though the test claim doesn't tell me that.   Roda Cirri, CPhT Specialty Pharmacy Patient Advocate Phone: [phone] Fax: [phone]

## 2023-10-13 NOTE — Telephone Encounter (Deleted)
 Oral Oncology Patient Advocate Encounter   PA must be completed via phone call with [employer] [phone]    Roda Cirri, CPhT Specialty Pharmacy Patient Advocate Phone: [phone] Fax: [phone]

## 2023-10-13 NOTE — Telephone Encounter (Signed)
 Oral Oncology Patient Advocate Encounter   Received notification that prior authorization for lenalidomide  is required.   PA submitted on 10/13/23 Key [id number] Status is pending     Roda Cirri, CPhT Specialty Pharmacy Patient Advocate Phone: [phone] Fax: [phone]

## 2023-10-13 NOTE — Telephone Encounter (Signed)
 Oral Oncology Patient Advocate Encounter  Was successful in securing patient a $8000 grant from [facility] to provide copayment coverage for lenalidomide .  This will keep the out of pocket expense at $0.     [employer] ID: [id number]    The billing information is as follows and will need to be shared with the pharmacy he fills at.    RxBin: [id number] PCN: [id number] Member ID: [id number] Group ID: [id number] Dates of Eligibility: 09/16/23 through 09/14/2024  Fund:  Multiple Myeloma - Medicare Access  Roda Cirri, CPhT Specialty Pharmacy Patient Advocate Phone: [phone] Fax: [phone]

## 2023-10-14 ENCOUNTER — Telehealth: Payer: Self-pay

## 2023-10-14 MED ORDER — LENALIDOMIDE 10 MG PO CAPS: 10.0000 mg | ORAL_CAPSULE | Freq: Every day | ORAL | 0 refills | Status: DC

## 2023-10-14 NOTE — Telephone Encounter (Signed)
 Oncology Pharmacist Encounter  Prescription modified to include cycle length and duration of medication to administer per discussion with MD. Prescription sent to [facility] and notified [facility] of [employer] grant if patient is able to fill at [facility]. Patient received previously from them. We will continue to follow up.  Yavonne Kiss, PharmD Hematology/Oncology Clinical Pharmacist [facility] [phone]

## 2023-10-14 NOTE — Telephone Encounter (Signed)
 Andrew Espinoza, Facilities manager for [employer] @ [facility] LVM on 10/13/2023, stating that pt is there for rehab under Medicare Stay. They are unfortunately able to pay for the Revlimid . However, pt is scheduled to be discharged before 10/26/2023 start date.

## 2023-10-19 ENCOUNTER — Other Ambulatory Visit: Payer: Self-pay

## 2023-10-22 ENCOUNTER — Encounter: Payer: Self-pay | Admitting: Oncology

## 2023-10-23 ENCOUNTER — Encounter: Payer: Self-pay | Admitting: Oncology

## 2023-10-23 ENCOUNTER — Other Ambulatory Visit: Payer: Self-pay

## 2023-10-23 NOTE — Progress Notes (Cosign Needed)
 [facility] CARE CLINIC CONSULT NOTE [facility] [address] [place], [place] [address] [phone]   Fax:([phone]    PATIENT CARE TEAM: Patient Care Team: Madelyne Schiff, MD as PCP - General (Family Medicine) Audie Bleacher, MD as Consulting Physician (Neurosurgery) Deloria Fetch, MD as Consulting Physician (Oncology) Verlie Glisson, MD as Consulting Physician (Radiation Oncology)   Name of the patient: Andrew Espinoza  [id number]  1956/05/03   Date of visit: 10/23/23  Diagnosis: Multiple myeloma  Chief complaint/Reason for visit- Initial Meeting for [facility] preparing for starting chemotherapy  Heme/Onc history:  Oncology History  Multiple myeloma (HCC)  10/24/2020 Cancer Staging   Staging form: Plasma Cell Myeloma and Plasma Cell Disorders, AJCC 8th Edition - Clinical stage from 10/24/2020: RISS Stage I (Beta-2 -microglobulin (mg/L): 2.5, Albumin  (g/dL): 4.1, ISS: Stage I, High-risk cytogenetics: Absent, LDH: Normal) - Signed by Deloria Fetch, MD on 12/31/2020 Histopathologic type: Multiple myeloma Stage prefix: Initial diagnosis Beta 2 microglobulin range (mg/L): Less than 3.5 Albumin  range (g/dL): Greater than or equal to 3.5 Cytogenetics: No abnormalities   11/07/2020 Initial Diagnosis   Multiple myeloma (HCC)   11/20/2020 - 01/01/2021 Chemotherapy   Patient is on Treatment Plan : MYELOMA  RVD SQ q21d x 4 cycles     10/26/2023 -  Chemotherapy   Patient is on Treatment Plan : MYELOMA NEWLY DIAGNOSED NON-TRANSPLANT CANDIATE / RELAPSED REFRACTORY Daratumumab SQ + Lenalidomide  + Dexamethasone  Weekly (DaraRd) q28d       Interval history: The patient presents to chemo care clinic today for initial meeting in preparation for starting chemotherapy. I introduced the chemo care clinic and we discussed that the role of the clinic is to assist those who are at an increased risk of emergency room visits and/or complications during the  course of chemotherapy treatment. We discussed that the increased risk takes into account factors such as age, performance status, and co-morbidities. We also discussed that for some, this might include barriers to care such as not having a primary care provider, lack of insurance/transportation, or not being able to afford medications. We discussed that the goal of the program is to help prevent unplanned ER visits and help reduce complications during chemotherapy. We do this by discussing specific risk factors to each individual and identifying ways that we can help improve these risk factors and reduce barriers to care.   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    Past Surgical History:  Procedure Laterality Date   ANKLE SURGERY  05/19/1996   broken fibula, remaining bone screws   FEMUR FRACTURE SURGERY Left    KIDNEY STONE SURGERY  05/19/1994   lumbiical hernia  05/21/2012   POSTERIOR LUMBAR FUSION 4 LEVEL N/A 09/26/2020   Procedure: Thoracic eleven Corpectomy  with Thoracic nine to Lumbar one arthrodesis and pedicle screws;  Surgeon: Audie Bleacher, MD;  Location: [facility] OR;  Service: Neurosurgery;  Laterality: N/A;    Social History   Socioeconomic History   Marital status: Widowed    Spouse name: Not on file   Number of children: Not on file   Years of education: Not on file   Highest education level: Not on file  Occupational History   Occupation: Truck driver     Comment: [employer]  Tobacco Use   Smoking status: Every Day   Smokeless tobacco: Never  Vaping Use   Vaping status: Never Used  Substance and Sexual Activity   Alcohol use: No   Drug use: No   Sexual activity: Not on file  Other Topics Concern   Not on file  Social History Narrative   shiftworker with  severe OSA, 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    retitration on 03-17-11 PSG sucessful. . 100% compliance - high LEAK-    OSA controlled with CPAP use, AHI 5.0 due to high air leaks on nasal mask - now Epworth 6  from  pre titration at  Epworth of 15 .    mask fitting with tech today revealed pilaire to fit him best - will ask [facility] to replace this type mask. when due.  downloads from 2013 and in office today compared , educational material given.    7 hours 44 minute ,    daily exerciser,  improved BMI .               Social Drivers of Corporate investment banker Strain: Low Risk  (09/09/2023)   Received from [facility] the [employer] Strain (CARDIA)    Difficulty of Paying Living Expenses: Not hard at all  Food Insecurity: No Food Insecurity (09/09/2023)   Received from [facility] the [employer] Vital Sign    Worried About Running Out of Food in the Last Year: Never true    Ran Out of Food in the Last Year: Never true  Transportation Needs: No Transportation Needs (09/09/2023)   Received from [place] [facility] the [employer] (Medical): No    Lack of Transportation (Non-Medical): No  Physical Activity: Not on file  Stress: Not on file  Social Connections: Not on file  Intimate Partner Violence: Not At Risk (09/09/2023)   Received from [facility] the [employer], Afraid, Rape, and Kick questionnaire    Fear of Current or Ex-Partner: No    Emotionally Abused: No    Physically Abused: No    Sexually Abused: No    Family History  Problem Relation Age of  Onset   Diabetes Mother    Diabetes Brother      Current Outpatient Medications:    enoxaparin (LOVENOX) 40 MG/0.4ML injection, Inject 40 mg into the skin daily., Disp: , Rfl:    esomeprazole (NEXIUM) 40 MG capsule, Take 40 mg by mouth daily at 12 noon., Disp: , Rfl:    gabapentin (NEURONTIN) 100 MG capsule, Take 100 mg by mouth 3 (three) times daily., Disp: , Rfl:    HYDROcodone -acetaminophen  (NORCO/VICODIN) 5-325 MG tablet, Take 2 tablets by mouth every 4 (four) hours as needed for moderate pain (pain score 4-6)., Disp: , Rfl:    hydrOXYzine (ATARAX) 25 MG tablet, Take 25 mg by mouth every 6 (six) hours as needed for  itching., Disp: , Rfl:    lenalidomide  (REVLIMID ) 10 MG capsule, Take 1 capsule (10 mg total) by mouth daily. Take for 21 days on, 7 days off. Repeat every 28 days., Disp: 21 capsule, Rfl: 0   Loperamide  HCl 1 MG/7.5ML LIQD, Take 2 mg by mouth daily as needed (diarrhea or loose stools)., Disp: , Rfl:    melatonin 3 MG TABS tablet, Take 6 mg by mouth at bedtime., Disp: , Rfl:    potassium chloride  SA (KLOR-CON  M) 20 MEQ tablet, Take 1 tablet (20 mEq total) by mouth 2 (two) times daily. as directed, Disp: 30 tablet, Rfl: 0     Latest Ref Rng & Units 10/09/2023   11:04 AM  CMP  Glucose 70 - 99 mg/dL 213   BUN 8 - 23 mg/dL 15   Creatinine 0.86 - 1.24 mg/dL 5.78   Sodium 469 - 629 mmol/L 137   Potassium 3.5 - 5.1 mmol/L 4.0   Chloride 98 - 111 mmol/L 100   CO2 22 - 32 mmol/L 25   Calcium  8.9 - 10.3 mg/dL 52.8   Total Protein 6.5 - 8.1 g/dL 7.1   Total Bilirubin 0.0 - 1.2 mg/dL 0.3   Alkaline Phos 38 - 126 U/L 109   AST 15 - 41 U/L 47   ALT 0 - 44 U/L 13       Latest Ref Rng & Units 10/09/2023   11:04 AM  CBC  WBC 4.0 - 10.5 K/uL 8.1   Hemoglobin 13.0 - 17.0 g/dL 41.3   Hematocrit 24.4 - 52.0 % 37.9   Platelets 150 - 400 K/uL 190     No images are attached to the encounter.  No results found.   Assessment and plan:  The patient is a 68 y.o. male who presents  to Chemo Care Clinic for initial meeting in preparation for starting chemotherapy for the treatment of  1. Multiple myeloma not having achieved remission (HCC)   .   Chemo Care Clinic/High Risk for ER/Hospitalization during chemotherapy- We discussed the role of the chemo care clinic and identified patient specific risk factors. 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  Date of education: 10/26/2023 2.  Physician name: Dr. Ciro Cress 3.  Diagnosis: Multiple myeloma 4.  Stage: I 5.  Control 6.  Chemotherapy plan including drugs and how  often: Daratumumab weekly for 8 weeks, then every 2 weeks/lenalidomide  daily for 3 weeks on and 1 week off/dexamethasone  weekly 7.  Start date: 10/26/2023 8.  Other referrals: None at this time 9.  The patient is to call our office with any questions or concerns.  Our office number [phone], if after hours or on the weekend, call the same number  and wait for the answering service.  There is always an oncologist on call. 10.  Medications prescribed: ondansetron , loperamide , acyclovir , dexamethasone  11.  The patient has verbalized understanding of the treatment plan and has no barriers to adherence or understanding.   Obtained signed consent from patient.   Discussed symptoms including:  1.  Low blood counts including white blood cells red blood cells, and platelets.  If experience increased fatigue or abnormal bruising or bleeding, call our office. 2.  Infection including to avoid large crowds, wash hands frequently, and stay away from people who were sick.  If fever develops of 100.4 or higher, call our office. 3.  Mucositis:  Instructions on mouth rinse given (baking soda and salt mixture).  Keep mouth clean.  Use soft bristle toothbrush.  Avoid alcohol containing mouthwash.  If mouth sores develop, call our office. 4.  Nausea/vomiting:  Prescriptions given: ondansetron  8 mg every 8 hours as needed for nausea or vomiting and prochlorperazine  10 mg every 6 hours as needed for nausea or vomiting, may alternate these medications and take around the clock if persistent.  If nausea and vomiting is not controlled, call our office 5.  Diarrhea: Use over-the-counter Imodium .  Call our office if diarrhea is not controlled. 6.  Constipation: Use senna-S, 1 to 2 tablets twice a day.  Call our office if no BM in 2 to 3 days. 7.  Loss of appetite:  Try to eat small meals every 2-3 hours.  Call our office if not able to eat or drink. 8.  Taste changes:  Try zinc 50 mg daily.  If becomes severe call clinic. 9.  Drink 2 to 3 quarts of water per day. Call our office if not able to drink enough for urine to be pale yellow. 10. Avoid alcoholic beverages. 11. Peripheral neuropathy: Call office if numbness or tingling in hands or feet worsens or is suddenly severe. 12.  Ringing in the ears or hearing loss.  Call our office if this develops.    The patient  was given written information printed from [employer] patient education on individual chemotherapy agents which includes: Name of medications Approved uses Dose and schedule Storage and handling Handling body fluids and waste Drug and food interactions Possible side effects and management Pregnancy, sexual activity, and contraception Obtaining medication   Gave information on the supportive care team and how to contact them regarding services.  Discussed advanced directives.  The patient does not have their advanced directives.   We discussed that social determinants of health may have significant impacts on health and outcomes for cancer patients.  Today we discussed specific social determinants of performance status, alcohol use, depression, financial needs, food insecurity, housing, interpersonal violence, social connections, stress, tobacco use, and transportation.    After lengthy discussion the following were identified as areas of need:   Outpatient services: We discussed options including home based and outpatient services, DME, nutrition counseling, and supportive care program. We discussed that patients who participate in regular physical activity report fewer negative impacts of cancer and treatments and report less fatigue.   Financial Concerns: We discussed that  living with cancer can create tremendous financial burden.  We discussed options for assistance. I asked that if assistance is needed in affording medications or paying bills to please let us  know so that we can provide assistance. We discussed options for food including social services.  Referral to Social work:  Introduced services available, such as support with utility bill, cell phone and gas vouchers.  Introduced [place], [place] who can provide individual counseling.    Support groups: We discussed options for support groups at [facility]. We discussed options for managing stress including healthy eating and  exercise, as well as participating in no charge counseling services at the cancer center and support groups.  If these are of interest, patient can notify either myself or primary nursing team.We discussed options for management including medications.  Transportation: We discussed options for transportation.  The patient will contact our office if he requires assistance with transportation.  Palliative care services: We have palliative care services available in the cancer center to discuss goals of care and advanced care planning.  Please let us  know if you have any questions or would like to speak to our palliative care practitioner.  Symptom Management Clinic: We discussed our symptom management clinic which is available for acute concerns while receiving treatment such as nausea, vomiting or diarrhea.  We can be reached via telephone at [phone].  We are available for virtual or in person visits on the same day from 9 to 4 PM Monday through Friday.   He denies needing specific assistance at this time.  I will refer him to our dietitian for support during therapy.  He will be followed by Dr. Trina Fujita clinical team.   Disposition: Weekly chemotherapy Provider appt 11/23/2023  Visit Diagnosis 1. Multiple myeloma not having achieved remission (HCC)     I discussed the assessment and treatment plan with the patient.  The patient was provided an opportunity to ask questions and all were answered. The patient expressed understanding and was in agreement with this plan. He also understands that he can call clinic at any time with any questions, concerns, or complaints.   I provided 40 minutes of face-to-face time during this encounter, and all of which was spent counseling as documented under my assessment & plan.   Reverie Vaquera A. Veverly Grace, PA-C [facility] [phone]

## 2023-10-25 NOTE — Progress Notes (Unsigned)
 [facility]  [address] [place],  [place]  [address] [phone]  Clinic Day:  10/26/2023  Referring physician: Madelyne Schiff, MD  HISTORY OF PRESENT ILLNESS:  The patient is a 68 y.o. 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  He recently reestablished follow-up in our clinic due to him having a pathologic fracture in his distal left femur.  He comes in today to go over his PET scan, as well as to restart treatment for his multiple myeloma.  Since his last visit, the patient has been doing okay.  He still has shoulder stiffness/pain, which he believes may be more arthritic in nature, versus it being related to his multiple myeloma.  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  A bone marrow done in June 2022 showed 14-15% plasma cells.  Cytogenetics were normal, but the myeloma FISH panel could not be done due to insufficient quantity.  The patient received 2 cycles of RVD therapy before stopping therapy for numerous months.  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  Due to jaw issues and other problems the patient claims was from from Revlimid , he was switched to Ixazomib in March 2023.  However, due to poor tolerance, he stopped this medicine on his own in September 2023.  PHYSICAL EXAM:  Blood pressure 107/82, pulse 77, temperature 98.5 F (36.9 C), temperature source Oral, resp. rate 16, SpO2 98%. Wt Readings from Last 3 Encounters:  10/09/23 180 lb (81.6 kg)  01/28/22 229 lb 9.6 oz (104.1 kg)  12/02/21 246 lb 12.8 oz (111.9 kg)   There is no height or weight on file to calculate BMI.  Performance status (ECOG): 1 - Symptomatic but completely ambulatory  Physical Exam Vitals and nursing note  reviewed.  Constitutional:      General: He is not in acute distress.    Appearance: Normal appearance. He is normal weight.     Comments: He appears weaker, thinner, and more frail versus his last visit.  He is in a wheelchair.  HENT:     Head: Normocephalic and atraumatic.     Mouth/Throat:     Mouth: Mucous membranes are moist.     Pharynx: Oropharynx is clear. No oropharyngeal exudate or posterior oropharyngeal erythema.  Eyes:     General: No scleral icterus.    Extraocular Movements: Extraocular movements intact.     Conjunctiva/sclera: Conjunctivae normal.     Pupils: Pupils are equal, round, and reactive to light.  Cardiovascular:     Rate and Rhythm: Normal rate and regular rhythm.     Heart sounds: Normal heart sounds. No murmur heard.    No friction rub. No gallop.  Pulmonary:     Effort: Pulmonary effort is normal.     Breath sounds: Normal breath sounds. No wheezing, rhonchi or rales.  Abdominal:     General: Bowel sounds are normal. There is no distension.     Palpations: Abdomen is soft. There is no hepatomegaly, splenomegaly or mass.     Tenderness: There is no abdominal tenderness.  Musculoskeletal:        General: Normal range of motion.     Cervical back: Normal range of motion and neck supple. No tenderness.  Thoracic back: No tenderness.     Right lower leg: No edema.     Left lower leg: No edema.  Lymphadenopathy:     Cervical: No cervical adenopathy.     Upper Body:     Right upper body: No supraclavicular or axillary adenopathy.     Left upper body: No supraclavicular or axillary adenopathy.     Lower Body: No right inguinal adenopathy. No left inguinal adenopathy.  Skin:    General: Skin is warm and dry.     Coloration: Skin is not jaundiced.     Findings: No rash.  Neurological:     Mental Status: He is alert and oriented to person, place, and time.     Cranial Nerves: No cranial nerve deficit.  Psychiatric:        Mood and Affect: Mood  normal.        Behavior: Behavior normal.        Thought Content: Thought content normal.   SCANS: His PET scan revealed the following: FINDINGS: Physiologic uptake: There may be expected metabolic uptake within the brain, tongue and floor of the mouth and larynx/vocal cords, heart, hila (many normal individuals have hilar uptake in less than 3 nodes with mildly avid hilar nodes less than 2.7 SUV), liver and spleen, GU system, and GI tract and symmetric muscle uptake. FDG AVID AND NON-AVID LESIONS. Reported avid SUV values (g/mL*) are maximum SUV.  NECK: There are no significant neck abnormalities.  CHEST: Chest wall-there are subtle areas of focal increased PET uptake within ribs. Specifically, the anterior 2nd rib on axial image 110 has an uptake value of 2.37 a posterior left 10th rib has uptake value of 3.29. Both are concerning for suspicious lesions however, there is no associated abnormality in either area noted on the noncontrasted CT scan. Axilla- There are no significant axillary abnormalities. Lung parenchyma- There are no significant lung parenchyma abnormalities. Mediastinum- There are no significant hilar or mediastinal adenopathy. Pleura- There are no significant pleural abnormalities.  There are also 2 areas of increased PET activity within the bony pelvis 1 in the left sacrum measuring uptake of 5.54 of the other in the left iliac with uptake of 4.52, as well as in the right coccyx without uptake value of 4.25. All of these lesions do not show correlative abnormality on the noncontrasted CT.  ABDOMEN: Stomach- No significant abnormalities. Liver- No significant abnormalities. Spleen- No significant abnormalities. Pancrease- No significant abnormalities. Kidneys- No significant abnormalities. Bowel- Normal bowel activity. Spine- No significant abnormalities.  PELVIS: Bowel- Normal physiologic bowel activity is identified. Masses- There are no pelvic masses. LOWER  EXTREMITIES: Bones-no other suspicious increased activity identified within  IMPRESSION: There are scattered increased focal areas of abnormal PET activity within an anterior right rib, posterior left rib, left sacrum, left iliac and right coccyx. However, there is no associated correlate of abnormality on the noncontrasted CT  No suspicious increased activity within any other long bones or skull  Incidental note on the CT scan there is a 5 mm stone in the proximal left ureter causing hydronephrosis and hydroureter. There are nonobstructing left renal stones and simple left renal cysts.  LABS:      Latest Ref Rng & Units 10/26/2023    9:01 AM 10/09/2023   11:04 AM 03/18/2022   12:00 AM  CBC  WBC 4.0 - 10.5 K/uL 6.9  8.1  8.2      Hemoglobin 13.0 - 17.0 g/dL 16.1  09.6  04.5  Hematocrit 39.0 - 52.0 % 40.0  37.9  42      Platelets 150 - 400 K/uL 233  190  226         This result is from an external source.      Latest Ref Rng & Units 10/09/2023   11:04 AM 03/18/2022   12:00 AM 01/21/2022   12:00 AM  CMP  Glucose 70 - 99 mg/dL 409     BUN 8 - 23 mg/dL 15  13     12       Creatinine 0.61 - 1.24 mg/dL 8.11  1.0     0.9      Sodium 135 - 145 mmol/L 137  133     135      Potassium 3.5 - 5.1 mmol/L 4.0  3.6     3.0      Chloride 98 - 111 mmol/L 100  102     98      CO2 22 - 32 mmol/L 25  27     29       Calcium  8.9 - 10.3 mg/dL 91.4  78.2     9.4      Total Protein 6.5 - 8.1 g/dL 7.1     Total Bilirubin 0.0 - 1.2 mg/dL 0.3     Alkaline Phos 38 - 126 U/L 109  79     67      AST 15 - 41 U/L 47  49     48      ALT 0 - 44 U/L 13  23     28          This result is from an external source.    Latest Reference Range & Units 03/18/22 10:46 10/09/23 11:04  M Protein SerPl Elph-Mcnc Not Observed g/dL 0.3 (H) (C) 0.5 (H) (C)  (H): Data is abnormally high (C): Corrected  Latest Reference Range & Units 03/18/22 10:46 10/09/23 11:04  Lambda free light chains 5.7 - 26.3 mg/L 45.6 (H) 274.7  (H)  (H): Data is abnormally high  ASSESSMENT & PLAN:  Assessment/Plan:  A 68 y.o. 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  In clinic today, I went over all of his PET scan images with him, for which he could see he does not have extensive disease throughout his skeletal system.  Nevertheless, he understands that his multiple myeloma warrants some form of treatment.  Based upon this, he will start his 1st cycle of daratumumab/Revlimid /Decadron  today.  Daratumumab will be given at 1800 mg subcutaneous weekly for the first 8 weeks.  Revlimid  will be given at 10 mg daily, every 3 out of 4 weeks.  Decadron  given at 20 mg weekly.  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  I will see this patient back in 4 weeks before he heads into his second cycle of dRD.  The patient understands all the plans discussed today and knows to contact our office before his next visit if he runs into any problems that require immediate clinical attention.    Florencia Zaccaro Felicia Horde, MD

## 2023-10-26 ENCOUNTER — Encounter: Payer: Self-pay | Admitting: Oncology

## 2023-10-26 ENCOUNTER — Telehealth: Payer: Self-pay | Admitting: Hematology and Oncology

## 2023-10-26 ENCOUNTER — Inpatient Hospital Stay: Attending: Oncology | Admitting: Oncology

## 2023-10-26 ENCOUNTER — Encounter: Payer: Self-pay | Admitting: Pharmacist

## 2023-10-26 ENCOUNTER — Inpatient Hospital Stay (HOSPITAL_BASED_OUTPATIENT_CLINIC_OR_DEPARTMENT_OTHER): Admitting: Hematology and Oncology

## 2023-10-26 ENCOUNTER — Inpatient Hospital Stay

## 2023-10-26 ENCOUNTER — Inpatient Hospital Stay: Admitting: Hematology and Oncology

## 2023-10-26 ENCOUNTER — Encounter: Payer: Self-pay | Admitting: Hematology and Oncology

## 2023-10-26 ENCOUNTER — Ambulatory Visit

## 2023-10-26 ENCOUNTER — Other Ambulatory Visit: Payer: Self-pay

## 2023-10-26 VITALS — BP 107/82 | HR 77 | Temp 98.5°F | Resp 16

## 2023-10-26 VITALS — BP 131/81 | HR 88 | Temp 98.0°F | Resp 18

## 2023-10-26 DIAGNOSIS — Z5112 Encounter for antineoplastic immunotherapy: Secondary | ICD-10-CM | POA: Insufficient documentation

## 2023-10-26 DIAGNOSIS — C9000 Multiple myeloma not having achieved remission: Secondary | ICD-10-CM

## 2023-10-26 LAB — CBC WITH DIFFERENTIAL (CANCER CENTER ONLY)
Abs Immature Granulocytes: 0.01 10*3/uL (ref 0.00–0.07)
Basophils Absolute: 0.1 10*3/uL (ref 0.0–0.1)
Basophils Relative: 1 %
Eosinophils Absolute: 0.4 10*3/uL (ref 0.0–0.5)
Eosinophils Relative: 5 %
HCT: 40 % (ref 39.0–52.0)
Hemoglobin: 13.1 g/dL (ref 13.0–17.0)
Immature Granulocytes: 0 %
Lymphocytes Relative: 28 %
Lymphs Abs: 1.9 10*3/uL (ref 0.7–4.0)
MCH: 30.4 pg (ref 26.0–34.0)
MCHC: 32.8 g/dL (ref 30.0–36.0)
MCV: 92.8 fL (ref 80.0–100.0)
Monocytes Absolute: 0.4 10*3/uL (ref 0.1–1.0)
Monocytes Relative: 6 %
Neutro Abs: 4.1 10*3/uL (ref 1.7–7.7)
Neutrophils Relative %: 60 %
Platelet Count: 233 10*3/uL (ref 150–400)
RBC: 4.31 MIL/uL (ref 4.22–5.81)
RDW: 16.5 % — ABNORMAL HIGH (ref 11.5–15.5)
WBC Count: 6.9 10*3/uL (ref 4.0–10.5)
nRBC: 0 % (ref 0.0–0.2)

## 2023-10-26 LAB — PRETREATMENT RBC PHENOTYPE

## 2023-10-26 LAB — TYPE AND SCREEN
ABO/RH(D): O POS
Antibody Screen: NEGATIVE

## 2023-10-26 MED ORDER — DEXAMETHASONE 4 MG PO TABS
12.0000 mg | ORAL_TABLET | Freq: Once | ORAL | Status: AC
  Administered 2023-10-26: 12 mg via ORAL
  Filled 2023-10-26: qty 3

## 2023-10-26 MED ORDER — DIPHENHYDRAMINE HCL 25 MG PO CAPS
50.0000 mg | ORAL_CAPSULE | Freq: Once | ORAL | Status: AC
  Administered 2023-10-26: 50 mg via ORAL
  Filled 2023-10-26: qty 2

## 2023-10-26 MED ORDER — DARATUMUMAB-HYALURONIDASE-FIHJ 1800-30000 MG-UT/15ML ~~LOC~~ SOLN
1800.0000 mg | Freq: Once | SUBCUTANEOUS | Status: AC
  Administered 2023-10-26: 1800 mg via SUBCUTANEOUS
  Filled 2023-10-26: qty 15

## 2023-10-26 MED ORDER — MONTELUKAST SODIUM 10 MG PO TABS
10.0000 mg | ORAL_TABLET | Freq: Once | ORAL | Status: AC
  Administered 2023-10-26: 10 mg via ORAL
  Filled 2023-10-26: qty 1

## 2023-10-26 MED ORDER — ACETAMINOPHEN 325 MG PO TABS
650.0000 mg | ORAL_TABLET | Freq: Once | ORAL | Status: AC
  Administered 2023-10-26: 650 mg via ORAL
  Filled 2023-10-26: qty 2

## 2023-10-26 NOTE — Patient Instructions (Signed)
 [facility] [place] - A DEPT OF [facility]  Discharge Instructions: Thank you for choosing [facility] to provide your oncology and hematology care.  If you have a lab appointment with the Cancer Center, please go directly to the Cancer Center and check in at the registration area.   Wear comfortable clothing and clothing appropriate for easy access to any Portacath or PICC line.   We strive to give you quality time with your provider. You may need to reschedule your appointment if you arrive late (15 or more minutes).  Arriving late affects you and other patients whose appointments are after yours.  Also, if you miss three or more appointments without notifying the office, you may be dismissed from the clinic at the provider's discretion.      For prescription refill requests, have your pharmacy contact our office and allow 72 hours for refills to be completed.    Today you received the following chemotherapy and/or immunotherapy agents Darzalex Faspro       To help prevent nausea and vomiting after your treatment, we encourage you to take your nausea medication as directed.  BELOW ARE SYMPTOMS THAT SHOULD BE REPORTED IMMEDIATELY: *FEVER GREATER THAN 100.4 F (38 C) OR HIGHER *CHILLS OR SWEATING *NAUSEA AND VOMITING THAT IS NOT CONTROLLED WITH YOUR NAUSEA MEDICATION *UNUSUAL SHORTNESS OF BREATH *UNUSUAL BRUISING OR BLEEDING *URINARY PROBLEMS (pain or burning when urinating, or frequent urination) *BOWEL PROBLEMS (unusual diarrhea, constipation, pain near the anus) TENDERNESS IN MOUTH AND THROAT WITH OR WITHOUT PRESENCE OF ULCERS (sore throat, sores in mouth, or a toothache) UNUSUAL RASH, SWELLING OR PAIN  UNUSUAL VAGINAL DISCHARGE OR ITCHING   Items with * indicate a potential emergency and should be followed up as soon as possible or go to the Emergency Department if any problems should occur.  Please show the CHEMOTHERAPY ALERT CARD or  IMMUNOTHERAPY ALERT CARD at check-in to the Emergency Department and triage nurse.  Should you have questions after your visit or need to cancel or reschedule your appointment, please contact [facility] [place] - A DEPT OF [facility]  Dept: [phone]  and follow the prompts.  Office hours are 8:00 a.m. to 4:30 p.m. Monday - Friday. Please note that voicemails left after 4:00 p.m. may not be returned until the following business day.  We are closed weekends and major holidays. You have access to a nurse at all times for urgent questions. Please call the main number to the clinic Dept: [phone] and follow the prompts.  For any non-urgent questions, you may also contact your provider using MyChart. We now offer e-Visits for anyone 66 and older to request care online for non-urgent symptoms. For details visit [email or web address].   Also download the MyChart app! Go to the app store, search "MyChart", open the app, select [facility], and log in with your MyChart username and password.

## 2023-10-26 NOTE — Progress Notes (Addendum)
..  Pharmacist Chemotherapy Monitoring - Initial Assessment    Anticipated start date: 10/26/23  Pt to take Dexamethasone  12 mg day of dara, then 8 mg following day x first month, then switch to 4 mg PO daily on Monday through Friday thereafter.  Patient in SNF so [facility] is sending orders for dex, acyclovir , and zofran  directly to them.  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  RBC phenotype drawn today  The following has been reviewed per standard work regarding the patient's treatment regimen: The patient's diagnosis, treatment plan and drug doses, and organ/hematologic function Lab orders and baseline tests specific to treatment regimen  The treatment plan start date, drug sequencing, and pre-medications Prior authorization status  Patient's documented medication list, including drug-drug interaction screen and prescriptions for anti-emetics and supportive care specific to the treatment regimen The drug concentrations, fluid compatibility, administration routes, and timing of the medications to be used The patient's access for treatment and lifetime cumulative dose history, if applicable  The patient's medication allergies and previous infusion related reactions, if applicable   Changes made to treatment plan:  N/A  Follow up needed:  N/A   Deretha Fleck, RPH, 10/26/2023  11:14 AM

## 2023-10-26 NOTE — Telephone Encounter (Signed)
 Patient has been scheduled for follow-up visit per 10/26/23 LOS.  Pt aware of scheduled appt details.   Pt resides at [facility], requested appts be at 1 pm.

## 2023-10-27 ENCOUNTER — Other Ambulatory Visit: Payer: Self-pay

## 2023-10-28 ENCOUNTER — Encounter: Payer: Self-pay | Admitting: Oncology

## 2023-10-28 ENCOUNTER — Telehealth: Payer: Self-pay

## 2023-10-28 NOTE — Telephone Encounter (Signed)
 I spoke with pt's nurse, Odilia Bennett. She reports pt had 1 episode of emesis the evening of first treatment, but no further emesis. He has had some nausea, and diarrhea. They have given him Zofran  SL, & Imodium  daily. He had 4 episodes of diarrhea on Monday, 2 episodes on Tuesday, and 1 episode this morning. He c/o headache yesterday. Afebrile.  She then transferred me to pt's room (Rm 110) to speak with him. Andrew Espinoza states he didn't sleep at all Monday night, had diarrhea and bowel spasms, and vomited once @ 0230.  He confirms he has had several episodes of diarrhea and has been given Imodium . He states he feels pretty good today. He denies fever, chills, emesis today, skin rash, itching, tingling/numbness in finger & toes, SOB, and chest pain. He occasionally has a cough, non productive. He has recently stopped smoking (stopped 2 months ago), after 48 years of smoking. I congratulated him on that achievement! Pt asked, will I just get my belly shot when I come on Monday? I told him he would get labs and injection. He voiced concern over getting labs because he said they have a hard time trying to find a vein. Pt does not have a port or PICC line. He is interested in port. I will notify Dr Harles Lied. Pt is to received his Revlimid  today. All above information sent to Dr Harles Lied, Amalia Badder [facility], & Kaitlyn Schomburg,RPH.

## 2023-10-28 NOTE — Telephone Encounter (Signed)
 Oncology Pharmacist Encounter   Attempted to call patient to go over the Revlimid  prescription. Patient should be receiving from [facility] today, 10/28/23 as [facility] is unable to send prescription to a rehab facility. Patient stated that someone would be home. I was unable to get patient on the phone and I left a voicemail.   Ely Spragg, PharmD Hematology/Oncology Clinical Pharmacist [facility] [phone]

## 2023-10-29 NOTE — Telephone Encounter (Signed)
 Oral Chemotherapy Pharmacist Encounter  I spoke with patient for overview of: Revlimid  for the treatment of multiple myeloma in conjunction with daratumumab and dexamethasone , planned duration until disease progression or unacceptable toxicity.   Counseled patient on administration, dosing, side effects, monitoring, drug-food interactions, safe handling, storage, and disposal.  Patient will take Revlimid  10mg  capsules, 1 capsule by mouth once daily, without regard to food, with a full glass of water. Revlimid  will be given 21 days on, 7 days off, repeat every 28 days.  Revlimid  start date: 10/28/2023  Adverse effects of Revlimid  include but are not limited to: nausea, constipation, diarrhea, abdominal pain, rash, fatigue, drug fever, and decreased blood counts.    Reviewed with patient importance of keeping a medication schedule and plan for any missed doses. No barriers to medication adherence identified.  Medication reconciliation performed and medication/allergy list updated. Patient is receiving lovenox injections at the rehab facility and they are aware that if they were to stop the lovenox injections that they would need to start a baby aspirin  at that time to decrease the risk of having a blood clot.   Revlimid  prescription is being dispensed from [facility] pharmacy as it is a limited distribution medication. All questions answered. Patient voiced understanding and appreciation.   Medication education handout placed in mail for patient. Patient knows to call the office with questions or concerns. Oral Chemotherapy Clinic phone number provided to patient.   Caleigha Zale, PharmD Hematology/Oncology Clinical Pharmacist [facility] Oral Chemotherapy Navigation [facility] [phone] 10/29/2023    2:33 PM

## 2023-11-02 ENCOUNTER — Inpatient Hospital Stay

## 2023-11-02 VITALS — BP 130/74 | HR 83 | Temp 98.0°F | Resp 18 | Wt 181.0 lb

## 2023-11-02 DIAGNOSIS — Z5112 Encounter for antineoplastic immunotherapy: Secondary | ICD-10-CM | POA: Diagnosis not present

## 2023-11-02 DIAGNOSIS — C9000 Multiple myeloma not having achieved remission: Secondary | ICD-10-CM

## 2023-11-02 LAB — CBC WITH DIFFERENTIAL (CANCER CENTER ONLY)
Abs Immature Granulocytes: 0.09 10*3/uL — ABNORMAL HIGH (ref 0.00–0.07)
Basophils Absolute: 0 10*3/uL (ref 0.0–0.1)
Basophils Relative: 0 %
Eosinophils Absolute: 0.7 10*3/uL — ABNORMAL HIGH (ref 0.0–0.5)
Eosinophils Relative: 9 %
HCT: 39.5 % (ref 39.0–52.0)
Hemoglobin: 12.8 g/dL — ABNORMAL LOW (ref 13.0–17.0)
Immature Granulocytes: 1 %
Lymphocytes Relative: 23 %
Lymphs Abs: 1.8 10*3/uL (ref 0.7–4.0)
MCH: 29.8 pg (ref 26.0–34.0)
MCHC: 32.4 g/dL (ref 30.0–36.0)
MCV: 92.1 fL (ref 80.0–100.0)
Monocytes Absolute: 0.5 10*3/uL (ref 0.1–1.0)
Monocytes Relative: 6 %
Neutro Abs: 4.8 10*3/uL (ref 1.7–7.7)
Neutrophils Relative %: 61 %
Platelet Count: 212 10*3/uL (ref 150–400)
RBC: 4.29 MIL/uL (ref 4.22–5.81)
RDW: 16.2 % — ABNORMAL HIGH (ref 11.5–15.5)
WBC Count: 7.9 10*3/uL (ref 4.0–10.5)
nRBC: 0 % (ref 0.0–0.2)

## 2023-11-02 MED ORDER — ACETAMINOPHEN 325 MG PO TABS
650.0000 mg | ORAL_TABLET | Freq: Once | ORAL | Status: AC
  Administered 2023-11-02: 650 mg via ORAL
  Filled 2023-11-02: qty 2

## 2023-11-02 MED ORDER — DEXAMETHASONE 4 MG PO TABS
12.0000 mg | ORAL_TABLET | Freq: Once | ORAL | Status: AC
  Administered 2023-11-02: 12 mg via ORAL
  Filled 2023-11-02: qty 3

## 2023-11-02 MED ORDER — DARATUMUMAB-HYALURONIDASE-FIHJ 1800-30000 MG-UT/15ML ~~LOC~~ SOLN
1800.0000 mg | Freq: Once | SUBCUTANEOUS | Status: AC
  Administered 2023-11-02: 1800 mg via SUBCUTANEOUS
  Filled 2023-11-02: qty 15

## 2023-11-02 MED ORDER — MONTELUKAST SODIUM 10 MG PO TABS
10.0000 mg | ORAL_TABLET | Freq: Once | ORAL | Status: AC
  Administered 2023-11-02: 10 mg via ORAL
  Filled 2023-11-02: qty 1

## 2023-11-02 MED ORDER — DIPHENHYDRAMINE HCL 25 MG PO CAPS
50.0000 mg | ORAL_CAPSULE | Freq: Once | ORAL | Status: AC
  Administered 2023-11-02: 50 mg via ORAL
  Filled 2023-11-02: qty 2

## 2023-11-02 NOTE — Patient Instructions (Signed)
 Azacitidine Injection What is this medication? AZACITIDINE (ay za SITE i deen) treats blood and bone marrow cancers. It works by slowing down the growth of cancer cells. This medicine may be used for other purposes; ask your health care provider or pharmacist if you have questions. COMMON BRAND NAME(S): Vidaza What should I tell my care team before I take this medication? They need to know if you have any of these conditions: Kidney disease Liver disease Low blood cell levels, such as low white cells, platelets, or red blood cells Low levels of albumin in the blood Low levels of bicarbonate in the blood An unusual or allergic reaction to azacitidine, mannitol, other medications, foods, dyes, or preservatives If you or your partner are pregnant or trying to get pregnant Breast-feeding How should I use this medication? This medication is injected into a vein or under the skin. It is given by your care team in a hospital or clinic setting. Talk to your care team about the use of this medication in children. While it may be prescribed for children as young as 1 month for selected conditions, precautions do apply. Overdosage: If you think you have taken too much of this medicine contact a poison control center or emergency room at once. NOTE: This medicine is only for you. Do not share this medicine with others. What if I miss a dose? Keep appointments for follow-up doses. It is important not to miss your dose. Call your care team if you are unable to keep an appointment. What may interact with this medication? Interactions are not expected. This list may not describe all possible interactions. Give your health care provider a list of all the medicines, herbs, non-prescription drugs, or dietary supplements you use. Also tell them if you smoke, drink alcohol, or use illegal drugs. Some items may interact with your medicine. What should I watch for while using this medication? Your condition will  be monitored carefully while you are receiving this medication. This medication may make you feel generally unwell. This is not uncommon as chemotherapy can affect healthy cells as well as cancer cells. Report any side effects. Continue your course of treatment even though you feel ill unless your care team tells you to stop. You may need blood work done while you are taking this medication. Other product types may be available that contain the medication azacitidine. The injection and oral products should not be used in place of one another. Talk to your care team if you have questions. This medication can cause serious side effects. To reduce the risk, your care team may give you other medications to take before receiving this one. Be sure to follow the directions from your care team. This medication may increase your risk of getting an infection. Call your care team for advice if you get a fever, chills, sore throat, or other symptoms of a cold or flu. Do not treat yourself. Try to avoid being around people who are sick. Avoid taking medications that contain aspirin, acetaminophen, ibuprofen, naproxen, or ketoprofen unless instructed by your care team. These medications may hide a fever. Be careful brushing or flossing your teeth or using a toothpick because you may get an infection or bleed more easily. If you have any dental work done, tell your dentist you are receiving this medication. Talk to your care team if you or your partner may be pregnant. Serious birth defects can occur if you take this medication during pregnancy and for 6 months after the  last dose. You will need a negative pregnancy test before starting this medication. Contraception is recommended while taking this medication and for 6 months after the last dose. Your care team can help you find the option that works for you. If your partner can get pregnant, use a condom during sex while taking this medication and for 3 months after the  last dose. Do not breastfeed while taking this medication and for 1 week after the last dose. This medication may cause infertility. Talk to your care team if you are concerned about your fertility. What side effects may I notice from receiving this medication? Side effects that you should report to your care team as soon as possible: Allergic reactions--skin rash, itching, hives, swelling of the face, lips, tongue, or throat Infection--fever, chills, cough, sore throat, wounds that don't heal, pain or trouble when passing urine, general feeling of discomfort or being unwell Kidney injury--decrease in the amount of urine, swelling of the ankles, hands, or feet Liver injury--right upper belly pain, loss of appetite, nausea, light-colored stool, dark yellow or brown urine, yellowing skin or eyes, unusual weakness or fatigue Low red blood cell level--unusual weakness or fatigue, dizziness, headache, trouble breathing Tumor lysis syndrome (TLS)--nausea, vomiting, diarrhea, decrease in the amount of urine, dark urine, unusual weakness or fatigue, confusion, muscle pain or cramps, fast or irregular heartbeat, joint pain Unusual bruising or bleeding Side effects that usually do not require medical attention (report to your care team if they continue or are bothersome): Constipation Diarrhea Nausea Pain, redness, or irritation at injection site Vomiting This list may not describe all possible side effects. Call your doctor for medical advice about side effects. You may report side effects to FDA at 1-800-FDA-1088. Where should I keep my medication? This medication is given in a hospital or clinic. It will not be stored at home. NOTE: This sheet is a summary. It may not cover all possible information. If you have questions about this medicine, talk to your doctor, pharmacist, or health care provider.  2024 Elsevier/Gold Standard (2023-01-05 00:00:00)

## 2023-11-03 NOTE — Addendum Note (Signed)
 Addended by: Luann Rundle on: 11/03/2023 09:17 AM   Modules accepted: Orders

## 2023-11-04 ENCOUNTER — Telehealth: Payer: Self-pay

## 2023-11-04 NOTE — Telephone Encounter (Signed)
 Attempted call to pt to see how he is tolerating the Revlimid . Any of following... N/V, constipation, diarrhea, abdominal pain, skin rash, itching, increased SOB, cough, chest pain, neuropathy, fever and chills?  What time is he taking the Revlimid ? Any missed doses? Confirm next appt is 11/09/2023 @ 1p for labs then infusion. Remind to call us  if he develops temp of 100.4 or higher, day or night.

## 2023-11-05 ENCOUNTER — Encounter: Payer: Self-pay | Admitting: Oncology

## 2023-11-05 NOTE — Telephone Encounter (Signed)
 Spoke with nurse Odilia Bennett at [facility] . Tolerating Revlimid  well , zofran  scheduled to take at 1pm daily per provider there 2pm revlimid  dose daily , no missed doses. Rarely ask for prn zofran  no vomiting. Had formed BM yesterday no constipation or diarrhea. No report of abdominal pain ,rash, itching,cough or chest pain. No Shortness of breath No neuropathy, no fever or chills. Follow up appt confirmed. They are aware to call with temp of 100.4 or higher day or night. Voiced his vital signs are stable and he is working with physical therapy. They transferred the call to his room no answer.

## 2023-11-06 ENCOUNTER — Other Ambulatory Visit: Payer: Self-pay

## 2023-11-09 ENCOUNTER — Inpatient Hospital Stay

## 2023-11-09 ENCOUNTER — Encounter: Payer: Self-pay | Admitting: Oncology

## 2023-11-09 VITALS — BP 124/80 | HR 63 | Temp 98.8°F | Resp 16

## 2023-11-09 DIAGNOSIS — C9000 Multiple myeloma not having achieved remission: Secondary | ICD-10-CM

## 2023-11-09 DIAGNOSIS — Z5112 Encounter for antineoplastic immunotherapy: Secondary | ICD-10-CM | POA: Diagnosis not present

## 2023-11-09 LAB — CBC WITH DIFFERENTIAL (CANCER CENTER ONLY)
Abs Immature Granulocytes: 0.1 10*3/uL — ABNORMAL HIGH (ref 0.00–0.07)
Basophils Absolute: 0 10*3/uL (ref 0.0–0.1)
Basophils Relative: 0 %
Eosinophils Absolute: 0 10*3/uL (ref 0.0–0.5)
Eosinophils Relative: 0 %
HCT: 38.3 % — ABNORMAL LOW (ref 39.0–52.0)
Hemoglobin: 12.9 g/dL — ABNORMAL LOW (ref 13.0–17.0)
Immature Granulocytes: 1 %
Lymphocytes Relative: 4 %
Lymphs Abs: 0.5 10*3/uL — ABNORMAL LOW (ref 0.7–4.0)
MCH: 30.8 pg (ref 26.0–34.0)
MCHC: 33.7 g/dL (ref 30.0–36.0)
MCV: 91.4 fL (ref 80.0–100.0)
Monocytes Absolute: 0.6 10*3/uL (ref 0.1–1.0)
Monocytes Relative: 5 %
Neutro Abs: 11.5 10*3/uL — ABNORMAL HIGH (ref 1.7–7.7)
Neutrophils Relative %: 90 %
Platelet Count: 158 10*3/uL (ref 150–400)
RBC: 4.19 MIL/uL — ABNORMAL LOW (ref 4.22–5.81)
RDW: 17.1 % — ABNORMAL HIGH (ref 11.5–15.5)
WBC Count: 12.8 10*3/uL — ABNORMAL HIGH (ref 4.0–10.5)
nRBC: 0 % (ref 0.0–0.2)

## 2023-11-09 MED ORDER — DEXAMETHASONE 4 MG PO TABS: 12.0000 mg | ORAL_TABLET | Freq: Once | ORAL | Status: DC

## 2023-11-09 MED ORDER — DIPHENHYDRAMINE HCL 25 MG PO CAPS
50.0000 mg | ORAL_CAPSULE | Freq: Once | ORAL | Status: AC
  Administered 2023-11-09: 50 mg via ORAL
  Filled 2023-11-09: qty 2

## 2023-11-09 MED ORDER — ACETAMINOPHEN 325 MG PO TABS
650.0000 mg | ORAL_TABLET | Freq: Once | ORAL | Status: AC
  Administered 2023-11-09: 650 mg via ORAL
  Filled 2023-11-09: qty 2

## 2023-11-09 MED ORDER — DARATUMUMAB-HYALURONIDASE-FIHJ 1800-30000 MG-UT/15ML ~~LOC~~ SOLN
1800.0000 mg | Freq: Once | SUBCUTANEOUS | Status: AC
  Administered 2023-11-09: 1800 mg via SUBCUTANEOUS
  Filled 2023-11-09: qty 15

## 2023-11-09 MED ORDER — MONTELUKAST SODIUM 10 MG PO TABS
10.0000 mg | ORAL_TABLET | Freq: Once | ORAL | Status: AC
  Administered 2023-11-09: 10 mg via ORAL
  Filled 2023-11-09: qty 1

## 2023-11-09 NOTE — Patient Instructions (Signed)
 [facility] [place] - A DEPT OF [facility]  Discharge Instructions: Thank you for choosing [facility] to provide your oncology and hematology care.  If you have a lab appointment with the Cancer Center, please go directly to the Cancer Center and check in at the registration area.   Wear comfortable clothing and clothing appropriate for easy access to any Portacath or PICC line.   We strive to give you quality time with your provider. You may need to reschedule your appointment if you arrive late (15 or more minutes).  Arriving late affects you and other patients whose appointments are after yours.  Also, if you miss three or more appointments without notifying the office, you may be dismissed from the clinic at the provider's discretion.      For prescription refill requests, have your pharmacy contact our office and allow 72 hours for refills to be completed.    Today you received the following chemotherapy and/or immunotherapy agents Darzalex Faspro       To help prevent nausea and vomiting after your treatment, we encourage you to take your nausea medication as directed.  BELOW ARE SYMPTOMS THAT SHOULD BE REPORTED IMMEDIATELY: *FEVER GREATER THAN 100.4 F (38 C) OR HIGHER *CHILLS OR SWEATING *NAUSEA AND VOMITING THAT IS NOT CONTROLLED WITH YOUR NAUSEA MEDICATION *UNUSUAL SHORTNESS OF BREATH *UNUSUAL BRUISING OR BLEEDING *URINARY PROBLEMS (pain or burning when urinating, or frequent urination) *BOWEL PROBLEMS (unusual diarrhea, constipation, pain near the anus) TENDERNESS IN MOUTH AND THROAT WITH OR WITHOUT PRESENCE OF ULCERS (sore throat, sores in mouth, or a toothache) UNUSUAL RASH, SWELLING OR PAIN  UNUSUAL VAGINAL DISCHARGE OR ITCHING   Items with * indicate a potential emergency and should be followed up as soon as possible or go to the Emergency Department if any problems should occur.  Please show the CHEMOTHERAPY ALERT CARD or  IMMUNOTHERAPY ALERT CARD at check-in to the Emergency Department and triage nurse.  Should you have questions after your visit or need to cancel or reschedule your appointment, please contact [facility] [place] - A DEPT OF [facility]  Dept: [phone]  and follow the prompts.  Office hours are 8:00 a.m. to 4:30 p.m. Monday - Friday. Please note that voicemails left after 4:00 p.m. may not be returned until the following business day.  We are closed weekends and major holidays. You have access to a nurse at all times for urgent questions. Please call the main number to the clinic Dept: [phone] and follow the prompts.  For any non-urgent questions, you may also contact your provider using MyChart. We now offer e-Visits for anyone 66 and older to request care online for non-urgent symptoms. For details visit [email or web address].   Also download the MyChart app! Go to the app store, search "MyChart", open the app, select [facility], and log in with your MyChart username and password.

## 2023-11-10 ENCOUNTER — Telehealth: Payer: Self-pay

## 2023-11-10 NOTE — Telephone Encounter (Signed)
 Opened in error

## 2023-11-16 ENCOUNTER — Inpatient Hospital Stay

## 2023-11-16 ENCOUNTER — Encounter: Payer: Self-pay | Admitting: Oncology

## 2023-11-16 ENCOUNTER — Other Ambulatory Visit: Payer: Self-pay | Admitting: Oncology

## 2023-11-16 VITALS — BP 122/80 | HR 74 | Temp 98.5°F | Resp 16

## 2023-11-16 DIAGNOSIS — C9000 Multiple myeloma not having achieved remission: Secondary | ICD-10-CM

## 2023-11-16 DIAGNOSIS — Z5112 Encounter for antineoplastic immunotherapy: Secondary | ICD-10-CM | POA: Diagnosis not present

## 2023-11-16 LAB — CBC WITH DIFFERENTIAL (CANCER CENTER ONLY)
Abs Immature Granulocytes: 0.01 10*3/uL (ref 0.00–0.07)
Basophils Absolute: 0 10*3/uL (ref 0.0–0.1)
Basophils Relative: 0 %
Eosinophils Absolute: 0.1 10*3/uL (ref 0.0–0.5)
Eosinophils Relative: 2 %
HCT: 41 % (ref 39.0–52.0)
Hemoglobin: 13.3 g/dL (ref 13.0–17.0)
Immature Granulocytes: 0 %
Immature Platelet Fraction: 1.5 % (ref 1.2–8.6)
Lymphocytes Relative: 12 %
Lymphs Abs: 0.5 10*3/uL — ABNORMAL LOW (ref 0.7–4.0)
MCH: 30.3 pg (ref 26.0–34.0)
MCHC: 32.4 g/dL (ref 30.0–36.0)
MCV: 93.4 fL (ref 80.0–100.0)
Monocytes Absolute: 0.3 10*3/uL (ref 0.1–1.0)
Monocytes Relative: 6 %
Neutro Abs: 3.6 10*3/uL (ref 1.7–7.7)
Neutrophils Relative %: 80 %
Platelet Count: 118 10*3/uL — ABNORMAL LOW (ref 150–400)
RBC: 4.39 MIL/uL (ref 4.22–5.81)
RDW: 17.4 % — ABNORMAL HIGH (ref 11.5–15.5)
WBC Count: 4.5 10*3/uL (ref 4.0–10.5)
nRBC: 0 % (ref 0.0–0.2)

## 2023-11-16 MED ORDER — DEXAMETHASONE 4 MG PO TABS
12.0000 mg | ORAL_TABLET | Freq: Once | ORAL | Status: DC
Start: 2023-11-16 — End: 2023-11-16

## 2023-11-16 MED ORDER — DARATUMUMAB-HYALURONIDASE-FIHJ 1800-30000 MG-UT/15ML ~~LOC~~ SOLN
1800.0000 mg | Freq: Once | SUBCUTANEOUS | Status: AC
  Administered 2023-11-16: 1800 mg via SUBCUTANEOUS
  Filled 2023-11-16: qty 15

## 2023-11-16 MED ORDER — DIPHENHYDRAMINE HCL 25 MG PO CAPS
50.0000 mg | ORAL_CAPSULE | Freq: Once | ORAL | Status: AC
  Administered 2023-11-16: 50 mg via ORAL
  Filled 2023-11-16: qty 2

## 2023-11-16 MED ORDER — ACETAMINOPHEN 325 MG PO TABS
650.0000 mg | ORAL_TABLET | Freq: Once | ORAL | Status: AC
  Administered 2023-11-16: 650 mg via ORAL
  Filled 2023-11-16: qty 2

## 2023-11-16 NOTE — Patient Instructions (Signed)
 [facility] [place] - A DEPT OF [facility]  Discharge Instructions: Thank you for choosing [facility] to provide your oncology and hematology care.  If you have a lab appointment with the Cancer Center, please go directly to the Cancer Center and check in at the registration area.   Wear comfortable clothing and clothing appropriate for easy access to any Portacath or PICC line.   We strive to give you quality time with your provider. You may need to reschedule your appointment if you arrive late (15 or more minutes).  Arriving late affects you and other patients whose appointments are after yours.  Also, if you miss three or more appointments without notifying the office, you may be dismissed from the clinic at the provider's discretion.      For prescription refill requests, have your pharmacy contact our office and allow 72 hours for refills to be completed.    Today you received the following chemotherapy and/or immunotherapy agents Darzalex Faspro       To help prevent nausea and vomiting after your treatment, we encourage you to take your nausea medication as directed.  BELOW ARE SYMPTOMS THAT SHOULD BE REPORTED IMMEDIATELY: *FEVER GREATER THAN 100.4 F (38 C) OR HIGHER *CHILLS OR SWEATING *NAUSEA AND VOMITING THAT IS NOT CONTROLLED WITH YOUR NAUSEA MEDICATION *UNUSUAL SHORTNESS OF BREATH *UNUSUAL BRUISING OR BLEEDING *URINARY PROBLEMS (pain or burning when urinating, or frequent urination) *BOWEL PROBLEMS (unusual diarrhea, constipation, pain near the anus) TENDERNESS IN MOUTH AND THROAT WITH OR WITHOUT PRESENCE OF ULCERS (sore throat, sores in mouth, or a toothache) UNUSUAL RASH, SWELLING OR PAIN  UNUSUAL VAGINAL DISCHARGE OR ITCHING   Items with * indicate a potential emergency and should be followed up as soon as possible or go to the Emergency Department if any problems should occur.  Please show the CHEMOTHERAPY ALERT CARD or  IMMUNOTHERAPY ALERT CARD at check-in to the Emergency Department and triage nurse.  Should you have questions after your visit or need to cancel or reschedule your appointment, please contact [facility] [place] - A DEPT OF [facility]  Dept: [phone]  and follow the prompts.  Office hours are 8:00 a.m. to 4:30 p.m. Monday - Friday. Please note that voicemails left after 4:00 p.m. may not be returned until the following business day.  We are closed weekends and major holidays. You have access to a nurse at all times for urgent questions. Please call the main number to the clinic Dept: [phone] and follow the prompts.  For any non-urgent questions, you may also contact your provider using MyChart. We now offer e-Visits for anyone 66 and older to request care online for non-urgent symptoms. For details visit [email or web address].   Also download the MyChart app! Go to the app store, search "MyChart", open the app, select [facility], and log in with your MyChart username and password.

## 2023-11-17 ENCOUNTER — Other Ambulatory Visit: Payer: Self-pay | Admitting: Oncology

## 2023-11-17 ENCOUNTER — Encounter: Payer: Self-pay | Admitting: Oncology

## 2023-11-17 DIAGNOSIS — C9000 Multiple myeloma not having achieved remission: Secondary | ICD-10-CM

## 2023-11-18 ENCOUNTER — Telehealth: Payer: Self-pay

## 2023-11-18 NOTE — Telephone Encounter (Signed)
 I called to speak with pt, but was told by his nurse, Rosina, that he was in therapy @ present. She states he is doing great. He is rolling himself around the facility. No N/V, rash, and fever. He took his last pill yesterday and started the 7 day break today. No missed doses. I reminded her to call us  if he were to develop temp of 100.4 or higher, day or night. She voiced understanding.

## 2023-11-22 ENCOUNTER — Other Ambulatory Visit: Payer: Self-pay | Admitting: Oncology

## 2023-11-22 DIAGNOSIS — C9000 Multiple myeloma not having achieved remission: Secondary | ICD-10-CM

## 2023-11-22 NOTE — Progress Notes (Signed)
 [facility]  [address] [place],  [place]  [address] [phone]  Clinic Day:  11/23/2023  Referring physician: Ezzard Valaria LABOR, MD  HISTORY OF PRESENT ILLNESS:  The patient is a 68 y.o. 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  He comes in today to be evaluated before heading into his 2nd cycle of daratumumab /Revlimid /Decadron  (drD).  The patient claims to have tolerated his first cycle of dRD very well.  He denies having worsening bone pain or other symptoms which concern him for overt signs of disease progression.  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  A bone marrow done in June 2022 showed 14-15% plasma cells.  Cytogenetics were normal, but the myeloma FISH panel could not be done due to insufficient quantity.  The patient received 2 cycles of RVD therapy before stopping therapy for numerous months.  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  Due to jaw issues and other problems the patient claims was from from Revlimid , he was switched to Ixazomib in March 2023.  However, due to poor tolerance, he stopped this medicine on his own in September 2023.  PHYSICAL EXAM:  There were no vitals taken for this visit. Wt Readings from Last 3 Encounters:  11/02/23 181 lb (82.1 kg)  10/09/23 180 lb (81.6 kg)  01/28/22 229 lb 9.6 oz (104.1 kg)   There is no height or weight on file to calculate BMI.  Performance status (ECOG): 1 - Symptomatic but completely ambulatory  Physical Exam Vitals and nursing note reviewed.  Constitutional:      General: He is not in acute distress.    Appearance: Normal appearance. He is normal weight.     Comments: He is in a wheelchair, but physically looks better versus  previous visits.  HENT:     Head: Normocephalic and atraumatic.     Mouth/Throat:     Mouth: Mucous membranes are moist.     Pharynx: Oropharynx is clear. No oropharyngeal exudate or posterior oropharyngeal erythema.  Eyes:     General: No scleral icterus.    Extraocular Movements: Extraocular movements intact.     Conjunctiva/sclera: Conjunctivae normal.     Pupils: Pupils are equal, round, and reactive to light.  Cardiovascular:     Rate and Rhythm: Normal rate and regular rhythm.     Heart sounds: Normal heart sounds. No murmur heard.    No friction rub. No gallop.  Pulmonary:     Effort: Pulmonary effort is normal.     Breath sounds: Normal breath sounds. No wheezing, rhonchi or rales.  Abdominal:     General: Bowel sounds are normal. There is no distension.     Palpations: Abdomen is soft. There is no hepatomegaly, splenomegaly or mass.     Tenderness: There is no abdominal tenderness.  Musculoskeletal:        General: Normal range of motion.     Cervical back: Normal range of motion and neck supple. No tenderness.     Thoracic back: No tenderness.     Right lower leg: No edema.     Left lower leg: No edema.  Lymphadenopathy:     Cervical: No cervical adenopathy.     Upper Body:  Right upper body: No supraclavicular or axillary adenopathy.     Left upper body: No supraclavicular or axillary adenopathy.     Lower Body: No right inguinal adenopathy. No left inguinal adenopathy.  Skin:    General: Skin is warm and dry.     Coloration: Skin is not jaundiced.     Findings: No rash.  Neurological:     Mental Status: He is alert and oriented to person, place, and time.     Cranial Nerves: No cranial nerve deficit.  Psychiatric:        Mood and Affect: Mood normal.        Behavior: Behavior normal.        Thought Content: Thought content normal.    LABS:      Latest Ref Rng & Units 11/30/2023    1:00 PM 11/23/2023    1:06 PM 11/16/2023   12:55 PM  CBC  WBC 4.0 -  10.5 K/uL 7.6  4.2  4.5   Hemoglobin 13.0 - 17.0 g/dL 86.3  86.1  86.6   Hematocrit 39.0 - 52.0 % 41.7  42.6  41.0   Platelets 150 - 400 K/uL 129  145  118       Latest Ref Rng & Units 10/09/2023   11:04 AM 03/18/2022   12:00 AM 01/21/2022   12:00 AM  CMP  Glucose 70 - 99 mg/dL 899     BUN 8 - 23 mg/dL 15  13     12       Creatinine 0.61 - 1.24 mg/dL 9.07  1.0     0.9      Sodium 135 - 145 mmol/L 137  133     135      Potassium 3.5 - 5.1 mmol/L 4.0  3.6     3.0      Chloride 98 - 111 mmol/L 100  102     98      CO2 22 - 32 mmol/L 25  27     29       Calcium  8.9 - 10.3 mg/dL 89.6  89.9     9.4      Total Protein 6.5 - 8.1 g/dL 7.1     Total Bilirubin 0.0 - 1.2 mg/dL 0.3     Alkaline Phos 38 - 126 U/L 109  79     67      AST 15 - 41 U/L 47  49     48      ALT 0 - 44 U/L 13  23     28          This result is from an external source.    Latest Reference Range & Units 10/09/23 11:04 11/23/23 13:06  M Protein SerPl Elph-Mcnc Not Observed g/dL 0.5 (H) (C) 0.1 (H) (C)  (H): Data is abnormally high (C): Corrected  Latest Reference Range & Units 10/09/23 11:04 11/23/23 13:06  Lambda free light chains 5.7 - 26.3 mg/L 274.7 (H) 33.5 (H)  (H): Data is abnormally high  ASSESSMENT & PLAN:  Assessment/Plan:  A 68 y.o. 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  I am very pleased if there is been a noticeable reduction in his myeloma parameters after just 1 cycle of daratumumab /Revlimid /Decadron .  Understandably, the patient was pleased with his results.  He will proceed with his second cycle of VRD today.  Clinically, he appears to be doing fairly well.  I will see this patient back in  4 weeks before he heads into his 3rd cycle of dRD.  The patient understands all the plans discussed today and is in agreement with them.  Aleea Hendry DELENA Kerns, MD

## 2023-11-23 ENCOUNTER — Inpatient Hospital Stay

## 2023-11-23 ENCOUNTER — Other Ambulatory Visit: Payer: Self-pay | Admitting: Oncology

## 2023-11-23 ENCOUNTER — Inpatient Hospital Stay: Attending: Oncology

## 2023-11-23 ENCOUNTER — Inpatient Hospital Stay (HOSPITAL_BASED_OUTPATIENT_CLINIC_OR_DEPARTMENT_OTHER): Admitting: Oncology

## 2023-11-23 VITALS — BP 111/80 | HR 84 | Temp 98.2°F | Resp 20

## 2023-11-23 DIAGNOSIS — Z5112 Encounter for antineoplastic immunotherapy: Secondary | ICD-10-CM | POA: Insufficient documentation

## 2023-11-23 DIAGNOSIS — C9000 Multiple myeloma not having achieved remission: Secondary | ICD-10-CM | POA: Diagnosis not present

## 2023-11-23 LAB — CBC WITH DIFFERENTIAL (CANCER CENTER ONLY)
Abs Immature Granulocytes: 0.03 K/uL (ref 0.00–0.07)
Basophils Absolute: 0 K/uL (ref 0.0–0.1)
Basophils Relative: 1 %
Eosinophils Absolute: 0.1 K/uL (ref 0.0–0.5)
Eosinophils Relative: 1 %
HCT: 42.6 % (ref 39.0–52.0)
Hemoglobin: 13.8 g/dL (ref 13.0–17.0)
Immature Granulocytes: 1 %
Lymphocytes Relative: 16 %
Lymphs Abs: 0.7 K/uL (ref 0.7–4.0)
MCH: 30 pg (ref 26.0–34.0)
MCHC: 32.4 g/dL (ref 30.0–36.0)
MCV: 92.6 fL (ref 80.0–100.0)
Monocytes Absolute: 0.3 K/uL (ref 0.1–1.0)
Monocytes Relative: 8 %
Neutro Abs: 3.1 K/uL (ref 1.7–7.7)
Neutrophils Relative %: 73 %
Platelet Count: 145 K/uL — ABNORMAL LOW (ref 150–400)
RBC: 4.6 MIL/uL (ref 4.22–5.81)
RDW: 17.2 % — ABNORMAL HIGH (ref 11.5–15.5)
WBC Count: 4.2 K/uL (ref 4.0–10.5)
nRBC: 0 % (ref 0.0–0.2)

## 2023-11-23 MED ORDER — ACETAMINOPHEN 325 MG PO TABS
650.0000 mg | ORAL_TABLET | Freq: Once | ORAL | Status: AC
  Administered 2023-11-23: 650 mg via ORAL
  Filled 2023-11-23: qty 2

## 2023-11-23 MED ORDER — DIPHENHYDRAMINE HCL 25 MG PO CAPS
50.0000 mg | ORAL_CAPSULE | Freq: Once | ORAL | Status: AC
  Administered 2023-11-23: 50 mg via ORAL
  Filled 2023-11-23: qty 2

## 2023-11-23 MED ORDER — DARATUMUMAB-HYALURONIDASE-FIHJ 1800-30000 MG-UT/15ML ~~LOC~~ SOLN
1800.0000 mg | Freq: Once | SUBCUTANEOUS | Status: AC
  Administered 2023-11-23: 1800 mg via SUBCUTANEOUS
  Filled 2023-11-23: qty 15

## 2023-11-23 MED ORDER — DEXAMETHASONE 4 MG PO TABS
4.0000 mg | ORAL_TABLET | Freq: Once | ORAL | Status: DC
Start: 2023-11-23 — End: 2023-11-23

## 2023-11-23 NOTE — Progress Notes (Signed)
 Per [facility] nurse, pt took dexadron 4mg  po at 0932 this morning.

## 2023-11-23 NOTE — Patient Instructions (Signed)
Daratumumab; Hyaluronidase Injection What is this medication? DARATUMUMAB; HYALURONIDASE (dar a toom ue mab; hye al ur ON i dase) treats multiple myeloma, a type of bone marrow cancer. Daratumumab works by blocking a protein that causes cancer cells to grow and multiply. This helps to slow or stop the spread of cancer cells. Hyaluronidase works by increasing the absorption of other medications in the body to help them work better. This medication may also be used treat amyloidosis, a condition that causes the buildup of a protein (amyloid) in your body. It works by reducing the buildup of this protein, which decreases symptoms. It is a combination medication that contains a monoclonal antibody. This medicine may be used for other purposes; ask your health care provider or pharmacist if you have questions. COMMON BRAND NAME(S): DARZALEX FASPRO What should I tell my care team before I take this medication? They need to know if you have any of these conditions: Heart disease Infection, such as chickenpox, cold sores, herpes, hepatitis B Lung or breathing disease An unusual or allergic reaction to daratumumab, hyaluronidase, other medications, foods, dyes, or preservatives Pregnant or trying to get pregnant Breast-feeding How should I use this medication? This medication is injected under the skin. It is given by your care team in a hospital or clinic setting. Talk to your care team about the use of this medication in children. Special care may be needed. Overdosage: If you think you have taken too much of this medicine contact a poison control center or emergency room at once. NOTE: This medicine is only for you. Do not share this medicine with others. What if I miss a dose? Keep appointments for follow-up doses. It is important not to miss your dose. Call your care team if you are unable to keep an appointment. What may interact with this medication? Interactions have not been studied. This list  may not describe all possible interactions. Give your health care provider a list of all the medicines, herbs, non-prescription drugs, or dietary supplements you use. Also tell them if you smoke, drink alcohol, or use illegal drugs. Some items may interact with your medicine. What should I watch for while using this medication? Your condition will be monitored carefully while you are receiving this medication. This medication can cause serious allergic reactions. To reduce your risk, your care team may give you other medication to take before receiving this one. Be sure to follow the directions from your care team. This medication can affect the results of blood tests to match your blood type. These changes can last for up to 6 months after the final dose. Your care team will do blood tests to match your blood type before you start treatment. Tell all of your care team that you are being treated with this medication before receiving a blood transfusion. This medication can affect the results of some tests used to determine treatment response; extra tests may be needed to evaluate response. Talk to your care team if you wish to become pregnant or think you are pregnant. This medication can cause serious birth defects if taken during pregnancy and for 3 months after the last dose. A reliable form of contraception is recommended while taking this medication and for 3 months after the last dose. Talk to your care team about effective forms of contraception. Do not breast-feed while taking this medication. What side effects may I notice from receiving this medication? Side effects that you should report to your care team as soon as   possible: Allergic reactions--skin rash, itching, hives, swelling of the face, lips, tongue, or throat Heart rhythm changes--fast or irregular heartbeat, dizziness, feeling faint or lightheaded, chest pain, trouble breathing Infection--fever, chills, cough, sore throat, wounds that  don't heal, pain or trouble when passing urine, general feeling of discomfort or being unwell Infusion reactions--chest pain, shortness of breath or trouble breathing, feeling faint or lightheaded Sudden eye pain or change in vision such as blurry vision, seeing halos around lights, vision loss Unusual bruising or bleeding Side effects that usually do not require medical attention (report to your care team if they continue or are bothersome): Constipation Diarrhea Fatigue Nausea Pain, tingling, or numbness in the hands or feet Swelling of the ankles, hands, or feet This list may not describe all possible side effects. Call your doctor for medical advice about side effects. You may report side effects to FDA at 1-800-FDA-1088. Where should I keep my medication? This medication is given in a hospital or clinic. It will not be stored at home. NOTE: This sheet is a summary. It may not cover all possible information. If you have questions about this medicine, talk to your doctor, pharmacist, or health care provider.  2024 Elsevier/Gold Standard (2021-09-10 00:00:00)  

## 2023-11-24 LAB — KAPPA/LAMBDA LIGHT CHAINS
Kappa free light chain: 18.2 mg/L (ref 3.3–19.4)
Kappa, lambda light chain ratio: 0.54 (ref 0.26–1.65)
Lambda free light chains: 33.5 mg/L — ABNORMAL HIGH (ref 5.7–26.3)

## 2023-11-27 LAB — MULTIPLE MYELOMA PANEL, SERUM
Albumin SerPl Elph-Mcnc: 3.7 g/dL (ref 2.9–4.4)
Albumin/Glob SerPl: 1.7 (ref 0.7–1.7)
Alpha 1: 0.3 g/dL (ref 0.0–0.4)
Alpha2 Glob SerPl Elph-Mcnc: 0.6 g/dL (ref 0.4–1.0)
B-Globulin SerPl Elph-Mcnc: 0.9 g/dL (ref 0.7–1.3)
Gamma Glob SerPl Elph-Mcnc: 0.4 g/dL (ref 0.4–1.8)
Globulin, Total: 2.2 g/dL (ref 2.2–3.9)
IgA: 152 mg/dL (ref 61–437)
IgG (Immunoglobin G), Serum: 450 mg/dL — ABNORMAL LOW (ref 603–1613)
IgM (Immunoglobulin M), Srm: 26 mg/dL (ref 20–172)
M Protein SerPl Elph-Mcnc: 0.1 g/dL — ABNORMAL HIGH
Total Protein ELP: 5.9 g/dL — ABNORMAL LOW (ref 6.0–8.5)

## 2023-11-30 ENCOUNTER — Inpatient Hospital Stay

## 2023-11-30 ENCOUNTER — Encounter: Payer: Self-pay | Admitting: Oncology

## 2023-11-30 ENCOUNTER — Telehealth: Payer: Self-pay

## 2023-11-30 ENCOUNTER — Other Ambulatory Visit: Payer: Self-pay | Admitting: Oncology

## 2023-11-30 ENCOUNTER — Other Ambulatory Visit: Payer: Self-pay

## 2023-11-30 VITALS — BP 117/82 | HR 78 | Temp 98.4°F | Resp 20

## 2023-11-30 DIAGNOSIS — Z5112 Encounter for antineoplastic immunotherapy: Secondary | ICD-10-CM | POA: Diagnosis not present

## 2023-11-30 DIAGNOSIS — C9000 Multiple myeloma not having achieved remission: Secondary | ICD-10-CM

## 2023-11-30 DIAGNOSIS — M545 Low back pain, unspecified: Secondary | ICD-10-CM

## 2023-11-30 LAB — CBC WITH DIFFERENTIAL (CANCER CENTER ONLY)
Abs Immature Granulocytes: 0.35 K/uL — ABNORMAL HIGH (ref 0.00–0.07)
Basophils Absolute: 0 K/uL (ref 0.0–0.1)
Basophils Relative: 1 %
Eosinophils Absolute: 0.1 K/uL (ref 0.0–0.5)
Eosinophils Relative: 1 %
HCT: 41.7 % (ref 39.0–52.0)
Hemoglobin: 13.6 g/dL (ref 13.0–17.0)
Immature Granulocytes: 5 %
Lymphocytes Relative: 10 %
Lymphs Abs: 0.7 K/uL (ref 0.7–4.0)
MCH: 30 pg (ref 26.0–34.0)
MCHC: 32.6 g/dL (ref 30.0–36.0)
MCV: 92.1 fL (ref 80.0–100.0)
Monocytes Absolute: 0.2 K/uL (ref 0.1–1.0)
Monocytes Relative: 2 %
Neutro Abs: 6.2 K/uL (ref 1.7–7.7)
Neutrophils Relative %: 81 %
Platelet Count: 129 K/uL — ABNORMAL LOW (ref 150–400)
RBC: 4.53 MIL/uL (ref 4.22–5.81)
RDW: 17.2 % — ABNORMAL HIGH (ref 11.5–15.5)
WBC Count: 7.6 K/uL (ref 4.0–10.5)
nRBC: 0 % (ref 0.0–0.2)

## 2023-11-30 MED ORDER — ACETAMINOPHEN 325 MG PO TABS
650.0000 mg | ORAL_TABLET | Freq: Once | ORAL | Status: AC
  Administered 2023-11-30: 650 mg via ORAL
  Filled 2023-11-30: qty 2

## 2023-11-30 MED ORDER — DIPHENHYDRAMINE HCL 25 MG PO CAPS
50.0000 mg | ORAL_CAPSULE | Freq: Once | ORAL | Status: AC
  Administered 2023-11-30: 50 mg via ORAL
  Filled 2023-11-30: qty 2

## 2023-11-30 MED ORDER — DARATUMUMAB-HYALURONIDASE-FIHJ 1800-30000 MG-UT/15ML ~~LOC~~ SOLN
1800.0000 mg | Freq: Once | SUBCUTANEOUS | Status: AC
  Administered 2023-11-30: 1800 mg via SUBCUTANEOUS
  Filled 2023-11-30: qty 15

## 2023-11-30 MED ORDER — HYDROCODONE-ACETAMINOPHEN 5-325 MG PO TABS: 1.0000 | ORAL_TABLET | ORAL | 0 refills | Status: DC | PRN

## 2023-11-30 MED ORDER — DEXAMETHASONE 4 MG PO TABS
4.0000 mg | ORAL_TABLET | Freq: Once | ORAL | Status: DC
Start: 2023-11-30 — End: 2023-11-30

## 2023-11-30 MED ORDER — HYDROCODONE-ACETAMINOPHEN 5-325 MG PO TABS
1.0000 | ORAL_TABLET | ORAL | 0 refills | Status: DC | PRN
Start: 2023-11-30 — End: 2024-02-19

## 2023-11-30 NOTE — Telephone Encounter (Signed)
 Pt is taking his Revlimid  . No missed does, no fever, no chills, no diarrhea, no constipation, and  no rash.  He states his only side effects are that he has an occasional cough, and that his skin feels cold sometimes and he has some sweating. Pt is aware to call us  if he develops temp of 100.4 or higher, day or night, as he has had different treatments in past.

## 2023-11-30 NOTE — Progress Notes (Signed)
 Patient states understanding ot take decadron  daily Monday- Friday. Patient also agrees to have home health nurse set up his pill box for him

## 2023-12-01 ENCOUNTER — Encounter: Payer: Self-pay | Admitting: Oncology

## 2023-12-04 ENCOUNTER — Other Ambulatory Visit: Payer: Self-pay

## 2023-12-04 DIAGNOSIS — Z5111 Encounter for antineoplastic chemotherapy: Secondary | ICD-10-CM

## 2023-12-04 DIAGNOSIS — Z7952 Long term (current) use of systemic steroids: Secondary | ICD-10-CM

## 2023-12-04 DIAGNOSIS — C9000 Multiple myeloma not having achieved remission: Secondary | ICD-10-CM

## 2023-12-04 MED ORDER — DEXAMETHASONE 4 MG PO TABS: ORAL_TABLET | ORAL | 1 refills | Status: AC

## 2023-12-05 ENCOUNTER — Other Ambulatory Visit: Payer: Self-pay

## 2023-12-07 ENCOUNTER — Other Ambulatory Visit

## 2023-12-07 ENCOUNTER — Inpatient Hospital Stay

## 2023-12-07 ENCOUNTER — Ambulatory Visit

## 2023-12-07 VITALS — BP 129/88 | HR 82 | Temp 98.3°F | Resp 18

## 2023-12-07 DIAGNOSIS — C9000 Multiple myeloma not having achieved remission: Secondary | ICD-10-CM

## 2023-12-07 DIAGNOSIS — Z5112 Encounter for antineoplastic immunotherapy: Secondary | ICD-10-CM | POA: Diagnosis not present

## 2023-12-07 LAB — CBC WITH DIFFERENTIAL (CANCER CENTER ONLY)
Abs Immature Granulocytes: 0.14 K/uL — ABNORMAL HIGH (ref 0.00–0.07)
Basophils Absolute: 0.1 K/uL (ref 0.0–0.1)
Basophils Relative: 1 %
Eosinophils Absolute: 0.2 K/uL (ref 0.0–0.5)
Eosinophils Relative: 2 %
HCT: 41.4 % (ref 39.0–52.0)
Hemoglobin: 13.6 g/dL (ref 13.0–17.0)
Immature Granulocytes: 2 %
Lymphocytes Relative: 14 %
Lymphs Abs: 1.4 K/uL (ref 0.7–4.0)
MCH: 30.1 pg (ref 26.0–34.0)
MCHC: 32.9 g/dL (ref 30.0–36.0)
MCV: 91.6 fL (ref 80.0–100.0)
Monocytes Absolute: 0.8 K/uL (ref 0.1–1.0)
Monocytes Relative: 8 %
Neutro Abs: 7 K/uL (ref 1.7–7.7)
Neutrophils Relative %: 73 %
Platelet Count: 115 K/uL — ABNORMAL LOW (ref 150–400)
RBC: 4.52 MIL/uL (ref 4.22–5.81)
RDW: 17.1 % — ABNORMAL HIGH (ref 11.5–15.5)
WBC Count: 9.6 K/uL (ref 4.0–10.5)
nRBC: 0 % (ref 0.0–0.2)

## 2023-12-07 MED ORDER — DEXAMETHASONE 4 MG PO TABS: 4.0000 mg | ORAL_TABLET | Freq: Once | ORAL | Status: DC

## 2023-12-07 MED ORDER — DIPHENHYDRAMINE HCL 25 MG PO CAPS
50.0000 mg | ORAL_CAPSULE | Freq: Once | ORAL | Status: AC
Start: 2023-12-07 — End: 2023-12-07
  Administered 2023-12-07: 50 mg via ORAL
  Filled 2023-12-07: qty 2

## 2023-12-07 MED ORDER — ACETAMINOPHEN 325 MG PO TABS
650.0000 mg | ORAL_TABLET | Freq: Once | ORAL | Status: AC
  Administered 2023-12-07: 650 mg via ORAL
  Filled 2023-12-07: qty 2

## 2023-12-07 MED ORDER — DARATUMUMAB-HYALURONIDASE-FIHJ 1800-30000 MG-UT/15ML ~~LOC~~ SOLN
1800.0000 mg | Freq: Once | SUBCUTANEOUS | Status: AC
  Administered 2023-12-07: 1800 mg via SUBCUTANEOUS
  Filled 2023-12-07: qty 15

## 2023-12-07 NOTE — Patient Instructions (Signed)
 [facility] [place] - A DEPT OF [facility]  Discharge Instructions: Thank you for choosing [facility] to provide your oncology and hematology care.  If you have a lab appointment with the Cancer Center, please go directly to the Cancer Center and check in at the registration area.   Wear comfortable clothing and clothing appropriate for easy access to any Portacath or PICC line.   We strive to give you quality time with your provider. You may need to reschedule your appointment if you arrive late (15 or more minutes).  Arriving late affects you and other patients whose appointments are after yours.  Also, if you miss three or more appointments without notifying the office, you may be dismissed from the clinic at the provider's discretion.      For prescription refill requests, have your pharmacy contact our office and allow 72 hours for refills to be completed.    Today you received the following chemotherapy and/or immunotherapy agents Darzalex Faspro       To help prevent nausea and vomiting after your treatment, we encourage you to take your nausea medication as directed.  BELOW ARE SYMPTOMS THAT SHOULD BE REPORTED IMMEDIATELY: *FEVER GREATER THAN 100.4 F (38 C) OR HIGHER *CHILLS OR SWEATING *NAUSEA AND VOMITING THAT IS NOT CONTROLLED WITH YOUR NAUSEA MEDICATION *UNUSUAL SHORTNESS OF BREATH *UNUSUAL BRUISING OR BLEEDING *URINARY PROBLEMS (pain or burning when urinating, or frequent urination) *BOWEL PROBLEMS (unusual diarrhea, constipation, pain near the anus) TENDERNESS IN MOUTH AND THROAT WITH OR WITHOUT PRESENCE OF ULCERS (sore throat, sores in mouth, or a toothache) UNUSUAL RASH, SWELLING OR PAIN  UNUSUAL VAGINAL DISCHARGE OR ITCHING   Items with * indicate a potential emergency and should be followed up as soon as possible or go to the Emergency Department if any problems should occur.  Please show the CHEMOTHERAPY ALERT CARD or  IMMUNOTHERAPY ALERT CARD at check-in to the Emergency Department and triage nurse.  Should you have questions after your visit or need to cancel or reschedule your appointment, please contact [facility] [place] - A DEPT OF [facility]  Dept: [phone]  and follow the prompts.  Office hours are 8:00 a.m. to 4:30 p.m. Monday - Friday. Please note that voicemails left after 4:00 p.m. may not be returned until the following business day.  We are closed weekends and major holidays. You have access to a nurse at all times for urgent questions. Please call the main number to the clinic Dept: [phone] and follow the prompts.  For any non-urgent questions, you may also contact your provider using MyChart. We now offer e-Visits for anyone 66 and older to request care online for non-urgent symptoms. For details visit [email or web address].   Also download the MyChart app! Go to the app store, search "MyChart", open the app, select [facility], and log in with your MyChart username and password.

## 2023-12-09 ENCOUNTER — Other Ambulatory Visit: Payer: Self-pay

## 2023-12-10 ENCOUNTER — Encounter: Payer: Self-pay | Admitting: Oncology

## 2023-12-14 ENCOUNTER — Ambulatory Visit

## 2023-12-14 ENCOUNTER — Other Ambulatory Visit: Payer: Self-pay

## 2023-12-14 ENCOUNTER — Other Ambulatory Visit

## 2023-12-14 ENCOUNTER — Inpatient Hospital Stay

## 2023-12-14 DIAGNOSIS — C9000 Multiple myeloma not having achieved remission: Secondary | ICD-10-CM

## 2023-12-15 ENCOUNTER — Encounter: Payer: Self-pay | Admitting: Oncology

## 2023-12-15 MED ORDER — LENALIDOMIDE 10 MG PO CAPS
10.0000 mg | ORAL_CAPSULE | Freq: Every day | ORAL | 0 refills | Status: DC
Start: 2023-12-15 — End: 2023-12-17

## 2023-12-17 ENCOUNTER — Other Ambulatory Visit: Payer: Self-pay

## 2023-12-17 DIAGNOSIS — C9000 Multiple myeloma not having achieved remission: Secondary | ICD-10-CM

## 2023-12-17 MED ORDER — LENALIDOMIDE 10 MG PO CAPS: 10.0000 mg | ORAL_CAPSULE | Freq: Every day | ORAL | 0 refills | Status: DC

## 2023-12-21 ENCOUNTER — Inpatient Hospital Stay: Attending: Oncology

## 2023-12-21 ENCOUNTER — Other Ambulatory Visit

## 2023-12-21 ENCOUNTER — Ambulatory Visit: Admitting: Oncology

## 2023-12-21 ENCOUNTER — Inpatient Hospital Stay

## 2023-12-21 ENCOUNTER — Other Ambulatory Visit: Payer: Self-pay

## 2023-12-21 ENCOUNTER — Inpatient Hospital Stay (HOSPITAL_BASED_OUTPATIENT_CLINIC_OR_DEPARTMENT_OTHER): Admitting: Hematology and Oncology

## 2023-12-21 ENCOUNTER — Ambulatory Visit

## 2023-12-21 VITALS — BP 113/75 | HR 91 | Temp 97.6°F | Resp 16

## 2023-12-21 DIAGNOSIS — C9000 Multiple myeloma not having achieved remission: Secondary | ICD-10-CM

## 2023-12-21 DIAGNOSIS — Z5112 Encounter for antineoplastic immunotherapy: Secondary | ICD-10-CM | POA: Diagnosis present

## 2023-12-21 LAB — CMP (CANCER CENTER ONLY)
ALT: 26 U/L (ref 0–44)
AST: 25 U/L (ref 15–41)
Albumin: 3.4 g/dL — ABNORMAL LOW (ref 3.5–5.0)
Alkaline Phosphatase: 76 U/L (ref 38–126)
Anion gap: 12 (ref 5–15)
BUN: 17 mg/dL (ref 8–23)
CO2: 22 mmol/L (ref 22–32)
Calcium: 8.7 mg/dL — ABNORMAL LOW (ref 8.9–10.3)
Chloride: 100 mmol/L (ref 98–111)
Creatinine: 1.3 mg/dL — ABNORMAL HIGH (ref 0.61–1.24)
GFR, Estimated: >60 mL/min (ref >60)
Glucose, Bld: 191 mg/dL — ABNORMAL HIGH (ref 70–99)
Potassium: 3.7 mmol/L (ref 3.5–5.1)
Sodium: 134 mmol/L — ABNORMAL LOW (ref 135–145)
Total Bilirubin: 0.2 mg/dL (ref 0.0–1.2)
Total Protein: 5.7 g/dL — ABNORMAL LOW (ref 6.5–8.1)

## 2023-12-21 LAB — CBC WITH DIFFERENTIAL (CANCER CENTER ONLY)
Abs Immature Granulocytes: 0.11 K/uL — ABNORMAL HIGH (ref 0.00–0.07)
Basophils Absolute: 0 K/uL (ref 0.0–0.1)
Basophils Relative: 1 %
Eosinophils Absolute: 0.1 K/uL (ref 0.0–0.5)
Eosinophils Relative: 2 %
HCT: 39.9 % (ref 39.0–52.0)
Hemoglobin: 13.2 g/dL (ref 13.0–17.0)
Immature Granulocytes: 2 %
Lymphocytes Relative: 12 %
Lymphs Abs: 0.6 K/uL — ABNORMAL LOW (ref 0.7–4.0)
MCH: 29.8 pg (ref 26.0–34.0)
MCHC: 33.1 g/dL (ref 30.0–36.0)
MCV: 90.1 fL (ref 80.0–100.0)
Monocytes Absolute: 0.3 K/uL (ref 0.1–1.0)
Monocytes Relative: 6 %
Neutro Abs: 4.1 K/uL (ref 1.7–7.7)
Neutrophils Relative %: 77 %
Platelet Count: 133 K/uL — ABNORMAL LOW (ref 150–400)
RBC: 4.43 MIL/uL (ref 4.22–5.81)
RDW: 16.7 % — ABNORMAL HIGH (ref 11.5–15.5)
WBC Count: 5.3 K/uL (ref 4.0–10.5)
nRBC: 0 % (ref 0.0–0.2)

## 2023-12-21 MED ORDER — ACETAMINOPHEN 325 MG PO TABS
650.0000 mg | ORAL_TABLET | Freq: Once | ORAL | Status: AC
  Administered 2023-12-21: 650 mg via ORAL
  Filled 2023-12-21: qty 2

## 2023-12-21 MED ORDER — DARATUMUMAB-HYALURONIDASE-FIHJ 1800-30000 MG-UT/15ML ~~LOC~~ SOLN
1800.0000 mg | Freq: Once | SUBCUTANEOUS | Status: AC
  Administered 2023-12-21: 1800 mg via SUBCUTANEOUS
  Filled 2023-12-21: qty 15

## 2023-12-21 MED ORDER — DIPHENHYDRAMINE HCL 25 MG PO CAPS
50.0000 mg | ORAL_CAPSULE | Freq: Once | ORAL | Status: AC
  Administered 2023-12-21: 50 mg via ORAL
  Filled 2023-12-21: qty 2

## 2023-12-21 MED ORDER — DEXAMETHASONE 4 MG PO TABS: 4.0000 mg | ORAL_TABLET | Freq: Once | ORAL | Status: DC

## 2023-12-21 NOTE — Progress Notes (Signed)
 [facility]  [address] [place],  [place]  [address] [phone]  Clinic Day:  11/23/2023  Referring physician: Ezzard Valaria LABOR, MD  HISTORY OF PRESENT ILLNESS:  The patient is a 68 y.o. 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  He comes in today to be evaluated before heading into his 2nd cycle of daratumumab /Revlimid /Decadron  (drD).  The patient claims to have tolerated his first cycle of dRD very well.  He denies having worsening bone pain or other symptoms which concern him for overt signs of disease progression.  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  A bone marrow done in June 2022 showed 14-15% plasma cells.  Cytogenetics were normal, but the myeloma FISH panel could not be done due to insufficient quantity.  The patient received 2 cycles of RVD therapy before stopping therapy for numerous months.  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  Due to jaw issues and other problems the patient claims was from from Revlimid , he was switched to Ixazomib in March 2023.  However, due to poor tolerance, he stopped this medicine on his own in September 2023.  PHYSICAL EXAM:  Blood pressure 113/75. Wt Readings from Last 3 Encounters:  11/02/23 181 lb (82.1 kg)  10/09/23 180 lb (81.6 kg)  01/28/22 229 lb 9.6 oz (104.1 kg)   There is no height or weight on file to calculate BMI.  Performance status (ECOG): 1 - Symptomatic but completely ambulatory  Physical Exam Vitals and nursing note reviewed.  Constitutional:      General: He is not in acute distress.    Appearance: Normal appearance. He is normal weight.     Comments: He is in a wheelchair, but physically looks better versus previous visits.   HENT:     Head: Normocephalic and atraumatic.     Mouth/Throat:     Mouth: Mucous membranes are moist.     Pharynx: Oropharynx is clear. No oropharyngeal exudate or posterior oropharyngeal erythema.  Eyes:     General: No scleral icterus.    Extraocular Movements: Extraocular movements intact.     Conjunctiva/sclera: Conjunctivae normal.     Pupils: Pupils are equal, round, and reactive to light.  Cardiovascular:     Rate and Rhythm: Normal rate and regular rhythm.     Heart sounds: Normal heart sounds. No murmur heard.    No friction rub. No gallop.  Pulmonary:     Effort: Pulmonary effort is normal.     Breath sounds: Normal breath sounds. No wheezing, rhonchi or rales.  Abdominal:     General: Bowel sounds are normal. There is no distension.     Palpations: Abdomen is soft. There is no hepatomegaly, splenomegaly or mass.     Tenderness: There is no abdominal tenderness.  Musculoskeletal:        General: Normal range of motion.     Cervical back: Normal range of motion and neck supple. No tenderness.     Thoracic back: No tenderness.     Right lower leg: No edema.     Left lower leg: No edema.  Lymphadenopathy:     Cervical: No cervical adenopathy.     Upper Body:     Right upper body: No  supraclavicular or axillary adenopathy.     Left upper body: No supraclavicular or axillary adenopathy.     Lower Body: No right inguinal adenopathy. No left inguinal adenopathy.  Skin:    General: Skin is warm and dry.     Coloration: Skin is not jaundiced.     Findings: No rash.  Neurological:     Mental Status: He is alert and oriented to person, place, and time.     Cranial Nerves: No cranial nerve deficit.  Psychiatric:        Mood and Affect: Mood normal.        Behavior: Behavior normal.        Thought Content: Thought content normal.    LABS:      Latest Ref Rng & Units 12/21/2023   10:26 AM 12/07/2023   10:45 AM 11/30/2023    1:00 PM  CBC  WBC 4.0 - 10.5 K/uL 5.3  9.6   7.6   Hemoglobin 13.0 - 17.0 g/dL 86.7  86.3  86.3   Hematocrit 39.0 - 52.0 % 39.9  41.4  41.7   Platelets 150 - 400 K/uL 133  115  129       Latest Ref Rng & Units 12/21/2023   10:26 AM 10/09/2023   11:04 AM 03/18/2022   12:00 AM  CMP  Glucose 70 - 99 mg/dL 808  899    BUN 8 - 23 mg/dL 17  15  13       Creatinine 0.61 - 1.24 mg/dL 8.69  9.07  1.0      Sodium 135 - 145 mmol/L 134  137  133      Potassium 3.5 - 5.1 mmol/L 3.7  4.0  3.6      Chloride 98 - 111 mmol/L 100  100  102      CO2 22 - 32 mmol/L 22  25  27       Calcium  8.9 - 10.3 mg/dL 8.7  89.6  89.9      Total Protein 6.5 - 8.1 g/dL 5.7  7.1    Total Bilirubin 0.0 - 1.2 mg/dL 0.2  0.3    Alkaline Phos 38 - 126 U/L 76  109  79      AST 15 - 41 U/L 25  47  49      ALT 0 - 44 U/L 26  13  23          This result is from an external source.    Latest Reference Range & Units 10/09/23 11:04 11/23/23 13:06  M Protein SerPl Elph-Mcnc Not Observed g/dL 0.5 (H) (C) 0.1 (H) (C)  (H): Data is abnormally high (C): Corrected  Latest Reference Range & Units 10/09/23 11:04 11/23/23 13:06  Lambda free light chains 5.7 - 26.3 mg/L 274.7 (H) 33.5 (H)  (H): Data is abnormally high  ASSESSMENT & PLAN:  Assessment/Plan:  A 68 y.o. 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 He will proceed with his second cycle of VRD today.  Clinically, he appears to be doing fairly well.  I will see this patient back in 4 weeks before he heads into his 3rd cycle of dRD.  The patient understands all the plans discussed today and is in agreement with them.  Eleanor DELENA Bach, NP

## 2023-12-21 NOTE — Patient Instructions (Signed)
Daratumumab; Hyaluronidase Injection What is this medication? DARATUMUMAB; HYALURONIDASE (dar a toom ue mab; hye al ur ON i dase) treats multiple myeloma, a type of bone marrow cancer. Daratumumab works by blocking a protein that causes cancer cells to grow and multiply. This helps to slow or stop the spread of cancer cells. Hyaluronidase works by increasing the absorption of other medications in the body to help them work better. This medication may also be used treat amyloidosis, a condition that causes the buildup of a protein (amyloid) in your body. It works by reducing the buildup of this protein, which decreases symptoms. It is a combination medication that contains a monoclonal antibody. This medicine may be used for other purposes; ask your health care provider or pharmacist if you have questions. COMMON BRAND NAME(S): DARZALEX FASPRO What should I tell my care team before I take this medication? They need to know if you have any of these conditions: Heart disease Infection, such as chickenpox, cold sores, herpes, hepatitis B Lung or breathing disease An unusual or allergic reaction to daratumumab, hyaluronidase, other medications, foods, dyes, or preservatives Pregnant or trying to get pregnant Breast-feeding How should I use this medication? This medication is injected under the skin. It is given by your care team in a hospital or clinic setting. Talk to your care team about the use of this medication in children. Special care may be needed. Overdosage: If you think you have taken too much of this medicine contact a poison control center or emergency room at once. NOTE: This medicine is only for you. Do not share this medicine with others. What if I miss a dose? Keep appointments for follow-up doses. It is important not to miss your dose. Call your care team if you are unable to keep an appointment. What may interact with this medication? Interactions have not been studied. This list  may not describe all possible interactions. Give your health care provider a list of all the medicines, herbs, non-prescription drugs, or dietary supplements you use. Also tell them if you smoke, drink alcohol, or use illegal drugs. Some items may interact with your medicine. What should I watch for while using this medication? Your condition will be monitored carefully while you are receiving this medication. This medication can cause serious allergic reactions. To reduce your risk, your care team may give you other medication to take before receiving this one. Be sure to follow the directions from your care team. This medication can affect the results of blood tests to match your blood type. These changes can last for up to 6 months after the final dose. Your care team will do blood tests to match your blood type before you start treatment. Tell all of your care team that you are being treated with this medication before receiving a blood transfusion. This medication can affect the results of some tests used to determine treatment response; extra tests may be needed to evaluate response. Talk to your care team if you wish to become pregnant or think you are pregnant. This medication can cause serious birth defects if taken during pregnancy and for 3 months after the last dose. A reliable form of contraception is recommended while taking this medication and for 3 months after the last dose. Talk to your care team about effective forms of contraception. Do not breast-feed while taking this medication. What side effects may I notice from receiving this medication? Side effects that you should report to your care team as soon as   possible: Allergic reactions--skin rash, itching, hives, swelling of the face, lips, tongue, or throat Heart rhythm changes--fast or irregular heartbeat, dizziness, feeling faint or lightheaded, chest pain, trouble breathing Infection--fever, chills, cough, sore throat, wounds that  don't heal, pain or trouble when passing urine, general feeling of discomfort or being unwell Infusion reactions--chest pain, shortness of breath or trouble breathing, feeling faint or lightheaded Sudden eye pain or change in vision such as blurry vision, seeing halos around lights, vision loss Unusual bruising or bleeding Side effects that usually do not require medical attention (report to your care team if they continue or are bothersome): Constipation Diarrhea Fatigue Nausea Pain, tingling, or numbness in the hands or feet Swelling of the ankles, hands, or feet This list may not describe all possible side effects. Call your doctor for medical advice about side effects. You may report side effects to FDA at 1-800-FDA-1088. Where should I keep my medication? This medication is given in a hospital or clinic. It will not be stored at home. NOTE: This sheet is a summary. It may not cover all possible information. If you have questions about this medicine, talk to your doctor, pharmacist, or health care provider.  2024 Elsevier/Gold Standard (2021-09-10 00:00:00)  

## 2023-12-22 ENCOUNTER — Other Ambulatory Visit: Payer: Self-pay

## 2023-12-22 ENCOUNTER — Encounter: Payer: Self-pay | Admitting: Oncology

## 2023-12-22 LAB — KAPPA/LAMBDA LIGHT CHAINS
Kappa free light chain: 24.4 mg/L — ABNORMAL HIGH (ref 3.3–19.4)
Kappa, lambda light chain ratio: 0.75 (ref 0.26–1.65)
Lambda free light chains: 32.7 mg/L — ABNORMAL HIGH (ref 5.7–26.3)

## 2023-12-24 LAB — MULTIPLE MYELOMA PANEL, SERUM
Albumin SerPl Elph-Mcnc: 3.3 g/dL (ref 2.9–4.4)
Albumin/Glob SerPl: 1.6 (ref 0.7–1.7)
Alpha 1: 0.3 g/dL (ref 0.0–0.4)
Alpha2 Glob SerPl Elph-Mcnc: 0.7 g/dL (ref 0.4–1.0)
B-Globulin SerPl Elph-Mcnc: 0.8 g/dL (ref 0.7–1.3)
Gamma Glob SerPl Elph-Mcnc: 0.3 g/dL — ABNORMAL LOW (ref 0.4–1.8)
Globulin, Total: 2.2 g/dL (ref 2.2–3.9)
IgA: 150 mg/dL (ref 61–437)
IgG (Immunoglobin G), Serum: 419 mg/dL — ABNORMAL LOW (ref 603–1613)
IgM (Immunoglobulin M), Srm: 18 mg/dL — ABNORMAL LOW (ref 20–172)
M Protein SerPl Elph-Mcnc: 0.1 g/dL — ABNORMAL HIGH
Total Protein ELP: 5.5 g/dL — ABNORMAL LOW (ref 6.0–8.5)

## 2023-12-28 ENCOUNTER — Inpatient Hospital Stay

## 2023-12-28 ENCOUNTER — Ambulatory Visit

## 2023-12-28 ENCOUNTER — Telehealth: Payer: Self-pay

## 2023-12-28 VITALS — BP 135/78 | HR 72 | Temp 98.2°F | Resp 18

## 2023-12-28 DIAGNOSIS — C9000 Multiple myeloma not having achieved remission: Secondary | ICD-10-CM

## 2023-12-28 DIAGNOSIS — Z5112 Encounter for antineoplastic immunotherapy: Secondary | ICD-10-CM | POA: Diagnosis not present

## 2023-12-28 LAB — CBC WITH DIFFERENTIAL (CANCER CENTER ONLY)
Abs Immature Granulocytes: 0.97 K/uL — ABNORMAL HIGH (ref 0.00–0.07)
Basophils Absolute: 0.1 K/uL (ref 0.0–0.1)
Basophils Relative: 1 %
Eosinophils Absolute: 0.1 K/uL (ref 0.0–0.5)
Eosinophils Relative: 1 %
HCT: 39.8 % (ref 39.0–52.0)
Hemoglobin: 13 g/dL (ref 13.0–17.0)
Immature Granulocytes: 9 %
Lymphocytes Relative: 8 %
Lymphs Abs: 0.9 K/uL (ref 0.7–4.0)
MCH: 29 pg (ref 26.0–34.0)
MCHC: 32.7 g/dL (ref 30.0–36.0)
MCV: 88.8 fL (ref 80.0–100.0)
Monocytes Absolute: 0.4 K/uL (ref 0.1–1.0)
Monocytes Relative: 3 %
Neutro Abs: 8 K/uL — ABNORMAL HIGH (ref 1.7–7.7)
Neutrophils Relative %: 78 %
Platelet Count: 169 K/uL (ref 150–400)
RBC: 4.48 MIL/uL (ref 4.22–5.81)
RDW: 16.7 % — ABNORMAL HIGH (ref 11.5–15.5)
Smear Review: NORMAL
WBC Count: 10.5 K/uL (ref 4.0–10.5)
nRBC: 0 % (ref 0.0–0.2)

## 2023-12-28 MED ORDER — DARATUMUMAB-HYALURONIDASE-FIHJ 1800-30000 MG-UT/15ML ~~LOC~~ SOLN
1800.0000 mg | Freq: Once | SUBCUTANEOUS | Status: AC
  Administered 2023-12-28 (×2): 1800 mg via SUBCUTANEOUS
  Filled 2023-12-28: qty 15

## 2023-12-28 MED ORDER — DIPHENHYDRAMINE HCL 25 MG PO CAPS
50.0000 mg | ORAL_CAPSULE | Freq: Once | ORAL | Status: AC
  Administered 2023-12-28 (×2): 50 mg via ORAL
  Filled 2023-12-28: qty 2

## 2023-12-28 MED ORDER — ACETAMINOPHEN 325 MG PO TABS
650.0000 mg | ORAL_TABLET | Freq: Once | ORAL | Status: AC
  Administered 2023-12-28 (×2): 650 mg via ORAL
  Filled 2023-12-28: qty 2

## 2023-12-28 MED ORDER — DEXAMETHASONE 4 MG PO TABS
4.0000 mg | ORAL_TABLET | Freq: Once | ORAL | Status: DC
  Filled 2023-12-28: qty 1

## 2023-12-28 NOTE — Patient Instructions (Signed)
 [facility] - A DEPT OF [facility]  Discharge Instructions: Thank you for choosing [facility] to provide your oncology and hematology care.  If you have a lab appointment with the Cancer Center, please go directly to the Cancer Center and check in at the registration area.   Wear comfortable clothing and clothing appropriate for easy access to any Portacath or PICC line.   We strive to give you quality time with your provider. You may need to reschedule your appointment if you arrive late (15 or more minutes).  Arriving late affects you and other patients whose appointments are after yours.  Also, if you miss three or more appointments without notifying the office, you may be dismissed from the clinic at the provider's discretion.      For prescription refill requests, have your pharmacy contact our office and allow 72 hours for refills to be completed.    Today you received the following chemotherapy and/or immunotherapy agents Darzalex  Faspro       To help prevent nausea and vomiting after your treatment, we encourage you to take your nausea medication as directed.  BELOW ARE SYMPTOMS THAT SHOULD BE REPORTED IMMEDIATELY: *FEVER GREATER THAN 100.4 F (38 C) OR HIGHER *CHILLS OR SWEATING *NAUSEA AND VOMITING THAT IS NOT CONTROLLED WITH YOUR NAUSEA MEDICATION *UNUSUAL SHORTNESS OF BREATH *UNUSUAL BRUISING OR BLEEDING *URINARY PROBLEMS (pain or burning when urinating, or frequent urination) *BOWEL PROBLEMS (unusual diarrhea, constipation, pain near the anus) TENDERNESS IN MOUTH AND THROAT WITH OR WITHOUT PRESENCE OF ULCERS (sore throat, sores in mouth, or a toothache) UNUSUAL RASH, SWELLING OR PAIN  UNUSUAL VAGINAL DISCHARGE OR ITCHING   Items with * indicate a potential emergency and should be followed up as soon as possible or go to the Emergency Department if any problems should occur.  Please show the CHEMOTHERAPY ALERT CARD or  IMMUNOTHERAPY ALERT CARD at check-in to the Emergency Department and triage nurse.  Should you have questions after your visit or need to cancel or reschedule your appointment, please contact [facility]  - A DEPT OF [facility]  Dept: [phone]  and follow the prompts.  Office hours are 8:00 a.m. to 4:30 p.m. Monday - Friday. Please note that voicemails left after 4:00 p.m. may not be returned until the following business day.  We are closed weekends and major holidays. You have access to a nurse at all times for urgent questions. Please call the main number to the clinic Dept: [phone] and follow the prompts.  For any non-urgent questions, you may also contact your provider using MyChart. We now offer e-Visits for anyone 55 and older to request care online for non-urgent symptoms. For details visit [email or web address].   Also download the MyChart app! Go to the app store, search MyChart, open the app, select [place], and log in with your MyChart username and password.

## 2023-12-28 NOTE — Telephone Encounter (Signed)
 Patient called and left message regarding he is have IBS symptoms with diarrhea and would like for his appointment to be later today if possible.

## 2024-01-03 ENCOUNTER — Encounter: Payer: Self-pay | Admitting: Oncology

## 2024-01-04 ENCOUNTER — Other Ambulatory Visit (HOSPITAL_BASED_OUTPATIENT_CLINIC_OR_DEPARTMENT_OTHER): Payer: Self-pay

## 2024-01-04 ENCOUNTER — Ambulatory Visit: Admitting: Oncology

## 2024-01-04 ENCOUNTER — Other Ambulatory Visit: Payer: Self-pay | Admitting: Hematology and Oncology

## 2024-01-04 ENCOUNTER — Telehealth: Payer: Self-pay

## 2024-01-04 ENCOUNTER — Ambulatory Visit (INDEPENDENT_AMBULATORY_CARE_PROVIDER_SITE_OTHER)
Admission: RE | Admit: 2024-01-04 | Discharge: 2024-01-04 | Disposition: A | Discharge status: Home / Self Care | Source: Ambulatory Visit | Attending: Hematology and Oncology | Admitting: Hematology and Oncology

## 2024-01-04 ENCOUNTER — Ambulatory Visit

## 2024-01-04 ENCOUNTER — Inpatient Hospital Stay (HOSPITAL_BASED_OUTPATIENT_CLINIC_OR_DEPARTMENT_OTHER): Admitting: Hematology and Oncology

## 2024-01-04 ENCOUNTER — Other Ambulatory Visit

## 2024-01-04 DIAGNOSIS — M7989 Other specified soft tissue disorders: Secondary | ICD-10-CM | POA: Diagnosis not present

## 2024-01-04 DIAGNOSIS — Z5112 Encounter for antineoplastic immunotherapy: Secondary | ICD-10-CM | POA: Diagnosis not present

## 2024-01-04 MED ORDER — APIXABAN (ELIQUIS) VTE STARTER PACK (10MG AND 5MG)
ORAL_TABLET | ORAL | 0 refills | Status: DC
Start: 2024-01-04 — End: 2024-02-03
  Filled 2024-01-04: qty 74, 30d supply, fill #0

## 2024-01-04 NOTE — Progress Notes (Signed)
 [facility]  [address] [place],  [place]  [address] [phone]  Clinic Day:  11/23/2023  Referring physician: Dottie Norleen PHEBE PONCE, MD  HISTORY OF PRESENT ILLNESS:  The patient is a 68 y.o. 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  He is currently receiving daratumumab /Revlimid /Decadron  (drD).  He is here for symptom management. He notes that his right leg has been swollen for the last several weeks. He denies pain.  PHYSICAL EXAM:  There were no vitals taken for this visit. Wt Readings from Last 3 Encounters:  11/02/23 181 lb (82.1 kg)  10/09/23 180 lb (81.6 kg)  01/28/22 229 lb 9.6 oz (104.1 kg)   There is no height or weight on file to calculate BMI.  Performance status (ECOG): 1 - Symptomatic but completely ambulatory  Physical Exam Vitals and nursing note reviewed.  Constitutional:      General: He is not in acute distress.    Appearance: Normal appearance. He is normal weight.     Comments: He is in a wheelchair, but physically looks better versus previous visits.  HENT:     Head: Normocephalic and atraumatic.     Mouth/Throat:     Mouth: Mucous membranes are moist.     Pharynx: Oropharynx is clear. No oropharyngeal exudate or posterior oropharyngeal erythema.  Eyes:     General: No scleral icterus.    Extraocular Movements: Extraocular movements intact.     Conjunctiva/sclera: Conjunctivae normal.     Pupils: Pupils are equal, round, and reactive to light.  Cardiovascular:     Rate and Rhythm: Normal rate and regular rhythm.     Heart sounds: Normal heart sounds. No murmur heard.    No friction rub. No gallop.  Pulmonary:     Effort: Pulmonary effort is normal.     Breath sounds: Normal breath sounds. No wheezing, rhonchi or rales.  Abdominal:     General: Bowel sounds are normal. There is no distension.     Palpations: Abdomen is soft. There is no hepatomegaly, splenomegaly or mass.     Tenderness: There  is no abdominal tenderness.  Musculoskeletal:        General: Normal range of motion.     Cervical back: Normal range of motion and neck supple. No tenderness.     Thoracic back: No tenderness.     Right lower leg: Edema present.     Left lower leg: No edema.  Lymphadenopathy:     Cervical: No cervical adenopathy.     Upper Body:     Right upper body: No supraclavicular or axillary adenopathy.     Left upper body: No supraclavicular or axillary adenopathy.     Lower Body: No right inguinal adenopathy. No left inguinal adenopathy.  Skin:    General: Skin is warm and dry.     Coloration: Skin is not jaundiced.     Findings: No rash.  Neurological:     Mental Status: He is alert and oriented to person, place, and time.     Cranial Nerves: No cranial nerve deficit.  Psychiatric:        Mood and Affect: Mood normal.        Behavior: Behavior normal.        Thought Content: Thought content normal.    LABS:      Latest Ref Rng & Units 12/28/2023   10:15 AM 12/21/2023   10:26 AM 12/07/2023   10:45 AM  CBC  WBC 4.0 - 10.5 K/uL 10.5  5.3  9.6   Hemoglobin 13.0 - 17.0 g/dL 86.9  86.7  86.3   Hematocrit 39.0 - 52.0 % 39.8  39.9  41.4   Platelets 150 - 400 K/uL 169  133  115       Latest Ref Rng & Units 12/21/2023   10:26 AM 10/09/2023   11:04 AM 03/18/2022   12:00 AM  CMP  Glucose 70 - 99 mg/dL 808  899    BUN 8 - 23 mg/dL 17  15  13       Creatinine 0.61 - 1.24 mg/dL 8.69  9.07  1.0      Sodium 135 - 145 mmol/L 134  137  133      Potassium 3.5 - 5.1 mmol/L 3.7  4.0  3.6      Chloride 98 - 111 mmol/L 100  100  102      CO2 22 - 32 mmol/L 22  25  27       Calcium  8.9 - 10.3 mg/dL 8.7  89.6  89.9      Total Protein 6.5 - 8.1 g/dL 5.7  7.1    Total Bilirubin 0.0 - 1.2 mg/dL 0.2  0.3    Alkaline Phos 38 - 126 U/L 76  109  79      AST 15 - 41 U/L 25  47  49      ALT 0 - 44 U/L 26  13  23          This result is from an external source.    Latest Reference Range & Units 10/09/23  11:04 11/23/23 13:06  M Protein SerPl Elph-Mcnc Not Observed g/dL 0.5 (H) (C) 0.1 (H) (C)  (H): Data is abnormally high (C): Corrected  Latest Reference Range & Units 10/09/23 11:04 11/23/23 13:06  Lambda free light chains 5.7 - 26.3 mg/L 274.7 (H) 33.5 (H)  (H): Data is abnormally high  ASSESSMENT & PLAN:  Assessment/Plan:  A 68 y.o. 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 He has significant swelling to his right lower extremity and states it has been that way for several weeks. He denies pain. It is not red or warm to touch. Ultrasound today revealed Extensive, occlusive deep venous thrombosis noted throughout the deep veins of the right lower extremity as detailed. We will start Eliquis . He will pick up the starter kit from the [employer] and begin that today. He will keep his appointment for next Monday for evaluation with Dr. Ezzard.   Eleanor DELENA Bach, NP

## 2024-01-04 NOTE — Telephone Encounter (Signed)
 Pt came into clinic as he had visits today on his calendar. When he was discharged from SNF, he was given appt's for today. The appt's today were cancelled in July. Pt requests that if there is a change in his schedule, that he receive a personal text to his personal cell phone. He made the trip today from [place] and states, You don't know what I've been through to get here this morning. He asked if anyone could please see him?. Pt showed me his right leg, which is very swollen and tight, from right knee down to ankle. No redness. No warmth to touch. He denies pain. There are several scabbed areas noted on the leg. He states its where he has hit his legs on things. The swelling has been there for about a month per pt. He also voiced frustration that he hadn't received his lab results from the 7 tubes they drew last week. Pt in Rm 1 @ nurses station. He is non ambulatory, so in w/c. I reported all of above to Melissa,NP. She agreed to see pt. He was added to her schedule. She recommends an ultrasound to the right leg be done today, and is going to review the labs from last week for any major changes. She recommends that pt keep his appt with Dr Ezzard next week for more in depth discussion of his multiple myeloma and light chain results.   Pt continues to take his Revlimid  and decadron  as ordered. No missed doses of Revlimid . He is on his 2nd week of the 3rd cycle. He takes a Zofran  1 hr before he takes the Revlimid  @ 2pm. No N/V, mouth sores, cough, SOB, fever and chills. He admits that he has had IBS w/ diarrhea since he was a child. When he gets stressed out, he has issues with diarrhea. He takes Imodium  PRN. He reports all last week he had nice solid Bm's. He is taking fiber daily. On another note, pt mentions that he recently received an injection in his left knee. He states Dr Erica told him that it is bone on bone. They are planning to try some gel injections before trying surgery option. Pt does have knee  immobilizer on the left knee. No swelling in left knee or leg/ankle.

## 2024-01-08 ENCOUNTER — Encounter: Payer: Self-pay | Admitting: Oncology

## 2024-01-10 ENCOUNTER — Other Ambulatory Visit: Payer: Self-pay

## 2024-01-10 NOTE — Progress Notes (Unsigned)
 [facility] [address] [place],  [place]  [address] [phone]  Clinic Day:  01/11/2024  Referring physician: Dottie Norleen PHEBE PONCE, MD  HISTORY OF PRESENT ILLNESS:  The patient is a 68 y.o. 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  He comes in today to be evaluated before heading into his 4th cycle of daratumumab /Revlimid /Decadron  (drD).  The patient claims to have tolerated his first cycle of dRD very well.  He denies having worsening bone pain or other symptoms which concern him for overt signs of disease progression.  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  A bone marrow done in June 2022 showed 14-15% plasma cells.  Cytogenetics were normal, but the myeloma FISH panel could not be done due to insufficient quantity.  The patient received 2 cycles of RVD therapy before stopping therapy for numerous months.  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  Due to jaw issues and other problems the patient claims was from from Revlimid , he was switched to Ixazomib in March 2023.  However, due to poor tolerance, he stopped this medicine on his own in September 2023.  PHYSICAL EXAM:  There were no vitals taken for this visit. Wt Readings from Last 3 Encounters:  11/02/23 181 lb (82.1 kg)  10/09/23 180 lb (81.6 kg)  01/28/22 229 lb 9.6 oz (104.1 kg)   There is no height or weight on file to calculate BMI.  Performance status (ECOG): 1 - Symptomatic but completely ambulatory  Physical Exam Vitals and nursing note reviewed.  Constitutional:      General: He is not in acute distress.    Appearance: Normal appearance. He is normal weight.     Comments: He is in a wheelchair, but physically looks better versus  previous visits.  HENT:     Head: Normocephalic and atraumatic.     Mouth/Throat:     Mouth: Mucous membranes are moist.     Pharynx: Oropharynx is clear. No oropharyngeal exudate or posterior oropharyngeal erythema.  Eyes:     General: No scleral icterus.    Extraocular Movements: Extraocular movements intact.     Conjunctiva/sclera: Conjunctivae normal.     Pupils: Pupils are equal, round, and reactive to light.  Cardiovascular:     Rate and Rhythm: Normal rate and regular rhythm.     Heart sounds: Normal heart sounds. No murmur heard.    No friction rub. No gallop.  Pulmonary:     Effort: Pulmonary effort is normal.     Breath sounds: Normal breath sounds. No wheezing, rhonchi or rales.  Abdominal:     General: Bowel sounds are normal. There is no distension.     Palpations: Abdomen is soft. There is no hepatomegaly, splenomegaly or mass.     Tenderness: There is no abdominal tenderness.  Musculoskeletal:        General: Normal range of motion.     Cervical back: Normal range of motion and neck supple. No tenderness.     Thoracic back: No tenderness.     Right lower leg: No edema.     Left lower leg: No edema.  Lymphadenopathy:     Cervical: No cervical adenopathy.     Upper Body:  Right upper body: No supraclavicular or axillary adenopathy.     Left upper body: No supraclavicular or axillary adenopathy.     Lower Body: No right inguinal adenopathy. No left inguinal adenopathy.  Skin:    General: Skin is warm and dry.     Coloration: Skin is not jaundiced.     Findings: No rash.  Neurological:     Mental Status: He is alert and oriented to person, place, and time.     Cranial Nerves: No cranial nerve deficit.  Psychiatric:        Mood and Affect: Mood normal.        Behavior: Behavior normal.        Thought Content: Thought content normal.    LABS:      Latest Ref Rng & Units 12/28/2023   10:15 AM 12/21/2023   10:26 AM 12/07/2023   10:45 AM  CBC  WBC 4.0 -  10.5 K/uL 10.5  5.3  9.6   Hemoglobin 13.0 - 17.0 g/dL 86.9  86.7  86.3   Hematocrit 39.0 - 52.0 % 39.8  39.9  41.4   Platelets 150 - 400 K/uL 169  133  115       Latest Ref Rng & Units 12/21/2023   10:26 AM 10/09/2023   11:04 AM 03/18/2022   12:00 AM  CMP  Glucose 70 - 99 mg/dL 808  899    BUN 8 - 23 mg/dL 17  15  13       Creatinine 0.61 - 1.24 mg/dL 8.69  9.07  1.0      Sodium 135 - 145 mmol/L 134  137  133      Potassium 3.5 - 5.1 mmol/L 3.7  4.0  3.6      Chloride 98 - 111 mmol/L 100  100  102      CO2 22 - 32 mmol/L 22  25  27       Calcium  8.9 - 10.3 mg/dL 8.7  89.6  89.9      Total Protein 6.5 - 8.1 g/dL 5.7  7.1    Total Bilirubin 0.0 - 1.2 mg/dL 0.2  0.3    Alkaline Phos 38 - 126 U/L 76  109  79      AST 15 - 41 U/L 25  47  49      ALT 0 - 44 U/L 26  13  23          This result is from an external source.     M Protein SerPl Elph-Mcnc 0.1 High  VC 0.1 High  VC     Latest Reference Range & Units 11/23/23 13:06 12/21/23 10:26  Lambda free light chains 5.7 - 26.3 mg/L 33.5 (H) 32.7 (H)  (H): Data is abnormally high ASSESSMENT & PLAN:  Assessment/Plan:  A 68 y.o. 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  I am very pleased if there is been a noticeable reduction in his myeloma parameters after just 1 cycle of daratumumab /Revlimid /Decadron .  Understandably, the patient was pleased with his results.  He will proceed with his second cycle of VRD today.  Clinically, he appears to be doing fairly well.  I will see this patient back in 4 weeks before he heads into his 3rd cycle of dRD.  The patient understands all the plans discussed today and is in agreement with them.  Larone Kliethermes DELENA Kerns, MD

## 2024-01-11 ENCOUNTER — Telehealth: Payer: Self-pay

## 2024-01-11 ENCOUNTER — Inpatient Hospital Stay

## 2024-01-11 ENCOUNTER — Other Ambulatory Visit: Payer: Self-pay | Admitting: Pharmacist

## 2024-01-11 ENCOUNTER — Other Ambulatory Visit: Payer: Self-pay | Admitting: Oncology

## 2024-01-11 ENCOUNTER — Ambulatory Visit: Admitting: Oncology

## 2024-01-11 ENCOUNTER — Inpatient Hospital Stay (HOSPITAL_BASED_OUTPATIENT_CLINIC_OR_DEPARTMENT_OTHER): Admitting: Oncology

## 2024-01-11 ENCOUNTER — Encounter: Payer: Self-pay | Admitting: Oncology

## 2024-01-11 VITALS — BP 105/81 | HR 77 | Temp 98.4°F | Resp 16

## 2024-01-11 DIAGNOSIS — C9000 Multiple myeloma not having achieved remission: Secondary | ICD-10-CM

## 2024-01-11 DIAGNOSIS — Z5112 Encounter for antineoplastic immunotherapy: Secondary | ICD-10-CM | POA: Diagnosis not present

## 2024-01-11 LAB — CBC WITH DIFFERENTIAL (CANCER CENTER ONLY)
Abs Immature Granulocytes: 0.31 K/uL — ABNORMAL HIGH (ref 0.00–0.07)
Basophils Absolute: 0 K/uL (ref 0.0–0.1)
Basophils Relative: 1 %
Eosinophils Absolute: 0.3 K/uL (ref 0.0–0.5)
Eosinophils Relative: 5 %
HCT: 39.6 % (ref 39.0–52.0)
Hemoglobin: 13.1 g/dL (ref 13.0–17.0)
Immature Granulocytes: 5 %
Lymphocytes Relative: 9 %
Lymphs Abs: 0.6 K/uL — ABNORMAL LOW (ref 0.7–4.0)
MCH: 29.4 pg (ref 26.0–34.0)
MCHC: 33.1 g/dL (ref 30.0–36.0)
MCV: 89 fL (ref 80.0–100.0)
Monocytes Absolute: 0.3 K/uL (ref 0.1–1.0)
Monocytes Relative: 5 %
Neutro Abs: 4.9 K/uL (ref 1.7–7.7)
Neutrophils Relative %: 75 %
Platelet Count: 122 K/uL — ABNORMAL LOW (ref 150–400)
RBC: 4.45 MIL/uL (ref 4.22–5.81)
RDW: 16.8 % — ABNORMAL HIGH (ref 11.5–15.5)
Smear Review: NORMAL
WBC Count: 6.5 K/uL (ref 4.0–10.5)
nRBC: 0 % (ref 0.0–0.2)

## 2024-01-11 MED ORDER — LEVOFLOXACIN 750 MG PO TABS: 750.0000 mg | ORAL_TABLET | Freq: Every day | ORAL | 0 refills | Status: DC

## 2024-01-11 MED ORDER — CLINDAMYCIN HCL 300 MG PO CAPS: 300.0000 mg | ORAL_CAPSULE | Freq: Three times a day (TID) | ORAL | 0 refills | Status: DC

## 2024-01-11 NOTE — Progress Notes (Signed)
 PATIENT IS WEARING MASK UNDER CHIN IN CLINIC TODAY.  HE HAS A LARGE, OOZING, WOUND ON LOWER LEFT JAW.

## 2024-01-11 NOTE — Telephone Encounter (Signed)
 Pt called states that Dr Ezzard saw him today and told him he needed to see local oral surgeon to lance & drain the area on his left jaw. There is some pus when I wipe it. He gave me prescription for Levaquin , but I'm not taking it. The side effects will kill you. Have you read them? Will you see if he will call me in clindamycin  300mg . That's what I used before. Message given to Dr Ezzard.

## 2024-01-12 ENCOUNTER — Encounter: Payer: Self-pay | Admitting: Oncology

## 2024-01-12 ENCOUNTER — Other Ambulatory Visit: Payer: Self-pay

## 2024-01-14 ENCOUNTER — Other Ambulatory Visit (HOSPITAL_BASED_OUTPATIENT_CLINIC_OR_DEPARTMENT_OTHER): Payer: Self-pay | Admitting: Surgery

## 2024-01-14 DIAGNOSIS — L0211 Cutaneous abscess of neck: Secondary | ICD-10-CM

## 2024-01-15 ENCOUNTER — Encounter: Payer: Self-pay | Admitting: Oncology

## 2024-01-19 ENCOUNTER — Inpatient Hospital Stay: Payer: Self-pay | Admitting: Oncology

## 2024-01-19 ENCOUNTER — Inpatient Hospital Stay: Payer: Self-pay

## 2024-01-21 ENCOUNTER — Ambulatory Visit (HOSPITAL_BASED_OUTPATIENT_CLINIC_OR_DEPARTMENT_OTHER)
Admission: RE | Admit: 2024-01-21 | Discharge: 2024-01-21 | Disposition: A | Discharge status: Home / Self Care | Source: Ambulatory Visit | Attending: Surgery | Admitting: Surgery

## 2024-01-21 DIAGNOSIS — L0211 Cutaneous abscess of neck: Secondary | ICD-10-CM

## 2024-01-21 MED ORDER — IOHEXOL 300 MG/ML  SOLN
75.0000 mL | Freq: Once | INTRAMUSCULAR | Status: AC | PRN
  Administered 2024-01-21: 75 mL via INTRAVENOUS

## 2024-01-25 ENCOUNTER — Ambulatory Visit: Admitting: Hematology and Oncology

## 2024-01-25 ENCOUNTER — Ambulatory Visit

## 2024-01-25 ENCOUNTER — Other Ambulatory Visit

## 2024-01-28 ENCOUNTER — Telehealth: Payer: Self-pay

## 2024-01-28 ENCOUNTER — Other Ambulatory Visit: Payer: Self-pay | Admitting: Oncology

## 2024-01-28 NOTE — Telephone Encounter (Signed)
 Per Melissa:  [name] needs to reach out to Dr. Adrianna office for medication prescription.  I called Dr. Adrianna office, spoke with Darice, she knows patient and told me she would reach out to him.  I attempted to call [name] to let him know that Dr .Adrianna office should get in touch with him, however he did not answer his phone.

## 2024-02-01 ENCOUNTER — Inpatient Hospital Stay: Attending: Oncology

## 2024-02-01 DIAGNOSIS — C9000 Multiple myeloma not having achieved remission: Secondary | ICD-10-CM | POA: Insufficient documentation

## 2024-02-01 LAB — CBC WITH DIFFERENTIAL (CANCER CENTER ONLY)
Abs Immature Granulocytes: 0.07 K/uL (ref 0.00–0.07)
Basophils Absolute: 0.1 K/uL (ref 0.0–0.1)
Basophils Relative: 1 %
Eosinophils Absolute: 0.3 K/uL (ref 0.0–0.5)
Eosinophils Relative: 4 %
HCT: 35.3 % — ABNORMAL LOW (ref 39.0–52.0)
Hemoglobin: 11.5 g/dL — ABNORMAL LOW (ref 13.0–17.0)
Immature Granulocytes: 1 %
Lymphocytes Relative: 35 %
Lymphs Abs: 2.6 K/uL (ref 0.7–4.0)
MCH: 29.9 pg (ref 26.0–34.0)
MCHC: 32.6 g/dL (ref 30.0–36.0)
MCV: 91.9 fL (ref 80.0–100.0)
Monocytes Absolute: 0.6 K/uL (ref 0.1–1.0)
Monocytes Relative: 8 %
Neutro Abs: 3.9 K/uL (ref 1.7–7.7)
Neutrophils Relative %: 51 %
Platelet Count: 232 K/uL (ref 150–400)
RBC: 3.84 MIL/uL — ABNORMAL LOW (ref 4.22–5.81)
RDW: 17.7 % — ABNORMAL HIGH (ref 11.5–15.5)
WBC Count: 7.5 K/uL (ref 4.0–10.5)
nRBC: 0 % (ref 0.0–0.2)

## 2024-02-01 LAB — CMP (CANCER CENTER ONLY)
ALT: 12 U/L (ref 0–44)
AST: 24 U/L (ref 15–41)
Albumin: 3.4 g/dL — ABNORMAL LOW (ref 3.5–5.0)
Alkaline Phosphatase: 73 U/L (ref 38–126)
Anion gap: 11 (ref 5–15)
BUN: 6 mg/dL — ABNORMAL LOW (ref 8–23)
CO2: 25 mmol/L (ref 22–32)
Calcium: 9.1 mg/dL (ref 8.9–10.3)
Chloride: 102 mmol/L (ref 98–111)
Creatinine: 0.92 mg/dL (ref 0.61–1.24)
GFR, Estimated: >60 mL/min (ref >60)
Glucose, Bld: 124 mg/dL — ABNORMAL HIGH (ref 70–99)
Potassium: 3 mmol/L — ABNORMAL LOW (ref 3.5–5.1)
Sodium: 138 mmol/L (ref 135–145)
Total Bilirubin: 0.2 mg/dL (ref 0.0–1.2)
Total Protein: 6.2 g/dL — ABNORMAL LOW (ref 6.5–8.1)

## 2024-02-02 LAB — KAPPA/LAMBDA LIGHT CHAINS
Kappa free light chain: 17.8 mg/L (ref 3.3–19.4)
Kappa, lambda light chain ratio: 0.51 (ref 0.26–1.65)
Lambda free light chains: 34.8 mg/L — ABNORMAL HIGH (ref 5.7–26.3)

## 2024-02-03 ENCOUNTER — Other Ambulatory Visit: Payer: Self-pay

## 2024-02-03 DIAGNOSIS — M7989 Other specified soft tissue disorders: Secondary | ICD-10-CM

## 2024-02-03 DIAGNOSIS — I82401 Acute embolism and thrombosis of unspecified deep veins of right lower extremity: Secondary | ICD-10-CM

## 2024-02-03 MED ORDER — APIXABAN 5 MG PO TABS: 5.0000 mg | ORAL_TABLET | Freq: Two times a day (BID) | ORAL | 0 refills | Status: DC

## 2024-02-05 LAB — MULTIPLE MYELOMA PANEL, SERUM
Albumin SerPl Elph-Mcnc: 3.1 g/dL (ref 2.9–4.4)
Albumin/Glob SerPl: 1.3 (ref 0.7–1.7)
Alpha 1: 0.3 g/dL (ref 0.0–0.4)
Alpha2 Glob SerPl Elph-Mcnc: 0.8 g/dL (ref 0.4–1.0)
B-Globulin SerPl Elph-Mcnc: 1 g/dL (ref 0.7–1.3)
Gamma Glob SerPl Elph-Mcnc: 0.4 g/dL (ref 0.4–1.8)
Globulin, Total: 2.5 g/dL (ref 2.2–3.9)
IgA: 208 mg/dL (ref 61–437)
IgG (Immunoglobin G), Serum: 549 mg/dL — ABNORMAL LOW (ref 603–1613)
IgM (Immunoglobulin M), Srm: 19 mg/dL — ABNORMAL LOW (ref 20–172)
M Protein SerPl Elph-Mcnc: 0.1 g/dL — ABNORMAL HIGH
Total Protein ELP: 5.6 g/dL — ABNORMAL LOW (ref 6.0–8.5)

## 2024-02-07 NOTE — Progress Notes (Unsigned)
 [facility] [address] [place],  [place]  [address] [phone]  Clinic Day:  02/08/2024  Referring physician: Dottie Norleen PHEBE PONCE, MD  HISTORY OF PRESENT ILLNESS:  The patient is a 67 y.o. 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  He comes in today to be evaluated before he has been to his next cycle of daratumumab /Revlimid /Decadron  (drD).  Of note, this regimen was held over the past 4 weeks as he had an abscess develop in his lower left jaw.  An incision had to be made to drain the infection in question.  He also required 2 courses of antibiotics to eradicate any residual infection left.  The patient is pleased that he no longer notices any purulent discharge.  He does feel better today to where I he is willing to restart his dRD regimen.    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  A bone marrow done in June 2022 showed 14-15% plasma cells.  Cytogenetics were normal, but the myeloma FISH panel could not be done due to insufficient quantity.  The patient received 2 cycles of RVD therapy before stopping therapy for numerous months.  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  Due to jaw issues and other problems the patient claims was from from Revlimid , he was switched to Ixazomib in March 2023.  However, due to poor tolerance, he stopped this medicine on his own in September 2023.  PHYSICAL EXAM:  There were no vitals taken for this visit. Wt Readings from Last 3 Encounters:  11/02/23 181 lb (82.1 kg)  10/09/23 180 lb (81.6 kg)  01/28/22 229 lb 9.6 oz (104.1 kg)   There is no height or weight on file to calculate BMI.  Performance status (ECOG): 1 - Symptomatic but completely ambulatory  Physical  Exam Vitals and nursing note reviewed.  Constitutional:      General: He is not in acute distress.    Appearance: Normal appearance. He is normal weight.     Comments: He is in a wheelchair, but physically looks better versus previous visits.  HENT:     Head: Normocephalic and atraumatic.     Jaw: Tenderness present.     Comments: A scab is now present where his previous skin infection was located    Mouth/Throat:     Mouth: Mucous membranes are moist.     Pharynx: Oropharynx is clear. No oropharyngeal exudate or posterior oropharyngeal erythema.     Comments: He has poor dentition, but no evidence of an intraoral infection Eyes:     General: No scleral icterus.    Extraocular Movements: Extraocular movements intact.     Conjunctiva/sclera: Conjunctivae normal.     Pupils: Pupils are equal, round, and reactive to light.  Cardiovascular:     Rate and Rhythm: Normal rate and regular rhythm.     Heart sounds: Normal heart sounds. No murmur heard.    No friction rub. No gallop.  Pulmonary:     Effort: Pulmonary effort is normal.     Breath sounds: Normal breath sounds. No wheezing, rhonchi or rales.  Abdominal:     General: Bowel sounds are normal. There is no distension.     Palpations: Abdomen is soft. There  is no hepatomegaly, splenomegaly or mass.     Tenderness: There is no abdominal tenderness.  Musculoskeletal:        General: Normal range of motion.     Cervical back: Normal range of motion and neck supple. No tenderness.     Thoracic back: No tenderness.     Right lower leg: No edema.     Left lower leg: No edema.  Lymphadenopathy:     Cervical: No cervical adenopathy.     Upper Body:     Right upper body: No supraclavicular or axillary adenopathy.     Left upper body: No supraclavicular or axillary adenopathy.     Lower Body: No right inguinal adenopathy. No left inguinal adenopathy.  Skin:    General: Skin is warm and dry.     Coloration: Skin is not jaundiced.      Findings: No rash.  Neurological:     Mental Status: He is alert and oriented to person, place, and time.     Cranial Nerves: No cranial nerve deficit.  Psychiatric:        Mood and Affect: Mood normal.        Behavior: Behavior normal.        Thought Content: Thought content normal.    LABS:      Latest Ref Rng & Units 02/01/2024   10:04 AM 01/11/2024   10:06 AM 12/28/2023   10:15 AM  CBC  WBC 4.0 - 10.5 K/uL 7.5  6.5  10.5   Hemoglobin 13.0 - 17.0 g/dL 88.4  86.8  86.9   Hematocrit 39.0 - 52.0 % 35.3  39.6  39.8   Platelets 150 - 400 K/uL 232  122  169       Latest Ref Rng & Units 02/01/2024   10:04 AM 12/21/2023   10:26 AM 10/09/2023   11:04 AM  CMP  Glucose 70 - 99 mg/dL 875  808  899   BUN 8 - 23 mg/dL 6  17  15    Creatinine 0.61 - 1.24 mg/dL 9.07  8.69  9.07   Sodium 135 - 145 mmol/L 138  134  137   Potassium 3.5 - 5.1 mmol/L 3.0  3.7  4.0   Chloride 98 - 111 mmol/L 102  100  100   CO2 22 - 32 mmol/L 25  22  25    Calcium  8.9 - 10.3 mg/dL 9.1  8.7  89.6   Total Protein 6.5 - 8.1 g/dL 6.2  5.7  7.1   Total Bilirubin 0.0 - 1.2 mg/dL 0.2  0.2  0.3   Alkaline Phos 38 - 126 U/L 73  76  109   AST 15 - 41 U/L 24  25  47   ALT 0 - 44 U/L 12  26  13      Latest Reference Range & Units 12/21/23 10:27 02/01/24 10:04  M Protein SerPl Elph-Mcnc Not Observed g/dL 0.1 (H) (C) 0.1 (H) (C)  (H): Data is abnormally high (C): Corrected   Latest Reference Range & Units 12/21/23 10:26 02/01/24 10:04  Lambda free light chains 5.7 - 26.3 mg/L 32.7 (H) 34.8 (H)  (H): Data is abnormally high ASSESSMENT & PLAN:  Assessment/Plan:  A 68 y.o. 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  When evaluating his recent myeloma parameters, despite him not being on his dRD regimen for the past 4 weeks, his levels show that his disease remains stable.  Understandably, the patient was pleased with  his most recent myeloma results.  He is willing to proceed with his next cycle of dRD.   However, it will be held until Monday, September 29th.  I want him to have his Revlimid  prescription in hand before he starts his next cycle regimen next week.  This would theoretically mark his fourth cycle of dRD.  Overall, the patient appears to be doing fairly well.  I will see him back in late October 2025 before he heads into to his fifth cycle of dRD.   The patient understands all the plans discussed today and is in agreement with them.  Troyce Febo DELENA Kerns, MD

## 2024-02-08 ENCOUNTER — Inpatient Hospital Stay (HOSPITAL_BASED_OUTPATIENT_CLINIC_OR_DEPARTMENT_OTHER): Admitting: Oncology

## 2024-02-08 ENCOUNTER — Other Ambulatory Visit: Payer: Self-pay | Admitting: Oncology

## 2024-02-08 ENCOUNTER — Telehealth: Payer: Self-pay | Admitting: Oncology

## 2024-02-08 ENCOUNTER — Encounter: Payer: Self-pay | Admitting: Oncology

## 2024-02-08 ENCOUNTER — Inpatient Hospital Stay

## 2024-02-08 DIAGNOSIS — C9000 Multiple myeloma not having achieved remission: Secondary | ICD-10-CM

## 2024-02-08 MED ORDER — LENALIDOMIDE 10 MG PO CAPS: 10.0000 mg | ORAL_CAPSULE | Freq: Every day | ORAL | 0 refills | Status: DC

## 2024-02-08 NOTE — Telephone Encounter (Signed)
 Patient has been scheduled for follow-up visit per 02/08/24 LOS.  Pt given an appt calendar with date and time.

## 2024-02-09 ENCOUNTER — Other Ambulatory Visit: Payer: Self-pay

## 2024-02-11 ENCOUNTER — Telehealth: Payer: Self-pay

## 2024-02-11 NOTE — Telephone Encounter (Signed)
 Pt called to make us  aware he saw Dr Allana as his chin incision is draining. Dr Allana placed him on different antibiotic and told him to contact [facility]. He received new shipment of Revlimid  today, but understands not to take it. Notified Dr Ezzard and Kaitlyn Schomberg,RPH.    Dr Ezzard notified of above @ 1650, no new orders.

## 2024-02-11 NOTE — Telephone Encounter (Signed)
 Call from patient. He states that he has had recurrence of draining from the surgical incision from his previous mandibular surgery. We will plan a course of oral antibiotics and referral to otorhinolaryngology.

## 2024-02-12 ENCOUNTER — Encounter: Payer: Self-pay | Admitting: Oncology

## 2024-02-15 ENCOUNTER — Inpatient Hospital Stay

## 2024-02-19 ENCOUNTER — Other Ambulatory Visit: Payer: Self-pay

## 2024-02-19 DIAGNOSIS — G8929 Other chronic pain: Secondary | ICD-10-CM

## 2024-02-19 DIAGNOSIS — M545 Low back pain, unspecified: Secondary | ICD-10-CM

## 2024-02-19 MED ORDER — HYDROCODONE-ACETAMINOPHEN 5-325 MG PO TABS: 1.0000 | ORAL_TABLET | ORAL | 0 refills | Status: DC | PRN

## 2024-02-22 ENCOUNTER — Other Ambulatory Visit

## 2024-02-22 ENCOUNTER — Ambulatory Visit: Admitting: Oncology

## 2024-02-22 ENCOUNTER — Ambulatory Visit

## 2024-02-29 ENCOUNTER — Inpatient Hospital Stay

## 2024-02-29 ENCOUNTER — Other Ambulatory Visit

## 2024-02-29 ENCOUNTER — Ambulatory Visit: Admitting: Oncology

## 2024-02-29 ENCOUNTER — Other Ambulatory Visit: Payer: Self-pay | Admitting: Hematology and Oncology

## 2024-02-29 ENCOUNTER — Ambulatory Visit

## 2024-02-29 ENCOUNTER — Inpatient Hospital Stay: Attending: Oncology

## 2024-02-29 DIAGNOSIS — M87180 Osteonecrosis due to drugs, jaw: Secondary | ICD-10-CM | POA: Diagnosis not present

## 2024-02-29 DIAGNOSIS — C9000 Multiple myeloma not having achieved remission: Secondary | ICD-10-CM | POA: Insufficient documentation

## 2024-02-29 DIAGNOSIS — Z5112 Encounter for antineoplastic immunotherapy: Secondary | ICD-10-CM | POA: Diagnosis present

## 2024-02-29 LAB — CBC WITH DIFFERENTIAL (CANCER CENTER ONLY)
Abs Immature Granulocytes: 0.02 K/uL (ref 0.00–0.07)
Basophils Absolute: 0.1 K/uL (ref 0.0–0.1)
Basophils Relative: 1 %
Eosinophils Absolute: 0.1 K/uL (ref 0.0–0.5)
Eosinophils Relative: 1 %
HCT: 39.5 % (ref 39.0–52.0)
Hemoglobin: 12.5 g/dL — ABNORMAL LOW (ref 13.0–17.0)
Immature Granulocytes: 0 %
Lymphocytes Relative: 9 %
Lymphs Abs: 0.7 K/uL (ref 0.7–4.0)
MCH: 30 pg (ref 26.0–34.0)
MCHC: 31.6 g/dL (ref 30.0–36.0)
MCV: 95 fL (ref 80.0–100.0)
Monocytes Absolute: 0.5 K/uL (ref 0.1–1.0)
Monocytes Relative: 6 %
Neutro Abs: 6.7 K/uL (ref 1.7–7.7)
Neutrophils Relative %: 83 %
Platelet Count: 191 K/uL (ref 150–400)
RBC: 4.16 MIL/uL — ABNORMAL LOW (ref 4.22–5.81)
RDW: 17.4 % — ABNORMAL HIGH (ref 11.5–15.5)
WBC Count: 8.1 K/uL (ref 4.0–10.5)
nRBC: 0 % (ref 0.0–0.2)

## 2024-02-29 MED ORDER — CLINDAMYCIN HCL 300 MG PO CAPS: 300.0000 mg | ORAL_CAPSULE | Freq: Three times a day (TID) | ORAL | 0 refills | Status: DC

## 2024-02-29 NOTE — Patient Instructions (Signed)
 Melissa NP is sending in a prescription for antibiotic Call Dr Adrianna office today regarding jaw infection See Dr you are referred to ASAP at [facility] in [place] [place]

## 2024-02-29 NOTE — Progress Notes (Signed)
 Melissa NP saw pt in Chair 1 due to oozing wound on left jaw.  Pt states he had jaw surgery 2.5 years ago, and that he thinks it is infected and this infection started maybe yesterday.  Melissa notified, and sent in Rx for antibiotic for pt. Pt aware.  Per Dr ezzard we will hold treatment today.  Appts made for pt to return in one week for labs and follow up.

## 2024-03-04 ENCOUNTER — Other Ambulatory Visit: Payer: Self-pay | Admitting: Hematology and Oncology

## 2024-03-04 ENCOUNTER — Other Ambulatory Visit: Payer: Self-pay

## 2024-03-04 DIAGNOSIS — T148XXA Other injury of unspecified body region, initial encounter: Secondary | ICD-10-CM

## 2024-03-04 DIAGNOSIS — C9000 Multiple myeloma not having achieved remission: Secondary | ICD-10-CM

## 2024-03-04 NOTE — Telephone Encounter (Signed)
 Pt called to cancel appt's for 03/07/2024, as his tx is on hold. He also requested a prescription for 1 more week of Clindamycin . He has enough to last until Monday morning. Afebrile. Drainage has gone away. No redness, and no warmth to site. He does had scabbed area just next to area that had been draining. He just feels that 1 more week of antibiotics is needed.

## 2024-03-07 ENCOUNTER — Encounter: Payer: Self-pay | Admitting: Oncology

## 2024-03-07 ENCOUNTER — Inpatient Hospital Stay: Admitting: Hematology and Oncology

## 2024-03-07 ENCOUNTER — Inpatient Hospital Stay

## 2024-03-07 ENCOUNTER — Telehealth: Payer: Self-pay

## 2024-03-07 DIAGNOSIS — C9000 Multiple myeloma not having achieved remission: Secondary | ICD-10-CM

## 2024-03-07 NOTE — Telephone Encounter (Signed)
 Opened in error

## 2024-03-07 NOTE — Telephone Encounter (Signed)
 Pt came into clinic stating he decided to come on in today (after calling me Friday and cancelling today's appt) to see if we could go ahead and get his treatment started. I explained to him that we had cancelled today's appt per his request and so his appt is next Monday, 02/23/2024. No further draining @ old incision site per pt. He still has the scabbed area on lower left chin, that has been there for quite some time. He mentions he took his last antibiotic this morning. I told him that Melissa,NP, didn't feel comfortable renewing another week of antibiotics without him being evaluated by his dentist or oral surgeon. Pt states that he isn't going back to [facility]. Dr Allana told him he was going to send him to someone @ [facility], but he hasn't heard anything from them. Pt understands he will be off a full week of antibiotics and come back next Monday as scheduled to see Dr Ezzard and restart treatment.

## 2024-03-10 ENCOUNTER — Other Ambulatory Visit: Payer: Self-pay

## 2024-03-10 DIAGNOSIS — C9000 Multiple myeloma not having achieved remission: Secondary | ICD-10-CM

## 2024-03-10 MED ORDER — LENALIDOMIDE 10 MG PO CAPS
10.0000 mg | ORAL_CAPSULE | Freq: Every day | ORAL | 0 refills | Status: AC
Start: 2024-03-10 — End: ?

## 2024-03-13 NOTE — Progress Notes (Unsigned)
 [facility] at [facility] [address] [place],  [place]  [address] [phone]  Clinic Day:  02/08/2024  Referring physician: Ezzard Valaria LABOR, MD  HISTORY OF PRESENT ILLNESS:  The patient is a 68 y.o. 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  He comes in today to be evaluated before he heads into his next cycle of daratumumab /Revlimid /Decadron  (drD).  Of note, this regimen was held over the past 8 weeks as he had an abscess in his lower left jaw.  An incision had to be made to drain the infection in question.  He also needed multiple courses of antibiotics to eradicate any residual infection left.  The patient has not noticed any purulent discharge.  He does feel better today to where I he is willing to restart his dRD regimen.  His only other complaint today is right ear pain.    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  A bone marrow done in June 2022 showed 14-15% plasma cells.  Cytogenetics were normal, but the myeloma FISH panel could not be done due to insufficient quantity.  The patient received 2 cycles of RVD therapy before stopping therapy for numerous months.  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  Due to jaw issues and other problems the patient claims was from from Revlimid , he was switched to Ixazomib in March 2023.  However, due to poor tolerance, he stopped this medicine on his own in September 2023.  PHYSICAL EXAM:  There were no vitals taken for this visit. Wt Readings from Last 3 Encounters:  11/02/23 181 lb (82.1 kg)  10/09/23 180 lb (81.6 kg)  01/28/22 229 lb 9.6 oz (104.1 kg)   There is no height or weight on file to calculate BMI.  Performance status (ECOG): 1 - Symptomatic but completely  ambulatory  Physical Exam Vitals and nursing note reviewed.  Constitutional:      General: He is not in acute distress.    Appearance: Normal appearance. He is normal weight.     Comments: He is in a wheelchair, but physically looks better versus previous visits.  HENT:     Head: Normocephalic and atraumatic.     Jaw: No tenderness.     Comments: A scab is now present where his previous skin infection was located    Left Ear: There is impacted cerumen.     Ears:     Comments: Dried blood seen in the the distal aspects of his right ear canal    Mouth/Throat:     Mouth: Mucous membranes are moist.     Pharynx: Oropharynx is clear. No oropharyngeal exudate or posterior oropharyngeal erythema.     Comments: He has poor dentition, but no evidence of an intraoral infection Eyes:     General: No scleral icterus.    Extraocular Movements: Extraocular movements intact.     Conjunctiva/sclera: Conjunctivae normal.     Pupils: Pupils are equal, round, and reactive to light.  Cardiovascular:     Rate and Rhythm: Normal rate and regular rhythm.     Heart sounds: Normal heart sounds. No murmur heard.    No friction rub. No gallop.  Pulmonary:     Effort: Pulmonary effort is normal.  Breath sounds: Normal breath sounds. No wheezing, rhonchi or rales.  Abdominal:     General: Bowel sounds are normal. There is no distension.     Palpations: Abdomen is soft. There is no hepatomegaly, splenomegaly or mass.     Tenderness: There is no abdominal tenderness.  Musculoskeletal:        General: Normal range of motion.     Cervical back: Normal range of motion and neck supple. No tenderness.     Thoracic back: No tenderness.     Right lower leg: No edema.     Left lower leg: No edema.  Lymphadenopathy:     Cervical: No cervical adenopathy.     Upper Body:     Right upper body: No supraclavicular or axillary adenopathy.     Left upper body: No supraclavicular or axillary adenopathy.     Lower  Body: No right inguinal adenopathy. No left inguinal adenopathy.  Skin:    General: Skin is warm and dry.     Coloration: Skin is not jaundiced.     Findings: No rash.  Neurological:     Mental Status: He is alert and oriented to person, place, and time.     Cranial Nerves: No cranial nerve deficit.  Psychiatric:        Mood and Affect: Mood normal.        Behavior: Behavior normal.        Thought Content: Thought content normal.    LABS:      Latest Ref Rng & Units 02/29/2024   10:14 AM 02/01/2024   10:04 AM 01/11/2024   10:06 AM  CBC  WBC 4.0 - 10.5 K/uL 8.1  7.5  6.5   Hemoglobin 13.0 - 17.0 g/dL 87.4  88.4  86.8   Hematocrit 39.0 - 52.0 % 39.5  35.3  39.6   Platelets 150 - 400 K/uL 191  232  122       Latest Ref Rng & Units 02/01/2024   10:04 AM 12/21/2023   10:26 AM 10/09/2023   11:04 AM  CMP  Glucose 70 - 99 mg/dL 875  808  899   BUN 8 - 23 mg/dL 6  17  15    Creatinine 0.61 - 1.24 mg/dL 9.07  8.69  9.07   Sodium 135 - 145 mmol/L 138  134  137   Potassium 3.5 - 5.1 mmol/L 3.0  3.7  4.0   Chloride 98 - 111 mmol/L 102  100  100   CO2 22 - 32 mmol/L 25  22  25    Calcium  8.9 - 10.3 mg/dL 9.1  8.7  89.6   Total Protein 6.5 - 8.1 g/dL 6.2  5.7  7.1   Total Bilirubin 0.0 - 1.2 mg/dL 0.2  0.2  0.3   Alkaline Phos 38 - 126 U/L 73  76  109   AST 15 - 41 U/L 24  25  47   ALT 0 - 44 U/L 12  26  13      Latest Reference Range & Units 12/21/23 10:27 02/01/24 10:04  M Protein SerPl Elph-Mcnc Not Observed g/dL 0.1 (H) (C) 0.1 (H) (C)  (H): Data is abnormally high (C): Corrected   Latest Reference Range & Units 12/21/23 10:26 02/01/24 10:04  Lambda free light chains 5.7 - 26.3 mg/L 32.7 (H) 34.8 (H)  (H): Data is abnormally high  ASSESSMENT & PLAN:  Assessment/Plan:  A 68 y.o. 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  The patient had an approximate 8-week break from his regimen in order for his left jaw issue to completely heal.  As this appears to be the case  and he is mentally ready to proceed with additional therapy, he will proceed with his 4th cycle of dRD.  I will see this patient back in 4 weeks before he heads into his 5th cycle of dRD.  The patient understands all the plans discussed today and is in agreement with them.  Jarrod Bodkins DELENA Kerns, MD

## 2024-03-14 ENCOUNTER — Inpatient Hospital Stay

## 2024-03-14 ENCOUNTER — Inpatient Hospital Stay: Admitting: Oncology

## 2024-03-14 ENCOUNTER — Encounter: Payer: Self-pay | Admitting: Oncology

## 2024-03-14 ENCOUNTER — Other Ambulatory Visit: Payer: Self-pay | Admitting: Pharmacist

## 2024-03-14 VITALS — BP 152/85 | HR 63 | Temp 98.0°F | Resp 18

## 2024-03-14 VITALS — BP 130/81 | HR 94 | Temp 98.2°F | Resp 16

## 2024-03-14 DIAGNOSIS — C9000 Multiple myeloma not having achieved remission: Secondary | ICD-10-CM

## 2024-03-14 DIAGNOSIS — Z5112 Encounter for antineoplastic immunotherapy: Secondary | ICD-10-CM | POA: Diagnosis not present

## 2024-03-14 LAB — CBC WITH DIFFERENTIAL (CANCER CENTER ONLY)
Abs Immature Granulocytes: 0.02 K/uL (ref 0.00–0.07)
Basophils Absolute: 0.1 K/uL (ref 0.0–0.1)
Basophils Relative: 1 %
Eosinophils Absolute: 0.4 K/uL (ref 0.0–0.5)
Eosinophils Relative: 5 %
HCT: 36 % — ABNORMAL LOW (ref 39.0–52.0)
Hemoglobin: 11.7 g/dL — ABNORMAL LOW (ref 13.0–17.0)
Immature Granulocytes: 0 %
Lymphocytes Relative: 34 %
Lymphs Abs: 2.6 K/uL (ref 0.7–4.0)
MCH: 30.5 pg (ref 26.0–34.0)
MCHC: 32.5 g/dL (ref 30.0–36.0)
MCV: 94 fL (ref 80.0–100.0)
Monocytes Absolute: 0.7 K/uL (ref 0.1–1.0)
Monocytes Relative: 9 %
Neutro Abs: 4.1 K/uL (ref 1.7–7.7)
Neutrophils Relative %: 51 %
Platelet Count: 195 K/uL (ref 150–400)
RBC: 3.83 MIL/uL — ABNORMAL LOW (ref 4.22–5.81)
RDW: 16.8 % — ABNORMAL HIGH (ref 11.5–15.5)
WBC Count: 7.9 K/uL (ref 4.0–10.5)
nRBC: 0 % (ref 0.0–0.2)

## 2024-03-14 LAB — CMP (CANCER CENTER ONLY)
ALT: 21 U/L (ref 0–44)
AST: 38 U/L (ref 15–41)
Albumin: 3.9 g/dL (ref 3.5–5.0)
Alkaline Phosphatase: 58 U/L (ref 38–126)
Anion gap: 8 (ref 5–15)
BUN: 7 mg/dL — ABNORMAL LOW (ref 8–23)
CO2: 28 mmol/L (ref 22–32)
Calcium: 9.1 mg/dL (ref 8.9–10.3)
Chloride: 105 mmol/L (ref 98–111)
Creatinine: 1.03 mg/dL (ref 0.61–1.24)
GFR, Estimated: >60 mL/min (ref >60)
Glucose, Bld: 83 mg/dL (ref 70–99)
Potassium: 3.4 mmol/L — ABNORMAL LOW (ref 3.5–5.1)
Sodium: 141 mmol/L (ref 135–145)
Total Bilirubin: 0.4 mg/dL (ref 0.0–1.2)
Total Protein: 6 g/dL — ABNORMAL LOW (ref 6.5–8.1)

## 2024-03-14 MED ORDER — DEXAMETHASONE 4 MG PO TABS
4.0000 mg | ORAL_TABLET | Freq: Once | ORAL | Status: AC
  Administered 2024-03-14: 4 mg via ORAL
  Filled 2024-03-14: qty 1

## 2024-03-14 MED ORDER — DARATUMUMAB-HYALURONIDASE-FIHJ 1800-30000 MG-UT/15ML ~~LOC~~ SOLN
1800.0000 mg | Freq: Once | SUBCUTANEOUS | Status: AC
  Administered 2024-03-14: 1800 mg via SUBCUTANEOUS
  Filled 2024-03-14: qty 15

## 2024-03-14 MED ORDER — ACETAMINOPHEN 325 MG PO TABS
650.0000 mg | ORAL_TABLET | Freq: Once | ORAL | Status: AC
  Administered 2024-03-14: 650 mg via ORAL
  Filled 2024-03-14: qty 2

## 2024-03-14 MED ORDER — ACYCLOVIR 400 MG PO TABS: 400.0000 mg | ORAL_TABLET | Freq: Every day | ORAL | 3 refills | Status: AC

## 2024-03-14 MED ORDER — DIPHENHYDRAMINE HCL 25 MG PO CAPS
50.0000 mg | ORAL_CAPSULE | Freq: Once | ORAL | Status: AC
  Administered 2024-03-14: 50 mg via ORAL
  Filled 2024-03-14: qty 2

## 2024-03-14 NOTE — Patient Instructions (Signed)
 Daratumumab ; Hyaluronidase  Injection What is this medication? DARATUMUMAB ; HYALURONIDASE  (dar a toom ue mab; hye al ur ON i dase) treats multiple myeloma, a type of bone marrow cancer. Daratumumab  works by blocking a protein that causes cancer cells to grow and multiply. This helps to slow or stop the spread of cancer cells. Hyaluronidase  works by increasing the absorption of other medications in the body to help them work better. This medication may also be used treat amyloidosis, a condition that causes the buildup of a protein (amyloid) in your body. It works by reducing the buildup of this protein, which decreases symptoms. It is a combination medication that contains a monoclonal antibody. This medicine may be used for other purposes; ask your health care provider or pharmacist if you have questions. COMMON BRAND NAME(S): DARZALEX  FASPRO What should I tell my care team before I take this medication? They need to know if you have any of these conditions: Heart disease Infection, such as chickenpox, cold sores, herpes, hepatitis B Lung or breathing disease An unusual or allergic reaction to daratumumab , hyaluronidase , other medications, foods, dyes, or preservatives Pregnant or trying to get pregnant Breast-feeding How should I use this medication? This medication is injected under the skin. It is given by your care team in a hospital or clinic setting. Talk to your care team about the use of this medication in children. Special care may be needed. Overdosage: If you think you have taken too much of this medicine contact a poison control center or emergency room at once. NOTE: This medicine is only for you. Do not share this medicine with others. What if I miss a dose? Keep appointments for follow-up doses. It is important not to miss your dose. Call your care team if you are unable to keep an appointment. What may interact with this medication? Interactions have not been studied. This list  may not describe all possible interactions. Give your health care provider a list of all the medicines, herbs, non-prescription drugs, or dietary supplements you use. Also tell them if you smoke, drink alcohol, or use illegal drugs. Some items may interact with your medicine. What should I watch for while using this medication? Your condition will be monitored carefully while you are receiving this medication. This medication can cause serious allergic reactions. To reduce your risk, your care team may give you other medication to take before receiving this one. Be sure to follow the directions from your care team. This medication can affect the results of blood tests to match your blood type. These changes can last for up to 6 months after the final dose. Your care team will do blood tests to match your blood type before you start treatment. Tell all of your care team that you are being treated with this medication before receiving a blood transfusion. This medication can affect the results of some tests used to determine treatment response; extra tests may be needed to evaluate response. Talk to your care team if you wish to become pregnant or think you are pregnant. This medication can cause serious birth defects if taken during pregnancy and for 3 months after the last dose. A reliable form of contraception is recommended while taking this medication and for 3 months after the last dose. Talk to your care team about effective forms of contraception. Do not breast-feed while taking this medication. What side effects may I notice from receiving this medication? Side effects that you should report to your care team as soon as  possible: Allergic reactions--skin rash, itching, hives, swelling of the face, lips, tongue, or throat Heart rhythm changes--fast or irregular heartbeat, dizziness, feeling faint or lightheaded, chest pain, trouble breathing Infection--fever, chills, cough, sore throat, wounds that  don't heal, pain or trouble when passing urine, general feeling of discomfort or being unwell Infusion reactions--chest pain, shortness of breath or trouble breathing, feeling faint or lightheaded Sudden eye pain or change in vision such as blurry vision, seeing halos around lights, vision loss Unusual bruising or bleeding Side effects that usually do not require medical attention (report to your care team if they continue or are bothersome): Constipation Diarrhea Fatigue Nausea Pain, tingling, or numbness in the hands or feet Swelling of the ankles, hands, or feet This list may not describe all possible side effects. Call your doctor for medical advice about side effects. You may report side effects to FDA at 1-800-FDA-1088. Where should I keep my medication? This medication is given in a hospital or clinic. It will not be stored at home. NOTE: This sheet is a summary. It may not cover all possible information. If you have questions about this medicine, talk to your doctor, pharmacist, or health care provider.  2024 Elsevier/Gold Standard (2021-09-10 00:00:00)

## 2024-03-15 ENCOUNTER — Telehealth: Payer: Self-pay

## 2024-03-15 ENCOUNTER — Other Ambulatory Visit: Payer: Self-pay

## 2024-03-15 LAB — KAPPA/LAMBDA LIGHT CHAINS
Kappa free light chain: 15.4 mg/L (ref 3.3–19.4)
Kappa, lambda light chain ratio: 0.49 (ref 0.26–1.65)
Lambda free light chains: 31.2 mg/L — ABNORMAL HIGH (ref 5.7–26.3)

## 2024-03-15 NOTE — Telephone Encounter (Signed)
 Dr Ezzard: Andrew Espinoza, please verify that this pt has this Revlimid  for this cycle of treatment. thx   Pt states he does have the Revlimid . He also wants you to know he got his CT results from left torso done @ [facility]. He said he has kidney stone obstructing ureter. He has called PCP and they are referring him to doctor in [place] he said.  03/11/24 CT abd/pelvis Findings: Large stone in the mid left ureter unchanged with associated hydronephrosis. Left-sided nephrolithiasis.

## 2024-03-16 LAB — PROTEIN ELECTROPHORESIS, SERUM
A/G Ratio: 1.5 (ref 0.7–1.7)
Albumin ELP: 3.3 g/dL (ref 2.9–4.4)
Alpha-1-Globulin: 0.3 g/dL (ref 0.0–0.4)
Alpha-2-Globulin: 0.7 g/dL (ref 0.4–1.0)
Beta Globulin: 0.9 g/dL (ref 0.7–1.3)
Gamma Globulin: 0.4 g/dL (ref 0.4–1.8)
Globulin, Total: 2.2 g/dL (ref 2.2–3.9)
M-Spike, %: 0.1 g/dL — ABNORMAL HIGH
Total Protein ELP: 5.5 g/dL — ABNORMAL LOW (ref 6.0–8.5)

## 2024-03-21 ENCOUNTER — Other Ambulatory Visit: Payer: Self-pay | Admitting: Oncology

## 2024-03-21 ENCOUNTER — Inpatient Hospital Stay

## 2024-03-21 ENCOUNTER — Telehealth: Payer: Self-pay

## 2024-03-21 DIAGNOSIS — C9000 Multiple myeloma not having achieved remission: Secondary | ICD-10-CM

## 2024-03-21 MED ORDER — CLINDAMYCIN HCL 300 MG PO CAPS: 600.0000 mg | ORAL_CAPSULE | Freq: Three times a day (TID) | ORAL | 1 refills | Status: AC

## 2024-03-21 NOTE — Telephone Encounter (Signed)
 Pt LVM on nurse line (which I transferred to Dr Ezzard' CMA,Tammy's phone, so Dr Ezzard could get a chance to hear the VM as well) stating he took himself off the Revlimid  because his jaw started leaking bad again. Pt voiced frustration that he was sick and tired f this happening. He states, this is a cancer problem with my jaw. I want him to send me in some antibiotic, and not for just 5 or 7 days. It needs to be long enough to clear this up. He can send to [facility] pharmacy.   Dr Ezzard was able to listen to pt's voicemail over lunch and sent in clindamycin  for 2 weeks. He also asked Tammy,CMA, to send referral back to the surgeon he took care of his jaw initially in [place].

## 2024-03-22 ENCOUNTER — Encounter: Payer: Self-pay | Admitting: Oncology

## 2024-03-25 ENCOUNTER — Encounter: Payer: Self-pay | Admitting: Oncology

## 2024-03-25 ENCOUNTER — Telehealth: Payer: Self-pay

## 2024-03-25 NOTE — Telephone Encounter (Signed)
 Pt LVM on nurse line that he went to [facility] yesterday. They gave him antibiotics, and told him he would require major surgery. He wants to cx appt for this Monday. States he will call back once the jaw issue is taken care of.  After review of chart- pt saw Dr Oliva Rily - [facility] -dx w/osteomyelitis - placed on clindamycin  for 21d, ordered CT soft tissue neck. Message sent to Dr Ezzard, Devere Afton LIED, Bartley Merino, infusion nurse, and [facility].

## 2024-03-28 ENCOUNTER — Inpatient Hospital Stay

## 2024-03-28 ENCOUNTER — Telehealth: Payer: Self-pay | Admitting: Oncology

## 2024-03-28 ENCOUNTER — Other Ambulatory Visit: Payer: Self-pay

## 2024-03-28 DIAGNOSIS — M545 Low back pain, unspecified: Secondary | ICD-10-CM

## 2024-03-28 DIAGNOSIS — G8929 Other chronic pain: Secondary | ICD-10-CM

## 2024-03-28 MED ORDER — HYDROCODONE-ACETAMINOPHEN 5-325 MG PO TABS: 1.0000 | ORAL_TABLET | ORAL | 0 refills | Status: DC | PRN

## 2024-03-28 NOTE — Telephone Encounter (Signed)
 03/28/24 Patient called to cancel appts on 03/28/24.Appts cancelled per Nursing note on 03/25/24.Spoke with patient and informed him next appt is on 04/04/24 for bloodwork only and will see Dr Ezzard on 04/11/24.Patient stated he was not sure if he would be coming in.

## 2024-03-30 ENCOUNTER — Other Ambulatory Visit: Payer: Self-pay

## 2024-04-01 ENCOUNTER — Telehealth: Payer: Self-pay | Admitting: Oncology

## 2024-04-01 NOTE — Telephone Encounter (Signed)
 04/01/24 Patient called requesting an appt to go over upcoming surgeries.

## 2024-04-01 NOTE — Telephone Encounter (Signed)
 04/01/24 Patient called and cancelled all appts until after he has surgery.

## 2024-04-04 ENCOUNTER — Inpatient Hospital Stay

## 2024-04-04 ENCOUNTER — Inpatient Hospital Stay: Attending: Oncology | Admitting: Hematology and Oncology

## 2024-04-04 ENCOUNTER — Other Ambulatory Visit: Payer: Self-pay

## 2024-04-04 ENCOUNTER — Other Ambulatory Visit: Payer: Self-pay | Admitting: Hematology and Oncology

## 2024-04-04 VITALS — BP 138/84 | HR 62 | Temp 97.7°F | Resp 16

## 2024-04-04 DIAGNOSIS — C9000 Multiple myeloma not having achieved remission: Secondary | ICD-10-CM

## 2024-04-04 LAB — CBC WITH DIFFERENTIAL (CANCER CENTER ONLY)
Abs Immature Granulocytes: 0.01 K/uL (ref 0.00–0.07)
Basophils Absolute: 0.1 K/uL (ref 0.0–0.1)
Basophils Relative: 2 %
Eosinophils Absolute: 0.3 K/uL (ref 0.0–0.5)
Eosinophils Relative: 5 %
HCT: 37.4 % — ABNORMAL LOW (ref 39.0–52.0)
Hemoglobin: 12 g/dL — ABNORMAL LOW (ref 13.0–17.0)
Immature Granulocytes: 0 %
Lymphocytes Relative: 40 %
Lymphs Abs: 2.2 K/uL (ref 0.7–4.0)
MCH: 29.9 pg (ref 26.0–34.0)
MCHC: 32.1 g/dL (ref 30.0–36.0)
MCV: 93.3 fL (ref 80.0–100.0)
Monocytes Absolute: 0.4 K/uL (ref 0.1–1.0)
Monocytes Relative: 8 %
Neutro Abs: 2.5 K/uL (ref 1.7–7.7)
Neutrophils Relative %: 45 %
Platelet Count: 245 K/uL (ref 150–400)
RBC: 4.01 MIL/uL — ABNORMAL LOW (ref 4.22–5.81)
RDW: 15.5 % (ref 11.5–15.5)
WBC Count: 5.5 K/uL (ref 4.0–10.5)
nRBC: 0 % (ref 0.0–0.2)

## 2024-04-04 LAB — CMP (CANCER CENTER ONLY)
ALT: 13 U/L (ref 0–44)
AST: 35 U/L (ref 15–41)
Albumin: 3.7 g/dL (ref 3.5–5.0)
Alkaline Phosphatase: 64 U/L (ref 38–126)
Anion gap: 13 (ref 5–15)
BUN: 8 mg/dL (ref 8–23)
CO2: 28 mmol/L (ref 22–32)
Calcium: 9.3 mg/dL (ref 8.9–10.3)
Chloride: 101 mmol/L (ref 98–111)
Creatinine: 1.12 mg/dL (ref 0.61–1.24)
GFR, Estimated: >60 mL/min (ref >60)
Glucose, Bld: 115 mg/dL — ABNORMAL HIGH (ref 70–99)
Potassium: 2.8 mmol/L — ABNORMAL LOW (ref 3.5–5.1)
Sodium: 141 mmol/L (ref 135–145)
Total Bilirubin: 0.2 mg/dL (ref 0.0–1.2)
Total Protein: 6.3 g/dL — ABNORMAL LOW (ref 6.5–8.1)

## 2024-04-04 NOTE — Progress Notes (Unsigned)
 [facility] at [facility] [address] [place],  [place]  [address] [phone]  Clinic Day:  02/08/2024  Referring physician: Dottie Norleen PHEBE PONCE, MD  HISTORY OF PRESENT ILLNESS:  The patient is a 68 y.o. 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  He comes in today to be evaluated before he heads into his next cycle of daratumumab /Revlimid /Decadron  (drD).  Of note, this regimen was held over the past 8 weeks as he had an abscess in his lower left jaw.  An incision had to be made to drain the infection in question.  He also needed multiple courses of antibiotics to eradicate any residual infection left.  The patient has not noticed any purulent discharge.  He does feel better today to where I he is willing to restart his dRD regimen.  His only other complaint today is right ear pain.    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  A bone marrow done in June 2022 showed 14-15% plasma cells.  Cytogenetics were normal, but the myeloma FISH panel could not be done due to insufficient quantity.  The patient received 2 cycles of RVD therapy before stopping therapy for numerous months.  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  Due to jaw issues and other problems the patient claims was from from Revlimid , he was switched to Ixazomib in March 2023.  However, due to poor tolerance, he stopped this medicine on his own in September 2023.  PHYSICAL EXAM:  There were no vitals taken for this visit. Wt Readings from Last 3 Encounters:  11/02/23 181 lb (82.1 kg)  10/09/23 180 lb (81.6 kg)  01/28/22 229 lb 9.6 oz (104.1 kg)   There is no height or weight on file to calculate BMI.  Performance status (ECOG): 1 - Symptomatic but completely  ambulatory  Physical Exam Vitals and nursing note reviewed.  Constitutional:      General: He is not in acute distress.    Appearance: Normal appearance. He is normal weight.     Comments: He is in a wheelchair, but physically looks better versus previous visits.  HENT:     Head: Normocephalic and atraumatic.     Jaw: No tenderness.     Comments: A scab is now present where his previous skin infection was located    Left Ear: There is impacted cerumen.     Ears:     Comments: Dried blood seen in the the distal aspects of his right ear canal    Mouth/Throat:     Mouth: Mucous membranes are moist.     Pharynx: Oropharynx is clear. No oropharyngeal exudate or posterior oropharyngeal erythema.     Comments: He has poor dentition, but no evidence of an intraoral infection Eyes:     General: No scleral icterus.    Extraocular Movements: Extraocular movements intact.     Conjunctiva/sclera: Conjunctivae normal.     Pupils: Pupils are equal, round, and reactive to light.  Cardiovascular:     Rate and Rhythm: Normal rate and regular rhythm.     Heart sounds: Normal heart sounds. No murmur heard.    No friction rub. No gallop.  Pulmonary:     Effort: Pulmonary effort is normal.  Breath sounds: Normal breath sounds. No wheezing, rhonchi or rales.  Abdominal:     General: Bowel sounds are normal. There is no distension.     Palpations: Abdomen is soft. There is no hepatomegaly, splenomegaly or mass.     Tenderness: There is no abdominal tenderness.  Musculoskeletal:        General: Normal range of motion.     Cervical back: Normal range of motion and neck supple. No tenderness.     Thoracic back: No tenderness.     Right lower leg: No edema.     Left lower leg: No edema.  Lymphadenopathy:     Cervical: No cervical adenopathy.     Upper Body:     Right upper body: No supraclavicular or axillary adenopathy.     Left upper body: No supraclavicular or axillary adenopathy.     Lower  Body: No right inguinal adenopathy. No left inguinal adenopathy.  Skin:    General: Skin is warm and dry.     Coloration: Skin is not jaundiced.     Findings: No rash.  Neurological:     Mental Status: He is alert and oriented to person, place, and time.     Cranial Nerves: No cranial nerve deficit.  Psychiatric:        Mood and Affect: Mood normal.        Behavior: Behavior normal.        Thought Content: Thought content normal.    LABS:      Latest Ref Rng & Units 03/14/2024   10:02 AM 02/29/2024   10:14 AM 02/01/2024   10:04 AM  CBC  WBC 4.0 - 10.5 K/uL 7.9  8.1  7.5   Hemoglobin 13.0 - 17.0 g/dL 88.2  87.4  88.4   Hematocrit 39.0 - 52.0 % 36.0  39.5  35.3   Platelets 150 - 400 K/uL 195  191  232       Latest Ref Rng & Units 03/14/2024   10:02 AM 02/01/2024   10:04 AM 12/21/2023   10:26 AM  CMP  Glucose 70 - 99 mg/dL 83  875  808   BUN 8 - 23 mg/dL 7  6  17    Creatinine 0.61 - 1.24 mg/dL 8.96  9.07  8.69   Sodium 135 - 145 mmol/L 141  138  134   Potassium 3.5 - 5.1 mmol/L 3.4  3.0  3.7   Chloride 98 - 111 mmol/L 105  102  100   CO2 22 - 32 mmol/L 28  25  22    Calcium  8.9 - 10.3 mg/dL 9.1  9.1  8.7   Total Protein 6.5 - 8.1 g/dL 6.0  6.2  5.7   Total Bilirubin 0.0 - 1.2 mg/dL 0.4  0.2  0.2   Alkaline Phos 38 - 126 U/L 58  73  76   AST 15 - 41 U/L 38  24  25   ALT 0 - 44 U/L 21  12  26      Latest Reference Range & Units 12/21/23 10:27 02/01/24 10:04  M Protein SerPl Elph-Mcnc Not Observed g/dL 0.1 (H) (C) 0.1 (H) (C)  (H): Data is abnormally high (C): Corrected   Latest Reference Range & Units 12/21/23 10:26 02/01/24 10:04  Lambda free light chains 5.7 - 26.3 mg/L 32.7 (H) 34.8 (H)  (H): Data is abnormally high  ASSESSMENT & PLAN:  Assessment/Plan:  A 68 y.o. 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  The patient had an approximate 8-week break from his regimen in order for his left jaw issue to completely heal.  As this appears to be the case  and he is mentally ready to proceed with additional therapy, he will proceed with his 4th cycle of dRD.  I will see this patient back in 4 weeks before he heads into his 5th cycle of dRD.  The patient understands all the plans discussed today and is in agreement with them.  Eleanor DELENA Bach, NP

## 2024-04-05 ENCOUNTER — Encounter: Payer: Self-pay | Admitting: Oncology

## 2024-04-06 ENCOUNTER — Telehealth: Payer: Self-pay

## 2024-04-06 NOTE — ED Provider Notes (Signed)
 Patient placed in [facility] pathway, seen and evaluated for chief complaint of left sided jaw pain, pt states he currently follows with oncology for multiple myeloma. 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 Presents today due to concerns for worsening infection.Pertinent exam findings include non-toxic in appearance. Patient counseled on process, plan, and necessity for staying for completing the evaluation.   This document serves as a record of services personally performed by Charleen Olene BERNHARDT 2:10 PM  Emergency Department Provider Note   History   Chief Complaint  Patient presents with  . Wound Infection      History provided by:  Patient Neck Pain Pain location:  L side and R side Quality:  Aching Pain radiates to:  Does not radiate Pain severity:  Moderate Onset quality:  Gradual Timing:  Constant Progression:  Worsening Chronicity:  Chronic    Past Medical History Medical History[1]   Past Surgical History Surgical History[2]    Medications Current Outpatient Medications  Medication Instructions  . apixaban  (ELIQUIS ) 5 mg (74 tabs) DsPk dose pack Take as directed on package: start with two-5mg  tablets twice daily for 7 days. On day 8, switch to one-5mg  tablet twice daily.  . clindamycin  (CLEOCIN ) 300 mg, oral, 3 times daily  . dicyclomine  (BENTYL ) 10 mg, 3 times daily  . escitalopram (LEXAPRO) 20 mg, Daily  . gabapentin 100 mg tab 1 tablet 3 TIMES DAILY (route: oral)  . glucosamine sulfate 500 mg tablet Take  by mouth.  . HYDROcodone -acetaminophen  (NORCO) 5-325 mg per tablet 1-2 tablets  . omeprazole  (PRILOSEC ) 10 mg, Daily  . ondansetron  (ZOFRAN ) 4 mg, Every 8 hours PRN  . tamsulosin  (FLOMAX ) 0.4 mg, Daily  . turm-ging-bos-yuc-wil-cham-hor (Tumersaid) 100-100-100-125 mg tab Take by mouth.      Allergies Allergies[3]   Family History Family History[4]   Social History Social History   Socioeconomic History  . Marital status:  Widowed    Spouse name: Not on file  . Number of children: Not on file  . Years of education: Not on file  . Highest education level: Not on file  Occupational History  . Not on file  Tobacco Use  . Smoking status: Former    Current packs/day: 0.00    Types: Cigarettes    Quit date: 10/18/2023    Years since quitting: 0.4  . Smokeless tobacco: Never  Vaping Use  . Vaping status: Never Used  Substance and Sexual Activity  . Alcohol use: Yes    Comment: Occ.  . Drug use: Never  . Sexual activity: Not on file  Other Topics Concern  . Not on file  Social History Narrative  . Not on file   Social Drivers of Health   Food Insecurity: Low Risk  (01/14/2024)   Food vital sign   . Within the past 12 months, you worried that your food would run out before you got money to buy more: Never true   . Within the past 12 months, the food you bought just didn't last and you didn't have money to get more: Never true  Transportation Needs: No Transportation Needs (01/14/2024)   Transportation   . In the past 12 months, has lack of reliable transportation kept you from medical appointments, meetings, work or from getting things needed for daily living? : No  Safety: Low Risk  (01/14/2024)   Safety   . How often does anyone, including family and friends, physically hurt you?: Never   . How often does  anyone, including family and friends, insult or talk down to you?: Never   . How often does anyone, including family and friends, threaten you with harm?: Never   . How often does anyone, including family and friends, scream or curse at you?: Never  Living Situation: Low Risk  (01/14/2024)   Living Situation   . What is your living situation today?: I have a steady place to live   . Think about the place you live. Do you have problems with any of the following? Choose all that apply:: None/None on this list      Review of Systems  Review of Systems  Musculoskeletal:  Positive for neck pain.   Hematological:  Positive for adenopathy.     Physical Exam   ED Triage Vitals  Temp 04/06/24 1404 97.9 F (36.6 C)  Heart Rate 04/06/24 1404 70  Resp 04/06/24 1404 18  BP 04/06/24 1404 138/88  MAP (mmHg) 04/06/24 1807 99  SpO2 04/06/24 1404 94 %  O2 Device 04/06/24 1404 None (Room air)  O2 Flow Rate (L/min) --   Weight 04/06/24 1404 97.1 kg (214 lb)    Physical Exam Constitutional:      General: He is not in acute distress.    Appearance: He is well-developed. He is ill-appearing.  HENT:     Head: Atraumatic.     Mouth/Throat:     Mouth: Mucous membranes are moist.  Eyes:     Extraocular Movements: Extraocular movements intact.  Neck:     Comments: Multiple areas of swollen, tender nodes throughout the bilateral and posterior neck. Cardiovascular:     Rate and Rhythm: Normal rate and regular rhythm.  Pulmonary:     Effort: Pulmonary effort is normal. No respiratory distress.  Abdominal:     General: Abdomen is flat. There is no distension.  Musculoskeletal:        General: No deformity.     Cervical back: Normal range of motion.  Lymphadenopathy:     Cervical: Cervical adenopathy present.  Skin:    General: Skin is warm and dry.  Neurological:     Mental Status: He is alert and oriented to person, place, and time. Mental status is at baseline.  Psychiatric:        Mood and Affect: Mood normal.     Labs   Abnormal Labs Reviewed  COMPREHENSIVE METABOLIC PANEL - Abnormal; Notable for the following components:      Result Value   Potassium 3.3 (*)    Glucose, Random 107 (*)    Total Protein 6.0 (*)    All other components within normal limits  CBC WITH DIFFERENTIAL - Abnormal; Notable for the following components:   RBC 3.90 (*)    Hemoglobin 11.8 (*)    Hematocrit 35.9 (*)    Mean Corpuscular Hemoglobin Conc (MCHC) 32.9 (*)    All other components within normal limits    Labs independently reviewed by myself and considered in medical decision  making.  Radiology   CT Soft Tissue Neck W Contrast  Final Result by Delmar Herbert Winfred Booker Results In [place] [id number] (11/19 2114)  Addendum (preliminary) 1 of 1 by [employer] Results In [place] [id number] (11/19 2114)  Date of Service: 2024-04-06 18:23:00    -----ADDENDUM-----:    Receipt of this report by the clinical staff was confirmed with Harlene Einstein RN by Quintella Iha on Apr 06, 2024 21:13:00 EST.    Electronically signed by: Quintella Iha on 04/06/2024  09:14:10 PM   US [place]    -----ORIGINAL REPORT-----:    EXAM:  CT Neck with Intravenous Contrast.     CLINICAL HISTORY:  Swollen and tender neck nodules.    TECHNIQUE:  Axial computed tomography images of the neck with intravenous contrast.   Sagittal and coronal reformations performed.     CONTRAST:  80 mL of Omnipaque  350    COMPARISON:  None provided.    FINDINGS:  Visualization of detail in portions of the neck is limited by artifact   created by retained hardware in the left face.  There is a reconstruction plate with the screws anteriorly within the   anterior body of the mandible on the left.  The posterior aspect of the   plate is inferior to the body of the mandible with these screws not   present within the bone.  Whether or not there is a non-radiopaque   construct in this region that contains the screws is unclear.  There is no evidence of cervical adenopathy.  There is no evidence of inflammatory change in the cervical soft tissues.  There is no evidence of compromise the airway.  The epiglottis is normal thickness.  The orbital contents are unremarkable.  The visualized portions of paranasal and mastoid sinuses are unremarkable.  There is cervical spondylosis with multiple level canal and foraminal   stenosis secondary to spondylitic change.    Impression:  1.  Visualization of detail in portions of the neck is limited by artifact   created by retained hardware in the left face.  2.   Reconstruction plate in the left face with the posterior screws not   present within the bone.  Whether or not there is a non-radiopaque   construct in this region that contains the screws is unclear.  Comparison with previous imaging studies is recommended.  3.  No evidence of cervical adenopathy.  4.  Cervical spondylosis with canal and foraminal stenosis.    Electronically signed by: Almarie Mani, MD on 04/06/2024 09:03:58 PM   US [place]  Per PQRS, all CT exams are performed using one or more of the following   dose reduction techniques: automated exposure control, adjustment of the   mA and/or kV according to patient size, or use of iterative reconstruction   technique.      Final  Date of Service: 2024-04-06 18:23:00    EXAM:  CT Neck with Intravenous Contrast.     CLINICAL HISTORY:  Swollen and tender neck nodules.    TECHNIQUE:  Axial computed tomography images of the neck with intravenous contrast.   Sagittal and coronal reformations performed.     CONTRAST:  80 mL of Omnipaque  350    COMPARISON:  None provided.    FINDINGS:  Visualization of detail in portions of the neck is limited by artifact   created by retained hardware in the left face.  There is a reconstruction plate with the screws anteriorly within the   anterior body of the mandible on the left.  The posterior aspect of the   plate is inferior to the body of the mandible with these screws not   present within the bone.  Whether or not there is a non-radiopaque   construct in this region that contains the screws is unclear.  There is no evidence of cervical adenopathy.  There is no evidence of inflammatory change in the cervical soft tissues.  There is no evidence of compromise the airway.  The epiglottis is normal thickness.  The orbital contents are unremarkable.  The visualized portions of paranasal and mastoid sinuses are unremarkable.  There is cervical spondylosis with multiple level  canal and foraminal   stenosis secondary to spondylitic change.    IMPRESSION:  Impression:  1.  Visualization of detail in portions of the neck is limited by artifact   created by retained hardware in the left face.  2.  Reconstruction plate in the left face with the posterior screws not   present within the bone.  Whether or not there is a non-radiopaque   construct in this region that contains the screws is unclear.  Comparison with previous imaging studies is recommended.  3.  No evidence of cervical adenopathy.  4.  Cervical spondylosis with canal and foraminal stenosis.    Electronically signed by: Almarie Mani, MD on 04/06/2024 09:03:58 PM   US [place]  Per PQRS, all CT exams are performed using one or more of the following   dose reduction techniques: automated exposure control, adjustment of the   mA and/or kV according to patient size, or use of iterative reconstruction   technique.      CT Head WO Contrast W Quant CT Tiss Character When Performed  Final Result by Delmar Herbert Winfred Booker Results In [place] [id number] (11/19 2039)  Date of Service: 2024-04-06 18:23:00    EXAM:  CT Head Without Intravenous Contrast.     CLINICAL HISTORY:  Headache.    TECHNIQUE:  Axial computed tomography images of the head/brain without intravenous   contrast.     COMPARISON:  None provided.    FINDINGS:  There is no evidence of an acute intracranial process, intracranial   hemorrhage or mass-effect.  The ventricles are normal size.  The visualized portions of the orbits, paranasal mastoid sinuses are   unremarkable.  The bony structures are unremarkable.    IMPRESSION:  Impression:  1.  No evidence of an acute intracranial process, intracranial hemorrhage   or mass-effect.  If the patient remains symptomatic and if further imaging is required, MRI   brain may be helpful.    Electronically signed by: Almarie Mani, MD on 04/06/2024 08:39:36 PM   US [place]  Per PQRS, all CT  exams are performed using one or more of the following   dose reduction techniques: automated exposure control, adjustment of the   mA and/or kV according to patient size, or use of iterative reconstruction   technique.        Imaging independently reviewed by myself and considered in medical decision making. Imaging final read interpreted by radiology.  Procedure Note  Procedures  Medical Decision Making   Medical Decision Making Patient is a very pleasant 68 year old male presents for evaluation of ongoing neck pain and nodules.  He currently follows with oncology for his history of multiple myeloma.  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  On exam, he is chronically ill-appearing.  Vital signs however are reassuring.  He has multiple tender nodular areas throughout his neck.  This appears to be most consistent with lymphadenopathy.  There are no open wounds.  I reviewed his recent ENT and oncology notes.  An outpatient CT scan of his neck has been ordered.  He has also been having increasing headaches.  I will obtain CT head and neck to evaluate for complications from his oncologic disease or progression of his infection.  The patient's labs are overall reassuring.  CT scan shows no acute intracranial process.  The CT of his neck shows no cervical adenopathy or significant evidence of fluid collection or other infectious etiology.  This is a limited study due to the metal artifact.  Regardless, patient appears stable for discharge with close outpatient ENT follow-up as already scheduled.  I discussed this finding with the patient and his caregiver at bedside.  Return precautions discussed in detail.  He will continue the clindamycin .  I reviewed his previous cultures which did show susceptibility to the clindamycin .  Problems Addressed: Neck pain: complicated acute illness or injury  Amount and/or Complexity of Data Reviewed External Data  Reviewed: notes.    Details: Reviewed recent oncology and ENT note.  Most recent oncology note from 04/04/2024.  Reviewed previous CT of the neck in September 2025. Labs: ordered. Decision-making details documented in ED Course. Radiology: ordered and independent interpretation performed. Decision-making details documented in ED Course.  Risk Prescription drug management.     ED Clinical Impression   1. Neck pain      ED Disposition     ED Disposition  Discharge   Condition  Stable   Comment  --           Discharge Medication List as of 04/06/2024  9:41 PM        FOLLOW UP Please follow up with your ENT and Oncology team           [1] Past Medical History: Diagnosis Date  . GERD (gastroesophageal reflux disease)   . Irritable bowel syndrome   . Lactose intolerance   . Multiple myeloma (CMD)   [2] History reviewed. No pertinent surgical history. [3] Allergies Allergen Reactions  . Penicillins Other (See Comments) and Rash    rash  Tolerated Cefazolin: 08/2023, Cefazolin, Cefepime : 2022  ask  rash  . Phytosterol     Does not recall this  . Sulfur Rash  . Zoledronic  Acid     osteonecrosis of the jaw  . Clarithromycin     Unknown reaction  Can't remember reaction  . Penicillin Other (See Comments)    ask  . Sulfamethoxazole Other (See Comments)    ask  [4] Family History Problem Relation Name Age of Onset  . Diabetes Mother    . Heart disease Father    . Diabetes Sister    . Diabetes Brother

## 2024-04-06 NOTE — Telephone Encounter (Signed)
 I called Andrew Espinoza to get clarification on what kind of results he is referring to. No answer, I left a voicemail to return the office a call.

## 2024-04-06 NOTE — Telephone Encounter (Signed)
 Pt called to report that he has new area on back of his right neck that wasn't there yesterday per his caregiver, Etta (who came with him to his last office appt). He also reports his headaches won't go away. He saw the ENT provider @ [facility], who thinks the initial knot on left side of neck was most likely a swollen lymph node from infection in jaw. He sees a careers adviser in December.  They both are asking whether he should be seen in ER? I recommended he go for evaluation, as there could be spreading infection, etc. They are going to [facility] ER. Message sent to Dr Ezzard ODESSIA Eleanor Harl CARROLYN.

## 2024-04-06 NOTE — ED Triage Notes (Signed)
 Pt here for likely infection in incision from jaw surgery with some drainage from incision and swelling in surrounding lymph nodes. Pt has multiple myeloma.

## 2024-04-11 ENCOUNTER — Other Ambulatory Visit: Payer: Self-pay | Admitting: Oncology

## 2024-04-11 ENCOUNTER — Inpatient Hospital Stay: Admitting: Oncology

## 2024-04-11 ENCOUNTER — Inpatient Hospital Stay

## 2024-04-29 ENCOUNTER — Other Ambulatory Visit: Payer: Self-pay | Admitting: Hematology and Oncology

## 2024-04-29 ENCOUNTER — Other Ambulatory Visit: Payer: Self-pay

## 2024-04-29 DIAGNOSIS — M545 Low back pain, unspecified: Secondary | ICD-10-CM

## 2024-04-29 DIAGNOSIS — G8929 Other chronic pain: Secondary | ICD-10-CM

## 2024-05-04 ENCOUNTER — Other Ambulatory Visit: Payer: Self-pay

## 2024-05-04 ENCOUNTER — Telehealth: Payer: Self-pay

## 2024-05-04 NOTE — Telephone Encounter (Signed)
 Pt called to give us  an update on his jaw. He is still on antibiotics. No active draining. He saw jaw surgeon yesterday and is scheduled for surgery on 06/01/2024 @ [facility]. Message sent to Dr Ezzard, Eleanor P,NP, and Kaitlyn Schomburg,RPH.

## 2024-05-08 ENCOUNTER — Other Ambulatory Visit: Payer: Self-pay

## 2024-05-16 ENCOUNTER — Other Ambulatory Visit: Payer: Self-pay

## 2024-05-16 DIAGNOSIS — I82401 Acute embolism and thrombosis of unspecified deep veins of right lower extremity: Secondary | ICD-10-CM

## 2024-05-16 DIAGNOSIS — M7989 Other specified soft tissue disorders: Secondary | ICD-10-CM

## 2024-05-16 DIAGNOSIS — G8929 Other chronic pain: Secondary | ICD-10-CM

## 2024-05-16 MED ORDER — APIXABAN 5 MG PO TABS: 5.0000 mg | ORAL_TABLET | Freq: Two times a day (BID) | ORAL | 0 refills | Status: DC

## 2024-05-16 MED ORDER — HYDROCODONE-ACETAMINOPHEN 5-325 MG PO TABS: 1.0000 | ORAL_TABLET | ORAL | 0 refills | Status: DC | PRN

## 2024-05-18 ENCOUNTER — Other Ambulatory Visit: Payer: Self-pay

## 2024-05-27 ENCOUNTER — Other Ambulatory Visit: Payer: Self-pay

## 2024-05-27 DIAGNOSIS — G8929 Other chronic pain: Secondary | ICD-10-CM

## 2024-05-28 ENCOUNTER — Other Ambulatory Visit: Payer: Self-pay

## 2024-05-31 NOTE — Discharge Summary (Signed)
 ------------------------------------------------------------------------------- Attestation signed by Francyne Soles, MD at 05/31/2024  8:49 AM I reviewed the patient's status and agree with the plan of care as documented by the resident.   Electronically signed by: Francyne Soles, MD 05/31/2024 8:49 AM  -------------------------------------------------------------------------------  Otolaryngology Discharge Summary  Patient ID: Andrew Espinoza [id number] 69 y.o. 22-Nov-1955  Admit date: 05/30/2024 Admitting Physician: Francyne Soles, MD Admission Condition: good Admission Diagnoses:   Present on Admission:  Wound infection   Discharge date and time: 05/31/2024 at approximately 8:23 AM Discharge Physician: Francyne Soles, MD  Discharged Condition: good Discharge Diagnoses:  Principal Problem:   Pyogenic inflammation of bone (CMD) Active Problems:   Wound infection Resolved Problems:   * No resolved hospital problems. *  Procedures/Surgeries performed during hospitalization:  REMOVAL HARDWARE MANDIBLE, local tissue rearrangement (Left: Neck)  Indication for Admission: Post-operative observation   Hospital Course:  [name] was taken to the operating room on 05/30/2024 where he underwent the above mentioned procedure(s) without complication. He did well postoperatively and was monitored in a regular floor bed after being observed for a short while in PACU and deemed stable for transfer.  He remained afebrile and hemodynamically stable throughout hospitalization. He was transitioned to a regular diet which was well tolerated without nausea or vomiting.  Pain control was achieved on PO pain medications. He continued to have a normal UOP with normal vital signs for this patient. He was alert and oriented throughout hospitalization.   At the time of discharge, the patient was afebrile, in no acute distress and vital signs were stable. New discharge medications were discussed in detail and  the patient stated understanding of use and administration. The patient verbalized understanding of all discharge instructions and therefore was released.   Significant Diagnostic Studies:   As above  Treatments: Current Medications[1]  Medications Discontinued During This Encounter  Medication Reason   sterile water irrigation solution Patient Discharge   bacitracin ointment packet Patient Discharge   sodium chloride  (irrigation) (NS) 0.9 % 1,000 mL with clindamycin  (CLEOCIN ) 900 mg Patient Discharge   lidocaine -EPINEPHrine  (XYLOCAINE  W/EPI) 1 %-1:100,000 injection Patient Discharge   naloxone (NARCAN) injection 0.1 mg Patient Transfer   naloxone (NARCAN) injection 0.4 mg Patient Transfer   naloxone (NARCAN) injection 0.2 mg Patient Transfer   fentaNYL  (SUBLIMAZE ) injection 25 mcg Patient Transfer   haloperidol lactate (HALDOL) injection 1 mg Patient Transfer   droPERidol (INAPSINE) injection 0.625 mg Patient Transfer   apixaban  (ELIQUIS ) tablet 5 mg    enoxaparin (LOVENOX) syringe 40 mg Duplicate order   oxyCODONE  (ROXICODONE ) immediate release tablet 5 mg    acetaminophen  (TYLENOL ) tablet 1,000 mg      Discharge Exam: AAO, NAD  Normal work of breathing   Extremities warm, well perfused.   Abdomen soft, non-distended appropriately tender  Incision(s) to neck cdi w/ staples in place. No evidence of hematoma or fluid collection Moving all extremities equally, no gross deficits.  Disposition: home  Discharge Medications and Instructions:  Discharge Medications:   Medication List     START taking these medications    acetaminophen  325 mg tablet Commonly known as: TYLENOL  Take 2 tablets (650 mg total) by mouth every 6 (six) hours for 10 days.   celecoxib 200 mg capsule Commonly known as: CeleBREX Take 1 capsule (200 mg total) by mouth 2 (two) times a day.   methocarbamoL  500 mg tablet Commonly known as: ROBAXIN  Take 1 tablet (500 mg total) by mouth 4  (four) times a day for  5 days.       CHANGE how you take these medications    * HYDROcodone -acetaminophen  5-325 mg per tablet Commonly known as: NORCO Take 1 tablet by mouth every 4 (four) hours as needed for mild pain (1-3). What changed: Another medication with the same name was added. Make sure you understand how and when to take each.   * HYDROcodone -acetaminophen  10-325 mg per tablet Commonly known as: NORCO Take 1 tablet by mouth every 6 (six) hours as needed for severe pain (7-10). What changed: You were already taking a medication with the same name, and this prescription was added. Make sure you understand how and when to take each.      * * This list has 2 medication(s) that are the same as other medications prescribed for you. Read the directions carefully, and ask your doctor or other care provider to review them with you.          CONTINUE taking these medications    acyclovir  400 mg tablet Commonly known as: ZOVIRAX  Take 400 mg by mouth daily.   apixaban  5 mg Tab Commonly known as: ELIQUIS  Take 5 mg by mouth 2 (two) times a day.   clindamycin  300 mg capsule Commonly known as: CLEOCIN  Take 300 mg by mouth 3 (three) times a day.   clotrimazole-betamethasone 1-0.05 % cream Commonly known as: LOTRISONE Apply 1 Application topically 2 (two) times a day.   dexAMETHasone  4 mg tablet Commonly known as: DECADRON  Take by mouth.   dicyclomine  10 mg capsule Commonly known as: BENTYL  Take 10 mg by mouth every 6 (six) hours as needed (IBS).   diphenhydrAMINE  25 mg capsule Commonly known as: BENADRYL  Take 25 mg by mouth daily as needed for allergies.   escitalopram 10 mg tablet Commonly known as: LEXAPRO Take 10 mg by mouth daily. as directed   esomeprazole 40 mg DR capsule Commonly known as: NexIUM Take 40 mg by mouth every morning before breakfast. for gerd   gabapentin 100 mg Tab Take 100 mg by mouth 3 (three) times a day.   IMODIUM  A-D ORAL Take 2  mg by mouth daily as needed.   Klor-Con  M20 20 mEq ER tablet Generic drug: potassium chloride  Take 20 mEq by mouth daily.   lenalidomide  10 mg Cap Commonly known as: REVLIMID  Take 10 mg by mouth daily. take for 21 days on, 7 days off; repeat every 28 days   ondansetron  4 mg tablet Commonly known as: ZOFRAN  Take 4 mg by mouth every 8 (eight) hours as needed for nausea or vomiting.         Where to Get Your Medications     These medications were sent to [facility] - [place], [place] - [address] - PHONE: [phone] - FAX: [phone]  [address] [place] [address]    Phone: [phone]  celecoxib 200 mg capsule HYDROcodone -acetaminophen  10-325 mg per tablet methocarbamoL  500 mg tablet    You can get these medications from any pharmacy   You don't need a prescription for these medications acetaminophen  325 mg tablet     See AVS for patient instructions.    Scheduled Future Appointments       Provider Department Dept Phone Center   06/16/2024 1:30 PM Robin A Rouchard-Plasser [facility] [place] [facility] [phone] [facility] [place]       The patient was instructed to call if he develops a fever greater than  101.5, any drainage from the incision (when applicable), redness extending from his incision, unable to void, or any other questions or concerns.  Electronically signed by: Sid Almarie Snowman, MD 05/31/2024 8:23 AM       [1]  Current Facility-Administered Medications:    acetaminophen  (TYLENOL ) tablet 650 mg, 650 mg, oral, Q6H, Hayley Almarie Snowman, MD   apixaban  (ELIQUIS ) tablet 5 mg, 5 mg, oral, BID, John Joseph Sykes, MD, 5 mg at 05/31/24 9462   celecoxib (CeleBREX) capsule 200 mg, 200 mg, oral, BID, John Joseph Sykes, MD, 200 mg at 05/31/24 9462   clindamycin  (CLEOCIN ) capsule 300 mg, 300 mg, oral, TID, Sid Almarie Snowman, MD, 300 mg at 05/31/24 9462   dextrose  (D50W) 50 % injection 12.5 g, 12.5  g, intravenous, PRN, Norleen Fairy Munroe, MD   dextrose  (TRUEPLUS GLUCOSE) 15 gram/32 mL oral gel 15 g, 15 g, oral, PRN, John Joseph Sykes, MD   dicyclomine  (BENTYL ) capsule 10 mg, 10 mg, oral, Q6H PRN, Sid Almarie Snowman, MD   diphenhydrAMINE  (BENADRYL ) tablet/capsule 25 mg, 25 mg, oral, Daily PRN, Sid Almarie Snowman, MD   escitalopram (LEXAPRO) tablet 10 mg, 10 mg, oral, Daily, Hayley Almarie Snowman, MD, 10 mg at 05/31/24 0537   HYDROcodone -acetaminophen  (NORCO) 5-325 mg per tablet 1 tablet, 1 tablet, oral, Q6H PRN, Sid Almarie Snowman, MD, 1 tablet at 05/31/24 0817   methocarbamoL  (ROBAXIN ) tablet 500 mg, 500 mg, oral, 4x Daily, Sid Almarie Snowman, MD, 500 mg at 05/31/24 0216   omeprazole  (PriLOSEC ) DR capsule 20 mg, 20 mg, oral, Daily 0600, Hayley Almarie Snowman, MD, 20 mg at 05/31/24 0537   ondansetron  (ZOFRAN ) injection 4 mg, 4 mg, intravenous, Q6H PRN, John Joseph Sykes, MD   ondansetron  (ZOFRAN -ODT) disintegrating tablet 4 mg, 4 mg, oral, Q6H PRN, John Joseph Sykes, MD   potassium chloride  (KLOR-CON ) packet 20 mEq, 20 mEq, oral, Daily, Sid Almarie Snowman, MD, 20 mEq at 05/31/24 9462 *Some images could not be shown.

## 2024-06-09 ENCOUNTER — Other Ambulatory Visit: Payer: Self-pay

## 2024-06-09 DIAGNOSIS — I82401 Acute embolism and thrombosis of unspecified deep veins of right lower extremity: Secondary | ICD-10-CM

## 2024-06-09 DIAGNOSIS — M868X8 Other osteomyelitis, other site: Secondary | ICD-10-CM

## 2024-06-09 DIAGNOSIS — M7989 Other specified soft tissue disorders: Secondary | ICD-10-CM

## 2024-06-09 DIAGNOSIS — Z9889 Other specified postprocedural states: Secondary | ICD-10-CM

## 2024-06-09 MED ORDER — APIXABAN 5 MG PO TABS: 5.0000 mg | ORAL_TABLET | Freq: Two times a day (BID) | ORAL | 0 refills | Status: AC

## 2024-06-09 MED ORDER — HYDROCODONE-ACETAMINOPHEN 10-325 MG PO TABS: 1.0000 | ORAL_TABLET | Freq: Four times a day (QID) | ORAL | 0 refills | Status: DC | PRN

## 2024-06-09 NOTE — Telephone Encounter (Signed)
 Pt called to let us  know he has had 2 surgeries since seeing us  last. He has 2 kidney stones removed. The jaw surgery was done and they took out the plate in my jaw. I only have half a jaw now on that side. Pt of course is not on Revlimid . He has requested hydrocodone  refill - 10mg /325 tabs 1 tab q 6 hr PRN severe pain. I only want 30 count please.Most of the time I only take 2 a day . He had been taking hydrocodone  5/325 prior to sx. He also needs refill of Eliquis .

## 2024-06-11 ENCOUNTER — Other Ambulatory Visit: Payer: Self-pay

## 2024-06-14 NOTE — Telephone Encounter (Signed)
 Scheduling- patient canceled previous consult visit on 06/08/24 with Dr. Leeann. Can you please reach back out to patient to reschedule.   Thanks

## 2024-06-16 ENCOUNTER — Other Ambulatory Visit: Payer: Self-pay

## 2024-06-23 NOTE — Telephone Encounter (Signed)
 Disregard, previous phone note open in regards to scheduling

## 2024-06-23 NOTE — Telephone Encounter (Signed)
 Spoke with patient's caregiver, Andrew Espinoza. They had to reschedule Andrew Espinoza POV again today due to weather. She has some concerns about his jaw  He is s/p hardware removal from his mandible and debridement with Dr. Murry on 05/30/24  Andrew Espinoza says today the area around the incision feels and looks more firm and slightly more red. They have been cleaning the incision with 1/2 hydrogen peroxide 1/2 water but he still has some crusting along the staples. She says he did pick at a scabbed area today that had some serosanguinous drainage. It did not continue bleeding.  She denies any fever. Swallowing and breathing both at baseline   Otherwise doing ok-more tired than normal but she says this may be related to his cancer. He is waiting to restart chemo treatment until his staples are removed.  Andrew Espinoza is going to send photos to the triage email for review.   The patient does not answer his phone, please call Andrew Espinoza back at [phone] and she will relay the message to the patient and give directives.

## 2024-06-23 NOTE — Telephone Encounter (Signed)
 SABRA

## 2024-06-23 NOTE — Telephone Encounter (Signed)
 Schedulers- Patient canceled 1/21 appt with Dr. Leeann and has not contacted clinic to reschedule. Can you please reach out to patient to reschedule consult appointment?   Thanks

## 2024-06-24 ENCOUNTER — Other Ambulatory Visit: Payer: Self-pay | Admitting: Hematology and Oncology

## 2024-06-24 DIAGNOSIS — M868X8 Other osteomyelitis, other site: Secondary | ICD-10-CM

## 2024-06-24 DIAGNOSIS — R6884 Jaw pain: Secondary | ICD-10-CM

## 2024-06-24 DIAGNOSIS — C9000 Multiple myeloma not having achieved remission: Secondary | ICD-10-CM

## 2024-06-24 DIAGNOSIS — Z9889 Other specified postprocedural states: Secondary | ICD-10-CM
